# Patient Record
Sex: Female | Born: 1970 | Race: White | Hispanic: No | Marital: Married | Smoking: Never smoker
Health system: Southern US, Community
[De-identification: ages and names within clinical notes are randomized; demographics above are authoritative.]

## PROBLEM LIST (undated history)

## (undated) DIAGNOSIS — Z800 Family history of malignant neoplasm of digestive organs: Secondary | ICD-10-CM

## (undated) DIAGNOSIS — C541 Malignant neoplasm of endometrium: Secondary | ICD-10-CM

## (undated) DIAGNOSIS — C4490 Unspecified malignant neoplasm of skin, unspecified: Secondary | ICD-10-CM

## (undated) DIAGNOSIS — I10 Essential (primary) hypertension: Secondary | ICD-10-CM

## (undated) DIAGNOSIS — F419 Anxiety disorder, unspecified: Secondary | ICD-10-CM

## (undated) DIAGNOSIS — C439 Malignant melanoma of skin, unspecified: Secondary | ICD-10-CM

## (undated) DIAGNOSIS — K589 Irritable bowel syndrome without diarrhea: Secondary | ICD-10-CM

## (undated) DIAGNOSIS — Z803 Family history of malignant neoplasm of breast: Secondary | ICD-10-CM

## (undated) DIAGNOSIS — I739 Peripheral vascular disease, unspecified: Secondary | ICD-10-CM

## (undated) DIAGNOSIS — G4730 Sleep apnea, unspecified: Secondary | ICD-10-CM

## (undated) DIAGNOSIS — M1990 Unspecified osteoarthritis, unspecified site: Secondary | ICD-10-CM

## (undated) DIAGNOSIS — Z8042 Family history of malignant neoplasm of prostate: Secondary | ICD-10-CM

## (undated) DIAGNOSIS — M4800 Spinal stenosis, site unspecified: Secondary | ICD-10-CM

## (undated) DIAGNOSIS — Z808 Family history of malignant neoplasm of other organs or systems: Secondary | ICD-10-CM

## (undated) DIAGNOSIS — Z801 Family history of malignant neoplasm of trachea, bronchus and lung: Secondary | ICD-10-CM

## (undated) HISTORY — DX: Family history of malignant neoplasm of other organs or systems: Z80.8

## (undated) HISTORY — PX: DENTAL SURGERY: SHX609

## (undated) HISTORY — DX: Spinal stenosis, site unspecified: M48.00

## (undated) HISTORY — DX: Unspecified malignant neoplasm of skin, unspecified: C44.90

## (undated) HISTORY — DX: Essential (primary) hypertension: I10

## (undated) HISTORY — DX: Malignant melanoma of skin, unspecified: C43.9

## (undated) HISTORY — DX: Family history of malignant neoplasm of digestive organs: Z80.0

## (undated) HISTORY — DX: Family history of malignant neoplasm of breast: Z80.3

## (undated) HISTORY — DX: Family history of malignant neoplasm of trachea, bronchus and lung: Z80.1

## (undated) HISTORY — DX: Family history of malignant neoplasm of prostate: Z80.42

## (undated) HISTORY — DX: Irritable bowel syndrome, unspecified: K58.9

---

## 2001-09-07 ENCOUNTER — Other Ambulatory Visit: Admission: RE | Admit: 2001-09-07 | Discharge: 2001-09-07 | Payer: Self-pay | Admitting: Obstetrics and Gynecology

## 2003-11-08 ENCOUNTER — Other Ambulatory Visit: Admission: RE | Admit: 2003-11-08 | Discharge: 2003-11-08 | Payer: Self-pay | Admitting: Obstetrics and Gynecology

## 2014-07-23 ENCOUNTER — Other Ambulatory Visit: Payer: Self-pay | Admitting: Obstetrics and Gynecology

## 2014-07-23 DIAGNOSIS — R928 Other abnormal and inconclusive findings on diagnostic imaging of breast: Secondary | ICD-10-CM

## 2014-08-01 ENCOUNTER — Ambulatory Visit
Admission: RE | Admit: 2014-08-01 | Discharge: 2014-08-01 | Disposition: A | Discharge status: Home / Self Care | Payer: Federal, State, Local not specified - PPO | Source: Ambulatory Visit | Attending: Gynecology | Admitting: Gynecology

## 2014-08-01 ENCOUNTER — Encounter (INDEPENDENT_AMBULATORY_CARE_PROVIDER_SITE_OTHER): Payer: Self-pay

## 2014-08-01 ENCOUNTER — Ambulatory Visit: Payer: Self-pay

## 2014-08-01 ENCOUNTER — Ambulatory Visit: Payer: Federal, State, Local not specified - PPO

## 2014-08-01 ENCOUNTER — Other Ambulatory Visit: Payer: Self-pay | Admitting: Gynecology

## 2014-08-01 DIAGNOSIS — R928 Other abnormal and inconclusive findings on diagnostic imaging of breast: Secondary | ICD-10-CM

## 2015-01-23 ENCOUNTER — Other Ambulatory Visit: Payer: Self-pay | Admitting: Gynecology

## 2015-01-23 DIAGNOSIS — N6320 Unspecified lump in the left breast, unspecified quadrant: Secondary | ICD-10-CM

## 2015-02-05 ENCOUNTER — Other Ambulatory Visit: Payer: Federal, State, Local not specified - PPO

## 2015-02-13 ENCOUNTER — Ambulatory Visit
Admission: RE | Admit: 2015-02-13 | Discharge: 2015-02-13 | Disposition: A | Discharge status: Home / Self Care | Payer: Federal, State, Local not specified - PPO | Source: Ambulatory Visit | Attending: Gynecology | Admitting: Gynecology

## 2015-02-13 DIAGNOSIS — N6320 Unspecified lump in the left breast, unspecified quadrant: Secondary | ICD-10-CM

## 2015-02-20 ENCOUNTER — Other Ambulatory Visit: Payer: Self-pay | Admitting: Obstetrics and Gynecology

## 2015-02-20 DIAGNOSIS — N6320 Unspecified lump in the left breast, unspecified quadrant: Secondary | ICD-10-CM

## 2015-03-06 ENCOUNTER — Ambulatory Visit
Admission: RE | Admit: 2015-03-06 | Discharge: 2015-03-06 | Disposition: A | Discharge status: Home / Self Care | Payer: Federal, State, Local not specified - PPO | Source: Ambulatory Visit | Attending: Obstetrics and Gynecology | Admitting: Obstetrics and Gynecology

## 2015-03-06 DIAGNOSIS — N6320 Unspecified lump in the left breast, unspecified quadrant: Secondary | ICD-10-CM

## 2015-03-06 HISTORY — PX: BREAST BIOPSY: SHX20

## 2015-07-15 DIAGNOSIS — L812 Freckles: Secondary | ICD-10-CM | POA: Diagnosis not present

## 2015-07-15 DIAGNOSIS — L821 Other seborrheic keratosis: Secondary | ICD-10-CM | POA: Diagnosis not present

## 2015-07-15 DIAGNOSIS — D485 Neoplasm of uncertain behavior of skin: Secondary | ICD-10-CM | POA: Diagnosis not present

## 2015-07-15 DIAGNOSIS — Z85820 Personal history of malignant melanoma of skin: Secondary | ICD-10-CM | POA: Diagnosis not present

## 2015-07-15 DIAGNOSIS — D1801 Hemangioma of skin and subcutaneous tissue: Secondary | ICD-10-CM | POA: Diagnosis not present

## 2015-08-01 DIAGNOSIS — D485 Neoplasm of uncertain behavior of skin: Secondary | ICD-10-CM | POA: Diagnosis not present

## 2015-08-07 DIAGNOSIS — L080 Pyoderma: Secondary | ICD-10-CM | POA: Diagnosis not present

## 2015-08-15 DIAGNOSIS — L905 Scar conditions and fibrosis of skin: Secondary | ICD-10-CM | POA: Diagnosis not present

## 2015-12-11 DIAGNOSIS — I10 Essential (primary) hypertension: Secondary | ICD-10-CM | POA: Diagnosis not present

## 2015-12-11 DIAGNOSIS — Z23 Encounter for immunization: Secondary | ICD-10-CM | POA: Diagnosis not present

## 2015-12-17 DIAGNOSIS — I10 Essential (primary) hypertension: Secondary | ICD-10-CM | POA: Diagnosis not present

## 2015-12-22 DIAGNOSIS — L905 Scar conditions and fibrosis of skin: Secondary | ICD-10-CM | POA: Diagnosis not present

## 2015-12-22 DIAGNOSIS — D2271 Melanocytic nevi of right lower limb, including hip: Secondary | ICD-10-CM | POA: Diagnosis not present

## 2016-01-29 ENCOUNTER — Other Ambulatory Visit: Payer: Self-pay | Admitting: Gynecology

## 2016-01-29 DIAGNOSIS — N63 Unspecified lump in unspecified breast: Secondary | ICD-10-CM

## 2016-02-12 ENCOUNTER — Other Ambulatory Visit: Payer: Federal, State, Local not specified - PPO

## 2016-02-16 DIAGNOSIS — D225 Melanocytic nevi of trunk: Secondary | ICD-10-CM | POA: Diagnosis not present

## 2016-02-16 DIAGNOSIS — Z85820 Personal history of malignant melanoma of skin: Secondary | ICD-10-CM | POA: Diagnosis not present

## 2016-02-16 DIAGNOSIS — D2271 Melanocytic nevi of right lower limb, including hip: Secondary | ICD-10-CM | POA: Diagnosis not present

## 2016-02-16 DIAGNOSIS — L821 Other seborrheic keratosis: Secondary | ICD-10-CM | POA: Diagnosis not present

## 2016-02-16 DIAGNOSIS — D2371 Other benign neoplasm of skin of right lower limb, including hip: Secondary | ICD-10-CM | POA: Diagnosis not present

## 2016-02-18 ENCOUNTER — Other Ambulatory Visit: Payer: Self-pay | Admitting: Gynecology

## 2016-02-18 ENCOUNTER — Ambulatory Visit
Admission: RE | Admit: 2016-02-18 | Discharge: 2016-02-18 | Disposition: A | Discharge status: Home / Self Care | Payer: Federal, State, Local not specified - PPO | Source: Ambulatory Visit | Attending: Gynecology | Admitting: Gynecology

## 2016-02-18 DIAGNOSIS — N6320 Unspecified lump in the left breast, unspecified quadrant: Secondary | ICD-10-CM

## 2016-02-18 DIAGNOSIS — N63 Unspecified lump in unspecified breast: Secondary | ICD-10-CM

## 2016-03-02 ENCOUNTER — Other Ambulatory Visit: Payer: Federal, State, Local not specified - PPO

## 2016-03-03 ENCOUNTER — Other Ambulatory Visit: Payer: Self-pay | Admitting: Gynecology

## 2016-03-03 DIAGNOSIS — N6320 Unspecified lump in the left breast, unspecified quadrant: Secondary | ICD-10-CM

## 2016-03-04 ENCOUNTER — Ambulatory Visit
Admission: RE | Admit: 2016-03-04 | Discharge: 2016-03-04 | Disposition: A | Discharge status: Home / Self Care | Payer: Federal, State, Local not specified - PPO | Source: Ambulatory Visit | Attending: Gynecology | Admitting: Gynecology

## 2016-03-04 DIAGNOSIS — N6320 Unspecified lump in the left breast, unspecified quadrant: Secondary | ICD-10-CM | POA: Diagnosis not present

## 2016-03-04 DIAGNOSIS — N62 Hypertrophy of breast: Secondary | ICD-10-CM | POA: Diagnosis not present

## 2016-03-04 HISTORY — PX: BREAST BIOPSY: SHX20

## 2016-07-15 DIAGNOSIS — K080 Exfoliation of teeth due to systemic causes: Secondary | ICD-10-CM | POA: Diagnosis not present

## 2016-08-26 DIAGNOSIS — D485 Neoplasm of uncertain behavior of skin: Secondary | ICD-10-CM | POA: Diagnosis not present

## 2016-09-07 DIAGNOSIS — K080 Exfoliation of teeth due to systemic causes: Secondary | ICD-10-CM | POA: Diagnosis not present

## 2016-09-16 DIAGNOSIS — J029 Acute pharyngitis, unspecified: Secondary | ICD-10-CM | POA: Diagnosis not present

## 2016-09-23 DIAGNOSIS — D1801 Hemangioma of skin and subcutaneous tissue: Secondary | ICD-10-CM | POA: Diagnosis not present

## 2016-09-23 DIAGNOSIS — L821 Other seborrheic keratosis: Secondary | ICD-10-CM | POA: Diagnosis not present

## 2016-09-23 DIAGNOSIS — L814 Other melanin hyperpigmentation: Secondary | ICD-10-CM | POA: Diagnosis not present

## 2016-09-23 DIAGNOSIS — D239 Other benign neoplasm of skin, unspecified: Secondary | ICD-10-CM | POA: Diagnosis not present

## 2016-12-01 DIAGNOSIS — R609 Edema, unspecified: Secondary | ICD-10-CM | POA: Diagnosis not present

## 2016-12-01 DIAGNOSIS — G629 Polyneuropathy, unspecified: Secondary | ICD-10-CM | POA: Diagnosis not present

## 2016-12-01 DIAGNOSIS — Z23 Encounter for immunization: Secondary | ICD-10-CM | POA: Diagnosis not present

## 2016-12-01 DIAGNOSIS — R079 Chest pain, unspecified: Secondary | ICD-10-CM | POA: Diagnosis not present

## 2016-12-09 ENCOUNTER — Other Ambulatory Visit: Payer: Self-pay | Admitting: Gynecology

## 2016-12-09 DIAGNOSIS — N63 Unspecified lump in unspecified breast: Secondary | ICD-10-CM

## 2017-01-07 ENCOUNTER — Other Ambulatory Visit: Payer: Federal, State, Local not specified - PPO

## 2017-01-13 ENCOUNTER — Ambulatory Visit
Admission: RE | Admit: 2017-01-13 | Discharge: 2017-01-13 | Disposition: A | Discharge status: Home / Self Care | Payer: Federal, State, Local not specified - PPO | Source: Ambulatory Visit | Attending: Gynecology | Admitting: Gynecology

## 2017-01-13 DIAGNOSIS — N63 Unspecified lump in unspecified breast: Secondary | ICD-10-CM

## 2017-01-13 DIAGNOSIS — R922 Inconclusive mammogram: Secondary | ICD-10-CM | POA: Diagnosis not present

## 2017-01-13 DIAGNOSIS — N6489 Other specified disorders of breast: Secondary | ICD-10-CM | POA: Diagnosis not present

## 2017-01-27 DIAGNOSIS — E538 Deficiency of other specified B group vitamins: Secondary | ICD-10-CM | POA: Diagnosis not present

## 2017-01-27 DIAGNOSIS — R748 Abnormal levels of other serum enzymes: Secondary | ICD-10-CM | POA: Diagnosis not present

## 2017-01-27 DIAGNOSIS — G629 Polyneuropathy, unspecified: Secondary | ICD-10-CM | POA: Diagnosis not present

## 2017-02-14 DIAGNOSIS — K644 Residual hemorrhoidal skin tags: Secondary | ICD-10-CM | POA: Diagnosis not present

## 2017-02-22 DIAGNOSIS — K080 Exfoliation of teeth due to systemic causes: Secondary | ICD-10-CM | POA: Diagnosis not present

## 2017-03-25 ENCOUNTER — Other Ambulatory Visit (HOSPITAL_COMMUNITY): Payer: Self-pay | Admitting: Diagnostic Radiology

## 2017-03-25 DIAGNOSIS — Z1231 Encounter for screening mammogram for malignant neoplasm of breast: Secondary | ICD-10-CM

## 2017-03-30 DIAGNOSIS — J069 Acute upper respiratory infection, unspecified: Secondary | ICD-10-CM | POA: Diagnosis not present

## 2017-03-31 ENCOUNTER — Ambulatory Visit
Admission: RE | Admit: 2017-03-31 | Discharge: 2017-03-31 | Disposition: A | Discharge status: Home / Self Care | Payer: Federal, State, Local not specified - PPO | Source: Ambulatory Visit | Attending: Diagnostic Radiology | Admitting: Diagnostic Radiology

## 2017-03-31 DIAGNOSIS — Z1231 Encounter for screening mammogram for malignant neoplasm of breast: Secondary | ICD-10-CM

## 2017-04-06 DIAGNOSIS — J069 Acute upper respiratory infection, unspecified: Secondary | ICD-10-CM | POA: Diagnosis not present

## 2017-04-12 DIAGNOSIS — K080 Exfoliation of teeth due to systemic causes: Secondary | ICD-10-CM | POA: Diagnosis not present

## 2017-05-03 DIAGNOSIS — E538 Deficiency of other specified B group vitamins: Secondary | ICD-10-CM | POA: Diagnosis not present

## 2017-05-03 DIAGNOSIS — R609 Edema, unspecified: Secondary | ICD-10-CM | POA: Diagnosis not present

## 2017-05-17 DIAGNOSIS — J029 Acute pharyngitis, unspecified: Secondary | ICD-10-CM | POA: Diagnosis not present

## 2017-05-17 DIAGNOSIS — R609 Edema, unspecified: Secondary | ICD-10-CM | POA: Diagnosis not present

## 2017-06-08 ENCOUNTER — Ambulatory Visit (INDEPENDENT_AMBULATORY_CARE_PROVIDER_SITE_OTHER): Payer: Federal, State, Local not specified - PPO | Admitting: Neurology

## 2017-06-08 ENCOUNTER — Encounter: Payer: Self-pay | Admitting: Neurology

## 2017-06-08 ENCOUNTER — Ambulatory Visit: Payer: Federal, State, Local not specified - PPO | Admitting: Neurology

## 2017-06-08 ENCOUNTER — Encounter (INDEPENDENT_AMBULATORY_CARE_PROVIDER_SITE_OTHER): Payer: Self-pay

## 2017-06-08 DIAGNOSIS — R202 Paresthesia of skin: Secondary | ICD-10-CM

## 2017-06-08 NOTE — Procedures (Signed)
     HISTORY:  Tina Ray is a 47 year old patient with a history of some numbness in the toes of the feet bilaterally since April 2018.  Over time, the numbness has spread to include all of the toes, but does not include the foot or ankle or lower leg.  The patient denies any back pain or pain down the legs.  She has not had any balance changes.  She is being evaluated for a possible peripheral neuropathy.  NERVE CONDUCTION STUDIES:  Nerve conduction studies were performed on both lower extremities. The distal motor latencies and motor amplitudes for the peroneal and posterior tibial nerves were within normal limits. The nerve conduction velocities for these nerves were also normal. The H reflex latencies were normal. The sensory latencies for the peroneal and sural nerves were within normal limits.   EMG STUDIES:  EMG study was performed on the right lower extremity:  The tibialis anterior muscle reveals 2 to 4K motor units with full recruitment. No fibrillations or positive waves were seen. The peroneus tertius muscle reveals 2 to 4K motor units with full recruitment. No fibrillations or positive waves were seen. The medial gastrocnemius muscle reveals 1 to 3K motor units with full recruitment. No fibrillations or positive waves were seen. The vastus lateralis muscle reveals 2 to 4K motor units with full recruitment. No fibrillations or positive waves were seen. The iliopsoas muscle reveals 2 to 4K motor units with full recruitment. No fibrillations or positive waves were seen. The biceps femoris muscle (long head) reveals 2 to 4K motor units with full recruitment. No fibrillations or positive waves were seen. The lumbosacral paraspinal muscles were tested at 3 levels, and revealed no abnormalities of insertional activity at all 3 levels tested. There was good relaxation.   IMPRESSION:  Nerve conduction studies done on both lower extremities were within normal limits.  No evidence  of a peripheral neuropathy is seen.  An early peripheral neuropathy however may be missed on standard nerve conduction studies.  Clinical correlation is required.  EMG evaluation of the right lower extremity was unremarkable.  No evidence of an overlying lumbosacral radiculopathy was seen.  Jill Alexanders MD 06/08/2017 5:31 PM  [facility] [address] [place], [place] [address]  Phone [phone] Fax [phone]

## 2017-06-08 NOTE — Progress Notes (Signed)
Please refer to EMG and nerve conduction study procedure note. 

## 2017-06-10 DIAGNOSIS — H9201 Otalgia, right ear: Secondary | ICD-10-CM | POA: Diagnosis not present

## 2017-06-13 DIAGNOSIS — M7989 Other specified soft tissue disorders: Secondary | ICD-10-CM | POA: Diagnosis not present

## 2017-06-13 NOTE — Progress Notes (Signed)
        Nerve / Sites Muscle Latency Ref. Amplitude Ref. Rel Amp Segments Distance Velocity Ref. Area    ms ms mV mV %  cm m/s m/s mVms  R Peroneal - EDB     Ankle EDB 4.2 ?6.5 11.3 ?2.0 100 Ankle - EDB 9   33.5     Fib head EDB 9.3  11.5  102 Fib head - Ankle 26 51 ?44 35.2     Pop fossa EDB 11.1  10.2  88.6 Pop fossa - Fib head 10 53 ?44 32.5         Pop fossa - Ankle      L Peroneal - EDB     Ankle EDB 4.0 ?6.5 8.4 ?2.0 100 Ankle - EDB 9   29.5     Fib head EDB 9.2  8.4  99.7 Fib head - Ankle 26 50 ?44 31.0     Pop fossa EDB 11.3  8.1  96.3 Pop fossa - Fib head 10 47 ?44 29.5         Pop fossa - Ankle      R Tibial - AH     Ankle AH 4.0 ?5.8 10.6 ?4.0 100 Ankle - AH 9   32.2     Pop fossa AH 11.0  10.6  100 Pop fossa - Ankle 32 46 ?41 31.6  L Tibial - AH     Ankle AH 4.2 ?5.8 8.9 ?4.0 100 Ankle - AH 9   28.3     Pop fossa AH 11.6  6.6  74.9 Pop fossa - Ankle 32 43 ?41 28.4             SNC    Nerve / Sites Rec. Site Peak Lat Ref.  Amp Ref. Segments Distance    ms ms V V  cm  L Sural - Ankle (Calf)     Calf Ankle 3.7 ?4.4 8 ?6 Calf - Ankle 14  R Sural - Ankle (Calf)     Calf Ankle 3.9 ?4.4 10 ?6 Calf - Ankle 14  R Superficial peroneal - Ankle     Lat leg Ankle 3.6 ?4.4 8 ?6 Lat leg - Ankle 14  L Superficial peroneal - Ankle     Lat leg Ankle 3.5 ?4.4 6 ?6 Lat leg - Ankle 14              F  Wave    Nerve F Lat Ref.   ms ms  R Tibial - AH 45.9 ?56.0  L Tibial - AH 46.9 ?56.0         EMG full

## 2017-06-28 DIAGNOSIS — Z13220 Encounter for screening for lipoid disorders: Secondary | ICD-10-CM | POA: Diagnosis not present

## 2017-06-28 DIAGNOSIS — Z124 Encounter for screening for malignant neoplasm of cervix: Secondary | ICD-10-CM | POA: Diagnosis not present

## 2017-06-28 DIAGNOSIS — Z6836 Body mass index (BMI) 36.0-36.9, adult: Secondary | ICD-10-CM | POA: Diagnosis not present

## 2017-06-28 DIAGNOSIS — Z130 Encounter for screening for diseases of the blood and blood-forming organs and certain disorders involving the immune mechanism: Secondary | ICD-10-CM | POA: Diagnosis not present

## 2017-06-28 DIAGNOSIS — N921 Excessive and frequent menstruation with irregular cycle: Secondary | ICD-10-CM | POA: Diagnosis not present

## 2017-06-28 DIAGNOSIS — Z1389 Encounter for screening for other disorder: Secondary | ICD-10-CM | POA: Diagnosis not present

## 2017-06-28 DIAGNOSIS — Z131 Encounter for screening for diabetes mellitus: Secondary | ICD-10-CM | POA: Diagnosis not present

## 2017-06-28 DIAGNOSIS — Z01419 Encounter for gynecological examination (general) (routine) without abnormal findings: Secondary | ICD-10-CM | POA: Diagnosis not present

## 2017-07-04 DIAGNOSIS — Z0000 Encounter for general adult medical examination without abnormal findings: Secondary | ICD-10-CM | POA: Diagnosis not present

## 2017-07-11 DIAGNOSIS — N939 Abnormal uterine and vaginal bleeding, unspecified: Secondary | ICD-10-CM | POA: Diagnosis not present

## 2017-07-11 DIAGNOSIS — C50919 Malignant neoplasm of unspecified site of unspecified female breast: Secondary | ICD-10-CM | POA: Diagnosis not present

## 2017-07-12 ENCOUNTER — Encounter: Payer: Federal, State, Local not specified - PPO | Admitting: Neurology

## 2017-07-18 DIAGNOSIS — H9201 Otalgia, right ear: Secondary | ICD-10-CM | POA: Diagnosis not present

## 2017-07-18 DIAGNOSIS — R609 Edema, unspecified: Secondary | ICD-10-CM | POA: Diagnosis not present

## 2017-07-18 DIAGNOSIS — H6061 Unspecified chronic otitis externa, right ear: Secondary | ICD-10-CM | POA: Diagnosis not present

## 2017-07-18 DIAGNOSIS — H60501 Unspecified acute noninfective otitis externa, right ear: Secondary | ICD-10-CM | POA: Diagnosis not present

## 2017-08-01 DIAGNOSIS — H6091 Unspecified otitis externa, right ear: Secondary | ICD-10-CM | POA: Diagnosis not present

## 2017-08-01 DIAGNOSIS — H93293 Other abnormal auditory perceptions, bilateral: Secondary | ICD-10-CM | POA: Diagnosis not present

## 2017-08-10 DIAGNOSIS — H6093 Unspecified otitis externa, bilateral: Secondary | ICD-10-CM | POA: Diagnosis not present

## 2017-08-10 DIAGNOSIS — Z7289 Other problems related to lifestyle: Secondary | ICD-10-CM | POA: Diagnosis not present

## 2017-08-10 DIAGNOSIS — H6123 Impacted cerumen, bilateral: Secondary | ICD-10-CM | POA: Diagnosis not present

## 2017-08-16 DIAGNOSIS — N939 Abnormal uterine and vaginal bleeding, unspecified: Secondary | ICD-10-CM | POA: Diagnosis not present

## 2017-08-16 DIAGNOSIS — C541 Malignant neoplasm of endometrium: Secondary | ICD-10-CM | POA: Diagnosis not present

## 2017-08-16 DIAGNOSIS — C50919 Malignant neoplasm of unspecified site of unspecified female breast: Secondary | ICD-10-CM | POA: Diagnosis not present

## 2017-08-22 DIAGNOSIS — H93293 Other abnormal auditory perceptions, bilateral: Secondary | ICD-10-CM | POA: Diagnosis not present

## 2017-08-22 DIAGNOSIS — H6063 Unspecified chronic otitis externa, bilateral: Secondary | ICD-10-CM | POA: Diagnosis not present

## 2017-08-23 ENCOUNTER — Inpatient Hospital Stay: Payer: Federal, State, Local not specified - PPO | Attending: Gynecologic Oncology | Admitting: Gynecologic Oncology

## 2017-08-23 ENCOUNTER — Encounter: Payer: Self-pay | Admitting: Oncology

## 2017-08-23 ENCOUNTER — Encounter: Payer: Self-pay | Admitting: Gynecologic Oncology

## 2017-08-23 ENCOUNTER — Telehealth: Payer: Self-pay | Admitting: *Deleted

## 2017-08-23 VITALS — BP 131/78 | HR 104 | Temp 98.3°F | Resp 20 | Ht 62.5 in | Wt 204.2 lb

## 2017-08-23 DIAGNOSIS — C541 Malignant neoplasm of endometrium: Secondary | ICD-10-CM | POA: Diagnosis not present

## 2017-08-23 NOTE — Telephone Encounter (Signed)
Called and left a message for the patient with her post op appt

## 2017-08-23 NOTE — Patient Instructions (Signed)
Preparing for your Surgery  Plan for surgery on September 12, 2017 with Dr. Everitt Amber at [place] will be scheduled for a robotic assisted total hysterectomy, bilateral salpingo-oophorectomy, sentinel lymph node biopsy.   Plan on having a CT scan before surgery and we will contact you with the results.   Pre-operative Testing -You will receive a phone call from presurgical testing at [facility] to arrange for a pre-operative testing appointment before your surgery.  This appointment normally occurs one to two weeks before your scheduled surgery.   -Bring your insurance card, copy of an advanced directive if applicable, medication list  -At that visit, you will be asked to sign a consent for a possible blood transfusion in case a transfusion becomes necessary during surgery.  The need for a blood transfusion is rare but having consent is a necessary part of your care.     -You should not be taking blood thinners or aspirin at least ten days prior to surgery unless instructed by your surgeon.  Day Before Surgery at [place] will be asked to take in a light diet the day before surgery.  Avoid carbonated beverages.  You will be advised to have nothing to eat or drink after midnight the evening before.    Eat a light diet the day before surgery.  Examples including soups, broths, toast, yogurt, mashed potatoes.  Things to avoid include carbonated beverages (fizzy beverages), raw fruits and raw vegetables, or beans.   If your bowels are filled with gas, your surgeon will have difficulty visualizing your pelvic organs which increases your surgical risks.  Your role in recovery Your role is to become active as soon as directed by your doctor, while still giving yourself time to heal.  Rest when you feel tired. You will be asked to do the following in order to speed your recovery:  - Cough and breathe deeply. This helps toclear and expand your lungs and can  prevent pneumonia. You may be given a spirometer to practice deep breathing. A staff member will show you how to use the spirometer. - Do mild physical activity. Walking or moving your legs help your circulation and body functions return to normal. A staff member will help you when you try to walk and will provide you with simple exercises. Do not try to get up or walk alone the first time. - Actively manage your pain. Managing your pain lets you move in comfort. We will ask you to rate your pain on a scale of zero to 10. It is your responsibility to tell your doctor or nurse where and how much you hurt so your pain can be treated.  Special Considerations -If you are diabetic, you may be placed on insulin after surgery to have closer control over your blood sugars to promote healing and recovery.  This does not mean that you will be discharged on insulin.  If applicable, your oral antidiabetics will be resumed when you are tolerating a solid diet.  -Your final pathology results from surgery should be available by the Friday after surgery and the results will be relayed to you when available.  -Dr. Lahoma Crocker is the Surgeon that assists your GYN Oncologist with surgery.  The next day after your surgery you will either see your GYN Oncologist or Dr. Lahoma Crocker.   Blood Transfusion Information WHAT IS A BLOOD TRANSFUSION? A transfusion is the replacement of blood or some of its parts. Blood is made  up of multiple cells which provide different functions.  Red blood cells carry oxygen and are used for blood loss replacement.  White blood cells fight against infection.  Platelets control bleeding.  Plasma helps clot blood.  Other blood products are available for specialized needs, such as hemophilia or other clotting disorders. BEFORE THE TRANSFUSION  Who gives blood for transfusions?   You may be able to donate blood to be used at a later date on yourself (autologous  donation).  Relatives can be asked to donate blood. This is generally not any safer than if you have received blood from a stranger. The same precautions are taken to ensure safety when a relative's blood is donated.  Healthy volunteers who are fully evaluated to make sure their blood is safe. This is blood bank blood. Transfusion therapy is the safest it has ever been in the practice of medicine. Before blood is taken from a donor, a complete history is taken to make sure that person has no history of diseases nor engages in risky social behavior (examples are intravenous drug use or sexual activity with multiple partners). The donor's travel history is screened to minimize risk of transmitting infections, such as malaria. The donated blood is tested for signs of infectious diseases, such as HIV and hepatitis. The blood is then tested to be sure it is compatible with you in order to minimize the chance of a transfusion reaction. If you or a relative donates blood, this is often done in anticipation of surgery and is not appropriate for emergency situations. It takes many days to process the donated blood. RISKS AND COMPLICATIONS Although transfusion therapy is very safe and saves many lives, the main dangers of transfusion include:   Getting an infectious disease.  Developing a transfusion reaction. This is an allergic reaction to something in the blood you were given. Every precaution is taken to prevent this. The decision to have a blood transfusion has been considered carefully by your caregiver before blood is given. Blood is not given unless the benefits outweigh the risks.

## 2017-08-23 NOTE — H&P (View-Only) (Signed)
Consult Note: Gyn-Onc  Consult was requested by Dr. Nile Dear for the evaluation of [name] 47 y.o. female  CC:  Chief Complaint  Patient presents with  . Adenocarcinoma of endometrium [facility])    Assessment/Plan:  Ms. Tina Ray  is a 47 y.o.  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  A detailed discussion was held with the patient and her family with regard to to her endometrial cancer diagnosis. We discussed the standard management options for uterine cancer which includes surgery followed possibly by adjuvant therapy depending on the results of surgery. The options for surgical management include a hysterectomy and removal of the tubes and ovaries possibly with removal of pelvic and para-aortic lymph nodes.If feasible, a minimally invasive approach including a robotic hysterectomy or laparoscopic hysterectomy have benefits including shorter hospital stay, recovery time and better wound healing than with open surgery. The patient has been counseled about these surgical options and the risks of surgery in general including infection, bleeding, damage to surrounding structures (including bowel, bladder, ureters, nerves or vessels), and the postoperative risks of PE/ DVT, and lymphedema. I extensively reviewed the additional risks of robotic hysterectomy including possible need for conversion to open laparotomy.  I discussed positioning during surgery of trendelenberg and risks of minor facial swelling and care we take in preoperative positioning.  After counseling and consideration of her options, she desires to proceed with robotic assisted total hysterectomy with bilateral sapingo-oophorectomy and SLN biopsy.   She will be seen by anesthesia for preoperative clearance and discussion of postoperative pain management.  She was given the opportunity to ask questions, which were answered to her satisfaction, and she is agreement with the above mentioned  plan of care.  Due to the focus of serous carcinoma in the biopsy, I counseled the patient that she may require adjuvant therapy postop. We will obtain a preop CT abd/pelvis in accordance with NCCN recommendations to evaluate for gross metastatic extrauterine disease.   She was counseled regarding the sterilizing nature of hysterectomy, and that surgical menopause will be induced with BSO. We will prescribe estrogen replacement therapy postop on a prn basis.   HPI: Tina Ray is a 47 year old P0 who is seen in consultation at the request of Dr Melba Coon for grade 1 endometrial cancer.   The patient has a history of previously normal regular menstrual cycles up until December 2018 when she began experiencing intermenstrual bleeding.  She then discussed this with her OB/GYN during in May 2019 routine visit.  This prompted a transvaginal ultrasound scan which identified a 1 cm endometrial polyp.  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  The patient is nulliparous as her husband had had a prior vasectomy.  Patient has a history of 2 prior melanoma surgeries including one in her 75s.  Her family history is significant for a paternal grandmother with breast cancer at age 51, father with prostate cancer, and a maternal grandfather and 3 additional people on her mother side of the family with melanoma history.  The patient has had no prior abdominal surgeries.  She has hypertension but no diabetes mellitus.  She is overweight and weighs 204 pounds.  Current Meds:  Outpatient Encounter Medications as of 08/23/2017  Medication Sig  . acetic acid-hydrocortisone (VOSOL-HC) OTIC solution PLACE 4 DROPS INTO BOTH EARS EVERY EVENING FOR 7 DAYS THEN AS  NEEDED FOR ITCHING IN THE EAR CANALS.  Marland Kitchen acyclovir (ZOVIRAX) 400 MG tablet 1 TABLET THREE TIMES A DAY AS NEEDED ORALLY 20  . fluticasone  (FLONASE) 50 MCG/ACT nasal spray fluticasone propionate 50 mcg/actuation nasal spray,suspension  . lisinopril-hydrochlorothiazide (PRINZIDE,ZESTORETIC) 20-25 MG tablet Take 1 tablet by mouth daily.  Marland Kitchen loratadine-pseudoephedrine (CLARITIN-D 24-HOUR) 10-240 MG 24 hr tablet Take 1 tablet by mouth as needed for allergies.  . vitamin B-12 (CYANOCOBALAMIN) 250 MCG tablet Take 250 mcg by mouth as needed. B-12 250 mcg tablet. Take by oral route. Patient takes as needed.   No facility-administered encounter medications on file as of 08/23/2017.     Allergy:  Allergies  Allergen Reactions  . Erythromycin Base Hives  . Sulfa Antibiotics Hives    Social Hx:   Social History   Socioeconomic History  . Marital status: Married    Spouse name: Not on file  . Number of children: Not on file  . Years of education: Not on file  . Highest education level: Not on file  Occupational History  . Not on file  Social Needs  . Financial resource strain: Not on file  . Food insecurity:    Worry: Not on file    Inability: Not on file  . Transportation needs:    Medical: Not on file    Non-medical: Not on file  Tobacco Use  . Smoking status: Never Smoker  . Smokeless tobacco: Never Used  Substance and Sexual Activity  . Alcohol use: Yes    Alcohol/week: 1.8 oz    Types: 3 Glasses of wine per week    Comment: 2 or 3 classes of wine daily  . Drug use: Never  . Sexual activity: Not on file  Lifestyle  . Physical activity:    Days per week: Not on file    Minutes per session: Not on file  . Stress: Not on file  Relationships  . Social connections:    Talks on phone: Not on file    Gets together: Not on file    Attends religious service: Not on file    Active member of club or organization: Not on file    Attends meetings of clubs or organizations: Not on file    Relationship status: Not on file  . Intimate partner violence:    Fear of current or ex partner: Not on file    Emotionally abused:  Not on file    Physically abused: Not on file    Forced sexual activity: Not on file  Other Topics Concern  . Not on file  Social History Narrative  . Not on file    Past Surgical Hx:  Past Surgical History:  Procedure Laterality Date  . BREAST BIOPSY Left 03/04/2016  . BREAST BIOPSY Left 03/06/2015  . DENTAL SURGERY     wisedom tooth extraction    Past Medical Hx:  Past Medical History:  Diagnosis Date  . Hypertension   . IBS (irritable bowel syndrome)   . Skin cancer   . Skin cancer (melanoma) ([place])    1999    Past Gynecological History:   No LMP recorded. (Menstrual status: Irregular Periods).  Family Hx:  Family History  Problem Relation Age of Onset  . Breast cancer Paternal Grandmother   . Hypercholesterolemia Mother   . Diabetes Father   . Multiple sclerosis Father   . Bone cancer Father   . Colon cancer Father   . Prostate cancer Father   . Diabetes  Maternal Aunt   . Diabetes Maternal Uncle   . Hypertension Maternal Grandmother   . Diabetes Maternal Grandmother   . Diabetes Maternal Grandfather     Review of Systems:  Constitutional  Feels well,    ENT Normal appearing ears and nares bilaterally Skin/Breast  No rash, sores, jaundice, itching, dryness Cardiovascular  No chest pain, shortness of breath, or edema  Pulmonary  No cough or wheeze.  Gastro Intestinal  No nausea, vomitting, or diarrhoea. No bright red blood per rectum, no abdominal pain, change in bowel movement, or constipation.  Genito Urinary  No frequency, urgency, dysuria, + abnormal uterine bleeding Musculo Skeletal  No myalgia, arthralgia, joint swelling or pain  Neurologic  No weakness, numbness, change in gait,  Psychology  No depression, anxiety, insomnia.   Vitals:  Blood pressure 131/78, pulse (!) 104, temperature 98.3 F (36.8 C), temperature source Oral, resp. rate 20, height 5' 2.5" (1.588 m), weight 204 lb 3.2 oz (92.6 kg), SpO2 100 %.  Physical Exam: WD in  NAD Neck  Supple NROM, without any enlargements.  Lymph Node Survey No cervical supraclavicular or inguinal adenopathy Cardiovascular  Pulse normal rate, regularity and rhythm. S1 and S2 normal.  Lungs  Clear to auscultation bilateraly, without wheezes/crackles/rhonchi. Good air movement.  Skin  No rash/lesions/breakdown  Psychiatry  Alert and oriented to person, place, and time  Abdomen  Normoactive bowel sounds, abdomen soft, non-tender and overweight without evidence of hernia.  Back No CVA tenderness Genito Urinary  Vulva/vagina: Normal external female genitalia.   No lesions. No discharge or bleeding.  Bladder/urethra:  No lesions or masses, well supported bladder  Vagina: normal  Cervix: Normal appearing, no lesions.  Uterus:  Small, mobile, no parametrial involvement or nodularity.  Adnexa: no palpable masses. Rectal  deferred Extremities  No bilateral cyanosis, clubbing or edema.   Thereasa Solo, MD  08/23/2017, 3:29 PM

## 2017-08-23 NOTE — Progress Notes (Signed)
Consult Note: Gyn-Onc  Consult was requested by Dr. Nile Dear for the evaluation of Tina Ray 47 y.o. female  CC:  Chief Complaint  Patient presents with  . Adenocarcinoma of endometrium [facility])    Assessment/Plan:  Tina Ray  is a 47 y.o.  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  A detailed discussion was held with the patient and her family with regard to to her endometrial cancer diagnosis. We discussed the standard management options for uterine cancer which includes surgery followed possibly by adjuvant therapy depending on the results of surgery. The options for surgical management include a hysterectomy and removal of the tubes and ovaries possibly with removal of pelvic and para-aortic lymph nodes.If feasible, a minimally invasive approach including a robotic hysterectomy or laparoscopic hysterectomy have benefits including shorter hospital stay, recovery time and better wound healing than with open surgery. The patient has been counseled about these surgical options and the risks of surgery in general including infection, bleeding, damage to surrounding structures (including bowel, bladder, ureters, nerves or vessels), and the postoperative risks of PE/ DVT, and lymphedema. I extensively reviewed the additional risks of robotic hysterectomy including possible need for conversion to open laparotomy.  I discussed positioning during surgery of trendelenberg and risks of minor facial swelling and care we take in preoperative positioning.  After counseling and consideration of her options, she desires to proceed with robotic assisted total hysterectomy with bilateral sapingo-oophorectomy and SLN biopsy.   She will be seen by anesthesia for preoperative clearance and discussion of postoperative pain management.  She was given the opportunity to ask questions, which were answered to her satisfaction, and she is agreement with the above mentioned  plan of care.  Due to the focus of serous carcinoma in the biopsy, I counseled the patient that she may require adjuvant therapy postop. We will obtain a preop CT abd/pelvis in accordance with NCCN recommendations to evaluate for gross metastatic extrauterine disease.   She was counseled regarding the sterilizing nature of hysterectomy, and that surgical menopause will be induced with BSO. We will prescribe estrogen replacement therapy postop on a prn basis.   HPI: Tina Ray is a 47 year old P0 who is seen in consultation at the request of Dr Melba Coon for grade 1 endometrial cancer.   The patient has a history of previously normal regular menstrual cycles up until December 2018 when she began experiencing intermenstrual bleeding.  She then discussed this with her OB/GYN during in May 2019 routine visit.  This prompted a transvaginal ultrasound scan which identified a 1 cm endometrial polyp.  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  The patient is nulliparous as her husband had had a prior vasectomy.  Patient has a history of 2 prior melanoma surgeries including one in her 43s.  Her family history is significant for a paternal grandmother with breast cancer at age 44, father with prostate cancer, and a maternal grandfather and 3 additional people on her mother side of the family with melanoma history.  The patient has had no prior abdominal surgeries.  She has hypertension but no diabetes mellitus.  She is overweight and weighs 204 pounds.  Current Meds:  Outpatient Encounter Medications as of 08/23/2017  Medication Sig  . acetic acid-hydrocortisone (VOSOL-HC) OTIC solution PLACE 4 DROPS INTO BOTH EARS EVERY EVENING FOR 7 DAYS THEN AS  NEEDED FOR ITCHING IN THE EAR CANALS.  Marland Kitchen acyclovir (ZOVIRAX) 400 MG tablet 1 TABLET THREE TIMES A DAY AS NEEDED ORALLY 20  . fluticasone  (FLONASE) 50 MCG/ACT nasal spray fluticasone propionate 50 mcg/actuation nasal spray,suspension  . lisinopril-hydrochlorothiazide (PRINZIDE,ZESTORETIC) 20-25 MG tablet Take 1 tablet by mouth daily.  Marland Kitchen loratadine-pseudoephedrine (CLARITIN-D 24-HOUR) 10-240 MG 24 hr tablet Take 1 tablet by mouth as needed for allergies.  . vitamin B-12 (CYANOCOBALAMIN) 250 MCG tablet Take 250 mcg by mouth as needed. B-12 250 mcg tablet. Take by oral route. Patient takes as needed.   No facility-administered encounter medications on file as of 08/23/2017.     Allergy:  Allergies  Allergen Reactions  . Erythromycin Base Hives  . Sulfa Antibiotics Hives    Social Hx:   Social History   Socioeconomic History  . Marital status: Married    Spouse name: Not on file  . Number of children: Not on file  . Years of education: Not on file  . Highest education level: Not on file  Occupational History  . Not on file  Social Needs  . Financial resource strain: Not on file  . Food insecurity:    Worry: Not on file    Inability: Not on file  . Transportation needs:    Medical: Not on file    Non-medical: Not on file  Tobacco Use  . Smoking status: Never Smoker  . Smokeless tobacco: Never Used  Substance and Sexual Activity  . Alcohol use: Yes    Alcohol/week: 1.8 oz    Types: 3 Glasses of wine per week    Comment: 2 or 3 classes of wine daily  . Drug use: Never  . Sexual activity: Not on file  Lifestyle  . Physical activity:    Days per week: Not on file    Minutes per session: Not on file  . Stress: Not on file  Relationships  . Social connections:    Talks on phone: Not on file    Gets together: Not on file    Attends religious service: Not on file    Active member of club or organization: Not on file    Attends meetings of clubs or organizations: Not on file    Relationship status: Not on file  . Intimate partner violence:    Fear of current or ex partner: Not on file    Emotionally abused:  Not on file    Physically abused: Not on file    Forced sexual activity: Not on file  Other Topics Concern  . Not on file  Social History Narrative  . Not on file    Past Surgical Hx:  Past Surgical History:  Procedure Laterality Date  . BREAST BIOPSY Left 03/04/2016  . BREAST BIOPSY Left 03/06/2015  . DENTAL SURGERY     wisedom tooth extraction    Past Medical Hx:  Past Medical History:  Diagnosis Date  . Hypertension   . IBS (irritable bowel syndrome)   . Skin cancer   . Skin cancer (melanoma) ([place])    1999    Past Gynecological History:   No LMP recorded. (Menstrual status: Irregular Periods).  Family Hx:  Family History  Problem Relation Age of Onset  . Breast cancer Paternal Grandmother   . Hypercholesterolemia Mother   . Diabetes Father   . Multiple sclerosis Father   . Bone cancer Father   . Colon cancer Father   . Prostate cancer Father   . Diabetes  Maternal Aunt   . Diabetes Maternal Uncle   . Hypertension Maternal Grandmother   . Diabetes Maternal Grandmother   . Diabetes Maternal Grandfather     Review of Systems:  Constitutional  Feels well,    ENT Normal appearing ears and nares bilaterally Skin/Breast  No rash, sores, jaundice, itching, dryness Cardiovascular  No chest pain, shortness of breath, or edema  Pulmonary  No cough or wheeze.  Gastro Intestinal  No nausea, vomitting, or diarrhoea. No bright red blood per rectum, no abdominal pain, change in bowel movement, or constipation.  Genito Urinary  No frequency, urgency, dysuria, + abnormal uterine bleeding Musculo Skeletal  No myalgia, arthralgia, joint swelling or pain  Neurologic  No weakness, numbness, change in gait,  Psychology  No depression, anxiety, insomnia.   Vitals:  Blood pressure 131/78, pulse (!) 104, temperature 98.3 F (36.8 C), temperature source Oral, resp. rate 20, height 5' 2.5" (1.588 m), weight 204 lb 3.2 oz (92.6 kg), SpO2 100 %.  Physical Exam: WD in  NAD Neck  Supple NROM, without any enlargements.  Lymph Node Survey No cervical supraclavicular or inguinal adenopathy Cardiovascular  Pulse normal rate, regularity and rhythm. S1 and S2 normal.  Lungs  Clear to auscultation bilateraly, without wheezes/crackles/rhonchi. Good air movement.  Skin  No rash/lesions/breakdown  Psychiatry  Alert and oriented to person, place, and time  Abdomen  Normoactive bowel sounds, abdomen soft, non-tender and overweight without evidence of hernia.  Back No CVA tenderness Genito Urinary  Vulva/vagina: Normal external female genitalia.   No lesions. No discharge or bleeding.  Bladder/urethra:  No lesions or masses, well supported bladder  Vagina: normal  Cervix: Normal appearing, no lesions.  Uterus:  Small, mobile, no parametrial involvement or nodularity.  Adnexa: no palpable masses. Rectal  deferred Extremities  No bilateral cyanosis, clubbing or edema.   Thereasa Solo, MD  08/23/2017, 3:29 PM

## 2017-08-25 ENCOUNTER — Ambulatory Visit (HOSPITAL_COMMUNITY)
Admission: RE | Admit: 2017-08-25 | Discharge: 2017-08-25 | Disposition: A | Discharge status: Home / Self Care | Payer: Federal, State, Local not specified - PPO | Source: Ambulatory Visit | Attending: Gynecologic Oncology | Admitting: Gynecologic Oncology

## 2017-08-25 DIAGNOSIS — C541 Malignant neoplasm of endometrium: Secondary | ICD-10-CM | POA: Insufficient documentation

## 2017-08-25 MED ORDER — IOPAMIDOL (ISOVUE-300) INJECTION 61%
INTRAVENOUS | Status: AC
  Filled 2017-08-25: qty 100

## 2017-08-25 MED ORDER — IOPAMIDOL (ISOVUE-300) INJECTION 61%
100.0000 mL | Freq: Once | INTRAVENOUS | Status: AC | PRN
  Administered 2017-08-25: 100 mL via INTRAVENOUS

## 2017-08-30 ENCOUNTER — Telehealth: Payer: Self-pay

## 2017-08-30 NOTE — Telephone Encounter (Signed)
Outgoing call to patient regarding recent CT of abd / pelvis results, per Joylene John NP, "no evidence of metastatic disease elsewhere" and then she'll receive the results from pathology regarding the tissue samples from her upcoming surgery. Pt voiced understanding. Pt verbalized pleased with results so far. No other needs per pt at this time.

## 2017-09-02 DIAGNOSIS — M62838 Other muscle spasm: Secondary | ICD-10-CM | POA: Diagnosis not present

## 2017-09-05 NOTE — Patient Instructions (Addendum)
Tina Ray  09/05/2017   Your procedure is scheduled on: 09-12-17  Report to [facility] Main  Entrance    Report to Admitting at 5:30 AM    Call this number if you have problems the morning of surgery [phone]   Remember: NO SOLID FOOD AFTER MIDNIGHT THE NIGHT PRIOR TO SURGERY. NOTHING BY MOUTH EXCEPT CLEAR LIQUIDS UNTIL 3 HOURS PRIOR TO [name] SURGERY. PLEASE FINISH ENSURE DRINK PER SURGEON ORDER 3 HOURS PRIOR TO SCHEDULED SURGERY TIME WHICH NEEDS TO BE COMPLETED AT 4:30.     CLEAR LIQUID DIET   Foods Allowed                                                                     Foods Excluded  Coffee and tea, regular and decaf                             liquids that you cannot  Plain Jell-O in any flavor                                             see through such as: Fruit ices (not with fruit pulp)                                     milk, soups, orange juice  Iced Popsicles                                    All solid food Carbonated beverages, regular and diet                                    Cranberry, grape and apple juices Sports drinks like Gatorade Lightly seasoned clear broth or consume(fat free) Sugar, honey syrup  Sample Menu Breakfast                                Lunch                                     Supper Cranberry juice                    Beef broth                            Chicken broth Jell-O                                     Grape juice  Apple juice Coffee or tea                        Jell-O                                      Popsicle                                                Coffee or tea                        Coffee or tea  _____________________________________________________________________    Eat a light diet the day before surgery.  Examples including soups, broths, toast, yogurt, mashed potatoes.  Things to avoid include carbonated beverages (fizzy beverages), raw fruits  and raw vegetables, or beans.   If your bowels are filled with gas, your surgeon will have difficulty visualizing your pelvic organs which increases your surgical risks.  Take these medicines the morning of surgery with A SIP OF WATER: None                                You may not have any metal on your body including hair pins and              piercings  Do not wear jewelry, make-up, lotions, powders or perfumes, deodorant             Do not wear nail polish.  Do not shave  48 hours prior to surgery.                 Do not bring valuables to the hospital. [facility].  Contacts, dentures or bridgework may not be worn into surgery.      Patients discharged the day of surgery will not be allowed to drive home.  Name and phone number of your driver:  Special Instructions: Cough and Deep Breath Exercises              Please read over the following fact sheets you were given: _____________________________________________________________________         [facility] - Preparing for Surgery Before surgery, you can play an important role.  Because skin is not sterile, your skin needs to be as free of germs as possible.  You can reduce the number of germs on your skin by washing with CHG (chlorahexidine gluconate) soap before surgery.  CHG is an antiseptic cleaner which kills germs and bonds with the skin to continue killing germs even after washing. Please DO NOT use if you have an allergy to CHG or antibacterial soaps.  If your skin becomes reddened/irritated stop using the CHG and inform your nurse when you arrive at Short Stay. Do not shave (including legs and underarms) for at least 48 hours prior to the first CHG shower.  You may shave your face/neck. Please follow these instructions carefully:  1.  Shower with CHG Soap the night before surgery and the  morning of Surgery.  2.  If you choose to wash your hair, wash your  hair first as usual  with your  normal  shampoo.  3.  After you shampoo, rinse your hair and body thoroughly to remove the  shampoo.                           4.  Use CHG as you would any other liquid soap.  You can apply chg directly  to the skin and wash                       Gently with a scrungie or clean washcloth.  5.  Apply the CHG Soap to your body ONLY FROM THE NECK DOWN.   Do not use on face/ open                           Wound or open sores. Avoid contact with eyes, ears mouth and genitals (private parts).                       Wash face,  Genitals (private parts) with your normal soap.             6.  Wash thoroughly, paying special attention to the area where your surgery  will be performed.  7.  Thoroughly rinse your body with warm water from the neck down.  8.  DO NOT shower/wash with your normal soap after using and rinsing off  the CHG Soap.                9.  Pat yourself dry with a clean towel.            10.  Wear clean pajamas.            11.  Place clean sheets on your bed the night of your first shower and do not  sleep with pets. Day of Surgery : Do not apply any lotions/deodorants the morning of surgery.  Please wear clean clothes to the hospital/surgery center.  FAILURE TO FOLLOW THESE INSTRUCTIONS MAY RESULT IN THE CANCELLATION OF YOUR SURGERY PATIENT SIGNATURE_________________________________  NURSE SIGNATURE__________________________________  ________________________________________________________________________   Tina Ray  An incentive spirometer is a tool that can help keep your lungs clear and active. This tool measures how well you are filling your lungs with each breath. Taking long deep breaths may help reverse or decrease the chance of developing breathing (pulmonary) problems (especially infection) following:  A long period of time when you are unable to move or be active. BEFORE THE PROCEDURE   If the spirometer includes an indicator to show your best  effort, your nurse or respiratory therapist will set it to a desired goal.  If possible, sit up straight or lean slightly forward. Try not to slouch.  Hold the incentive spirometer in an upright position. INSTRUCTIONS FOR USE  1. Sit on the edge of your bed if possible, or sit up as far as you can in bed or on a chair. 2. Hold the incentive spirometer in an upright position. 3. Breathe out normally. 4. Place the mouthpiece in your mouth and seal your lips tightly around it. 5. Breathe in slowly and as deeply as possible, raising the piston or the ball toward the top of the column. 6. Hold your breath for 3-5 seconds or for as long as possible. Allow the piston or ball to fall to the bottom of the column. 7.  Remove the mouthpiece from your mouth and breathe out normally. 8. Rest for a few seconds and repeat Steps 1 through 7 at least 10 times every 1-2 hours when you are awake. Take your time and take a few normal breaths between deep breaths. 9. The spirometer may include an indicator to show your best effort. Use the indicator as a goal to work toward during each repetition. 10. After each set of 10 deep breaths, practice coughing to be sure your lungs are clear. If you have an incision (the cut made at the time of surgery), support your incision when coughing by placing a pillow or rolled up towels firmly against it. Once you are able to get out of bed, walk around indoors and cough well. You may stop using the incentive spirometer when instructed by your caregiver.  RISKS AND COMPLICATIONS  Take your time so you do not get dizzy or light-headed.  If you are in pain, you may need to take or ask for pain medication before doing incentive spirometry. It is harder to take a deep breath if you are having pain. AFTER USE  Rest and breathe slowly and easily.  It can be helpful to keep track of a log of your progress. Your caregiver can provide you with a simple table to help with this. If you  are using the spirometer at home, follow these instructions: [place] IF:   You are having difficultly using the spirometer.  You have trouble using the spirometer as often as instructed.  Your pain medication is not giving enough relief while using the spirometer.  You develop fever of 100.5 F (38.1 C) or higher. SEEK IMMEDIATE MEDICAL CARE IF:   You cough up bloody sputum that had not been present before.  You develop fever of 102 F (38.9 C) or greater.  You develop worsening pain at or near the incision site. MAKE SURE YOU:   Understand these instructions.  Will watch your condition.  Will get help right away if you are not doing well or get worse. Document Released: 08/02/2006 Document Revised: 06/14/2011 Document Reviewed: 10/03/2006 ExitCare Patient Information 2014 ExitCare, [place].   ________________________________________________________________________  WHAT IS A BLOOD TRANSFUSION? Blood Transfusion Information  A transfusion is the replacement of blood or some of its parts. Blood is made up of multiple cells which provide different functions.  Red blood cells carry oxygen and are used for blood loss replacement.  White blood cells fight against infection.  Platelets control bleeding.  Plasma helps clot blood.  Other blood products are available for specialized needs, such as hemophilia or other clotting disorders. BEFORE THE TRANSFUSION  Who gives blood for transfusions?   Healthy volunteers who are fully evaluated to make sure their blood is safe. This is blood bank blood. Transfusion therapy is the safest it has ever been in the practice of medicine. Before blood is taken from a donor, a complete history is taken to make sure that person has no history of diseases nor engages in risky social behavior (examples are intravenous drug use or sexual activity with multiple partners). The donor's travel history is screened to minimize risk of  transmitting infections, such as malaria. The donated blood is tested for signs of infectious diseases, such as HIV and hepatitis. The blood is then tested to be sure it is compatible with you in order to minimize the chance of a transfusion reaction. If you or a relative donates blood, this is often done in anticipation  of surgery and is not appropriate for emergency situations. It takes many days to process the donated blood. RISKS AND COMPLICATIONS Although transfusion therapy is very safe and saves many lives, the main dangers of transfusion include:   Getting an infectious disease.  Developing a transfusion reaction. This is an allergic reaction to something in the blood you were given. Every precaution is taken to prevent this. The decision to have a blood transfusion has been considered carefully by your caregiver before blood is given. Blood is not given unless the benefits outweigh the risks. AFTER THE TRANSFUSION  Right after receiving a blood transfusion, you will usually feel much better and more energetic. This is especially true if your red blood cells have gotten low (anemic). The transfusion raises the level of the red blood cells which carry oxygen, and this usually causes an energy increase.  The nurse administering the transfusion will monitor you carefully for complications. HOME CARE INSTRUCTIONS  No special instructions are needed after a transfusion. You may find your energy is better. Speak with your caregiver about any limitations on activity for underlying diseases you may have. SEEK MEDICAL CARE IF:   Your condition is not improving after your transfusion.  You develop redness or irritation at the intravenous (IV) site. SEEK IMMEDIATE MEDICAL CARE IF:  Any of the following symptoms occur over the next 12 hours:  Shaking chills.  You have a temperature by mouth above 102 F (38.9 C), not controlled by medicine.  Chest, back, or muscle pain.  People around you  feel you are not acting correctly or are confused.  Shortness of breath or difficulty breathing.  Dizziness and fainting.  You get a rash or develop hives.  You have a decrease in urine output.  Your urine turns a dark color or changes to pink, red, or brown. Any of the following symptoms occur over the next 10 days:  You have a temperature by mouth above 102 F (38.9 C), not controlled by medicine.  Shortness of breath.  Weakness after normal activity.  The white part of the eye turns yellow (jaundice).  You have a decrease in the amount of urine or are urinating less often.  Your urine turns a dark color or changes to pink, red, or brown. Document Released: 03/19/2000 Document Revised: 06/14/2011 Document Reviewed: 11/06/2007 [facility] Patient Information 2014 [place], [place].  _______________________________________________________________________

## 2017-09-06 ENCOUNTER — Encounter (HOSPITAL_COMMUNITY)
Admission: RE | Admit: 2017-09-06 | Discharge: 2017-09-06 | Disposition: A | Discharge status: Home / Self Care | Payer: Federal, State, Local not specified - PPO | Source: Ambulatory Visit | Attending: Gynecologic Oncology | Admitting: Gynecologic Oncology

## 2017-09-06 ENCOUNTER — Other Ambulatory Visit: Payer: Self-pay

## 2017-09-06 ENCOUNTER — Encounter (HOSPITAL_COMMUNITY): Payer: Self-pay

## 2017-09-06 DIAGNOSIS — Z0183 Encounter for blood typing: Secondary | ICD-10-CM | POA: Insufficient documentation

## 2017-09-06 DIAGNOSIS — I10 Essential (primary) hypertension: Secondary | ICD-10-CM | POA: Insufficient documentation

## 2017-09-06 DIAGNOSIS — C541 Malignant neoplasm of endometrium: Secondary | ICD-10-CM | POA: Insufficient documentation

## 2017-09-06 DIAGNOSIS — Z01812 Encounter for preprocedural laboratory examination: Secondary | ICD-10-CM | POA: Diagnosis not present

## 2017-09-06 HISTORY — DX: Peripheral vascular disease, unspecified: I73.9

## 2017-09-06 LAB — CBC
HEMATOCRIT: 45.1 % (ref 36.0–46.0)
Hemoglobin: 15.6 g/dL — ABNORMAL HIGH (ref 12.0–15.0)
MCH: 32.8 pg (ref 26.0–34.0)
MCHC: 34.6 g/dL (ref 30.0–36.0)
MCV: 94.9 fL (ref 78.0–100.0)
PLATELETS: 209 10*3/uL (ref 150–400)
RBC: 4.75 MIL/uL (ref 3.87–5.11)
RDW: 12.8 % (ref 11.5–15.5)
WBC: 6.7 10*3/uL (ref 4.0–10.5)

## 2017-09-06 LAB — COMPREHENSIVE METABOLIC PANEL
ALBUMIN: 4.4 g/dL (ref 3.5–5.0)
ALT: 18 U/L (ref 14–54)
ANION GAP: 10 (ref 5–15)
AST: 19 U/L (ref 15–41)
Alkaline Phosphatase: 65 U/L (ref 38–126)
BILIRUBIN TOTAL: 0.9 mg/dL (ref 0.3–1.2)
BUN: 13 mg/dL (ref 6–20)
CHLORIDE: 103 mmol/L (ref 101–111)
CO2: 28 mmol/L (ref 22–32)
Calcium: 9.8 mg/dL (ref 8.9–10.3)
Creatinine, Ser: 0.84 mg/dL (ref 0.44–1.00)
GFR calc Af Amer: >60 mL/min (ref >60)
GLUCOSE: 92 mg/dL (ref 65–99)
POTASSIUM: 4 mmol/L (ref 3.5–5.1)
Sodium: 141 mmol/L (ref 135–145)
TOTAL PROTEIN: 7.5 g/dL (ref 6.5–8.1)

## 2017-09-06 LAB — URINALYSIS, ROUTINE W REFLEX MICROSCOPIC
Bacteria, UA: NONE SEEN
Bilirubin Urine: NEGATIVE
Glucose, UA: NEGATIVE mg/dL
Hgb urine dipstick: NEGATIVE
Ketones, ur: NEGATIVE mg/dL
Nitrite: NEGATIVE
Protein, ur: NEGATIVE mg/dL
SPECIFIC GRAVITY, URINE: 1.021 (ref 1.005–1.030)
pH: 6 (ref 5.0–8.0)

## 2017-09-06 LAB — ABO/RH: ABO/RH(D): B POS

## 2017-09-06 LAB — PREGNANCY, URINE: PREG TEST UR: NEGATIVE

## 2017-09-06 NOTE — Progress Notes (Signed)
Chart given to [place], Therapist, sports. Need final CBC

## 2017-09-12 ENCOUNTER — Encounter (HOSPITAL_COMMUNITY): Payer: Self-pay | Admitting: *Deleted

## 2017-09-12 ENCOUNTER — Ambulatory Visit (HOSPITAL_COMMUNITY): Payer: Federal, State, Local not specified - PPO | Admitting: Certified Registered Nurse Anesthetist

## 2017-09-12 ENCOUNTER — Encounter (HOSPITAL_COMMUNITY)
Admission: RE | Disposition: A | Discharge status: Home / Self Care | Payer: Self-pay | Source: Ambulatory Visit | Attending: Gynecologic Oncology

## 2017-09-12 ENCOUNTER — Ambulatory Visit (HOSPITAL_COMMUNITY)
Admission: RE | Admit: 2017-09-12 | Discharge: 2017-09-12 | Disposition: A | Discharge status: Home / Self Care | Payer: Federal, State, Local not specified - PPO | Source: Ambulatory Visit | Attending: Gynecologic Oncology | Admitting: Gynecologic Oncology

## 2017-09-12 DIAGNOSIS — Z808 Family history of malignant neoplasm of other organs or systems: Secondary | ICD-10-CM | POA: Diagnosis not present

## 2017-09-12 DIAGNOSIS — N800 Endometriosis of uterus: Secondary | ICD-10-CM | POA: Insufficient documentation

## 2017-09-12 DIAGNOSIS — Z85820 Personal history of malignant melanoma of skin: Secondary | ICD-10-CM | POA: Insufficient documentation

## 2017-09-12 DIAGNOSIS — Z803 Family history of malignant neoplasm of breast: Secondary | ICD-10-CM | POA: Insufficient documentation

## 2017-09-12 DIAGNOSIS — Z79899 Other long term (current) drug therapy: Secondary | ICD-10-CM | POA: Diagnosis not present

## 2017-09-12 DIAGNOSIS — N83202 Unspecified ovarian cyst, left side: Secondary | ICD-10-CM | POA: Diagnosis not present

## 2017-09-12 DIAGNOSIS — Z881 Allergy status to other antibiotic agents status: Secondary | ICD-10-CM | POA: Diagnosis not present

## 2017-09-12 DIAGNOSIS — E663 Overweight: Secondary | ICD-10-CM | POA: Diagnosis not present

## 2017-09-12 DIAGNOSIS — C541 Malignant neoplasm of endometrium: Secondary | ICD-10-CM | POA: Insufficient documentation

## 2017-09-12 DIAGNOSIS — N736 Female pelvic peritoneal adhesions (postinfective): Secondary | ICD-10-CM | POA: Diagnosis not present

## 2017-09-12 DIAGNOSIS — N83201 Unspecified ovarian cyst, right side: Secondary | ICD-10-CM | POA: Insufficient documentation

## 2017-09-12 DIAGNOSIS — I10 Essential (primary) hypertension: Secondary | ICD-10-CM | POA: Insufficient documentation

## 2017-09-12 HISTORY — PX: ROBOTIC ASSISTED TOTAL HYSTERECTOMY WITH BILATERAL SALPINGO OOPHERECTOMY: SHX6086

## 2017-09-12 HISTORY — PX: LYMPH NODE BIOPSY: SHX201

## 2017-09-12 LAB — TYPE AND SCREEN
ABO/RH(D): B POS
ANTIBODY SCREEN: NEGATIVE

## 2017-09-12 SURGERY — HYSTERECTOMY, TOTAL, ROBOT-ASSISTED, LAPAROSCOPIC, WITH BILATERAL SALPINGO-OOPHORECTOMY
Anesthesia: General

## 2017-09-12 MED ORDER — SENNOSIDES-DOCUSATE SODIUM 8.6-50 MG PO TABS: 1.0000 | ORAL_TABLET | Freq: Two times a day (BID) | ORAL | 0 refills | Status: DC

## 2017-09-12 MED ORDER — STERILE WATER FOR IRRIGATION IR SOLN
Status: DC | PRN
  Administered 2017-09-12: 1000 mL

## 2017-09-12 MED ORDER — OXYCODONE-ACETAMINOPHEN 5-325 MG PO TABS
1.0000 | ORAL_TABLET | ORAL | 0 refills | Status: DC | PRN
Start: 2017-09-12 — End: 2017-09-30

## 2017-09-12 MED ORDER — HYDROMORPHONE HCL 1 MG/ML IJ SOLN
0.2500 mg | INTRAMUSCULAR | Status: DC | PRN
  Administered 2017-09-12 (×2): 0.25 mg via INTRAVENOUS

## 2017-09-12 MED ORDER — LABETALOL HCL 5 MG/ML IV SOLN
INTRAVENOUS | Status: AC
  Filled 2017-09-12: qty 4

## 2017-09-12 MED ORDER — STERILE WATER FOR INJECTION IJ SOLN
INTRAMUSCULAR | Status: DC | PRN
  Administered 2017-09-12: 10 mL

## 2017-09-12 MED ORDER — ROCURONIUM BROMIDE 10 MG/ML (PF) SYRINGE
PREFILLED_SYRINGE | INTRAVENOUS | Status: AC
  Filled 2017-09-12: qty 5

## 2017-09-12 MED ORDER — LIDOCAINE 2% (20 MG/ML) 5 ML SYRINGE
INTRAMUSCULAR | Status: DC | PRN
  Administered 2017-09-12: 100 mg via INTRAVENOUS

## 2017-09-12 MED ORDER — KETAMINE HCL 10 MG/ML IJ SOLN
INTRAMUSCULAR | Status: AC
  Filled 2017-09-12: qty 1

## 2017-09-12 MED ORDER — LIDOCAINE 2% (20 MG/ML) 5 ML SYRINGE
INTRAMUSCULAR | Status: AC
  Filled 2017-09-12: qty 5

## 2017-09-12 MED ORDER — ONDANSETRON HCL 4 MG/2ML IJ SOLN
INTRAMUSCULAR | Status: AC
  Filled 2017-09-12: qty 2

## 2017-09-12 MED ORDER — OXYCODONE HCL 5 MG PO TABS
ORAL_TABLET | ORAL | Status: AC
  Administered 2017-09-12: 5 mg via ORAL
  Filled 2017-09-12: qty 1

## 2017-09-12 MED ORDER — BUPIVACAINE HCL (PF) 0.25 % IJ SOLN
INTRAMUSCULAR | Status: AC
Start: 2017-09-12 — End: ?
  Filled 2017-09-12: qty 30

## 2017-09-12 MED ORDER — ACETAMINOPHEN 500 MG PO TABS
1000.0000 mg | ORAL_TABLET | ORAL | Status: AC
  Administered 2017-09-12: 1000 mg via ORAL
  Filled 2017-09-12: qty 2

## 2017-09-12 MED ORDER — MIDAZOLAM HCL 2 MG/2ML IJ SOLN
INTRAMUSCULAR | Status: AC
  Filled 2017-09-12: qty 2

## 2017-09-12 MED ORDER — PHENYLEPHRINE 40 MCG/ML (10ML) SYRINGE FOR IV PUSH (FOR BLOOD PRESSURE SUPPORT)
PREFILLED_SYRINGE | INTRAVENOUS | Status: DC | PRN
  Administered 2017-09-12 (×2): 80 ug via INTRAVENOUS

## 2017-09-12 MED ORDER — FENTANYL CITRATE (PF) 250 MCG/5ML IJ SOLN
INTRAMUSCULAR | Status: DC | PRN
  Administered 2017-09-12: 125 ug via INTRAVENOUS
  Administered 2017-09-12: 75 ug via INTRAVENOUS
  Administered 2017-09-12: 50 ug via INTRAVENOUS

## 2017-09-12 MED ORDER — LIDOCAINE 2% (20 MG/ML) 5 ML SYRINGE
INTRAMUSCULAR | Status: AC
Start: 2017-09-12 — End: ?
  Filled 2017-09-12: qty 5

## 2017-09-12 MED ORDER — BUPIVACAINE HCL (PF) 0.25 % IJ SOLN
INTRAMUSCULAR | Status: DC | PRN
  Administered 2017-09-12: 20 mL

## 2017-09-12 MED ORDER — PHENYLEPHRINE 40 MCG/ML (10ML) SYRINGE FOR IV PUSH (FOR BLOOD PRESSURE SUPPORT)
PREFILLED_SYRINGE | INTRAVENOUS | Status: AC
Start: 2017-09-12 — End: ?
  Filled 2017-09-12: qty 10

## 2017-09-12 MED ORDER — DEXAMETHASONE SODIUM PHOSPHATE 10 MG/ML IJ SOLN
INTRAMUSCULAR | Status: AC
  Filled 2017-09-12: qty 1

## 2017-09-12 MED ORDER — GABAPENTIN 300 MG PO CAPS
300.0000 mg | ORAL_CAPSULE | ORAL | Status: AC
  Administered 2017-09-12: 300 mg via ORAL
  Filled 2017-09-12: qty 1

## 2017-09-12 MED ORDER — OXYCODONE HCL 5 MG/5ML PO SOLN
5.0000 mg | Freq: Once | ORAL | Status: AC | PRN
  Filled 2017-09-12: qty 5

## 2017-09-12 MED ORDER — LACTATED RINGERS IR SOLN
Status: DC | PRN
  Administered 2017-09-12: 1000 mL

## 2017-09-12 MED ORDER — SCOPOLAMINE 1 MG/3DAYS TD PT72
1.0000 | MEDICATED_PATCH | TRANSDERMAL | Status: DC
  Administered 2017-09-12: 1.5 mg via TRANSDERMAL
  Filled 2017-09-12: qty 1

## 2017-09-12 MED ORDER — PROPOFOL 10 MG/ML IV BOLUS
INTRAVENOUS | Status: DC | PRN
  Administered 2017-09-12: 200 mg via INTRAVENOUS

## 2017-09-12 MED ORDER — HYDROMORPHONE HCL 1 MG/ML IJ SOLN
INTRAMUSCULAR | Status: AC
  Administered 2017-09-12: 0.25 mg via INTRAVENOUS
  Filled 2017-09-12: qty 1

## 2017-09-12 MED ORDER — MEPERIDINE HCL 50 MG/ML IJ SOLN: 6.2500 mg | INTRAMUSCULAR | Status: DC | PRN

## 2017-09-12 MED ORDER — LACTATED RINGERS IV SOLN
INTRAVENOUS | Status: DC
  Administered 2017-09-12: 07:00:00 via INTRAVENOUS

## 2017-09-12 MED ORDER — KETAMINE HCL 10 MG/ML IJ SOLN
INTRAMUSCULAR | Status: DC | PRN
  Administered 2017-09-12: 30 mg via INTRAVENOUS

## 2017-09-12 MED ORDER — ARTIFICIAL TEARS OPHTHALMIC OINT
TOPICAL_OINTMENT | OPHTHALMIC | Status: DC | PRN
  Administered 2017-09-12: 1 via OPHTHALMIC

## 2017-09-12 MED ORDER — LABETALOL HCL 5 MG/ML IV SOLN
INTRAVENOUS | Status: DC | PRN
  Administered 2017-09-12: 5 mg via INTRAVENOUS

## 2017-09-12 MED ORDER — DEXAMETHASONE SODIUM PHOSPHATE 4 MG/ML IJ SOLN: 4.0000 mg | INTRAMUSCULAR | Status: DC

## 2017-09-12 MED ORDER — DIPHENHYDRAMINE HCL 50 MG/ML IJ SOLN
INTRAMUSCULAR | Status: DC | PRN
  Administered 2017-09-12: 25 mg via INTRAVENOUS

## 2017-09-12 MED ORDER — OXYCODONE HCL 5 MG PO TABS
5.0000 mg | ORAL_TABLET | Freq: Once | ORAL | Status: AC | PRN
  Administered 2017-09-12: 5 mg via ORAL

## 2017-09-12 MED ORDER — ARTIFICIAL TEARS OPHTHALMIC OINT
TOPICAL_OINTMENT | OPHTHALMIC | Status: AC
  Filled 2017-09-12: qty 3.5

## 2017-09-12 MED ORDER — ROCURONIUM BROMIDE 10 MG/ML (PF) SYRINGE
PREFILLED_SYRINGE | INTRAVENOUS | Status: DC | PRN
  Administered 2017-09-12: 5 mg via INTRAVENOUS
  Administered 2017-09-12: 60 mg via INTRAVENOUS

## 2017-09-12 MED ORDER — HYDROMORPHONE HCL 1 MG/ML IJ SOLN
INTRAMUSCULAR | Status: DC | PRN
  Administered 2017-09-12 (×2): 0.5 mg via INTRAVENOUS

## 2017-09-12 MED ORDER — CEFAZOLIN SODIUM-DEXTROSE 2-4 GM/100ML-% IV SOLN
2.0000 g | INTRAVENOUS | Status: AC
  Administered 2017-09-12: 2 g via INTRAVENOUS
  Filled 2017-09-12: qty 100

## 2017-09-12 MED ORDER — ONDANSETRON HCL 4 MG/2ML IJ SOLN
INTRAMUSCULAR | Status: DC | PRN
  Administered 2017-09-12: 4 mg via INTRAVENOUS

## 2017-09-12 MED ORDER — SUGAMMADEX SODIUM 200 MG/2ML IV SOLN
INTRAVENOUS | Status: DC | PRN
  Administered 2017-09-12: 200 mg via INTRAVENOUS

## 2017-09-12 MED ORDER — PROMETHAZINE HCL 25 MG/ML IJ SOLN: 6.2500 mg | INTRAMUSCULAR | Status: DC | PRN

## 2017-09-12 MED ORDER — DIPHENHYDRAMINE HCL 50 MG/ML IJ SOLN
INTRAMUSCULAR | Status: AC
  Filled 2017-09-12: qty 1

## 2017-09-12 MED ORDER — FENTANYL CITRATE (PF) 250 MCG/5ML IJ SOLN
INTRAMUSCULAR | Status: AC
  Filled 2017-09-12: qty 5

## 2017-09-12 MED ORDER — MIDAZOLAM HCL 2 MG/2ML IJ SOLN
INTRAMUSCULAR | Status: DC | PRN
  Administered 2017-09-12: 2 mg via INTRAVENOUS

## 2017-09-12 MED ORDER — HYDROMORPHONE HCL 2 MG/ML IJ SOLN
INTRAMUSCULAR | Status: AC
Start: 2017-09-12 — End: ?
  Filled 2017-09-12: qty 1

## 2017-09-12 MED ORDER — DEXAMETHASONE SODIUM PHOSPHATE 10 MG/ML IJ SOLN
INTRAMUSCULAR | Status: DC | PRN
  Administered 2017-09-12: 10 mg via INTRAVENOUS

## 2017-09-12 MED ORDER — ENOXAPARIN SODIUM 40 MG/0.4ML ~~LOC~~ SOLN
40.0000 mg | SUBCUTANEOUS | Status: AC
  Administered 2017-09-12: 40 mg via SUBCUTANEOUS
  Filled 2017-09-12: qty 0.4

## 2017-09-12 MED ORDER — SUGAMMADEX SODIUM 200 MG/2ML IV SOLN
INTRAVENOUS | Status: AC
  Filled 2017-09-12: qty 2

## 2017-09-12 MED ORDER — STERILE WATER FOR INJECTION IJ SOLN
INTRAMUSCULAR | Status: AC
  Filled 2017-09-12: qty 10

## 2017-09-12 MED ORDER — LIDOCAINE 2% (20 MG/ML) 5 ML SYRINGE
INTRAMUSCULAR | Status: DC | PRN
  Administered 2017-09-12: 1.5 mg/kg/h via INTRAVENOUS
  Administered 2017-09-12: 08:00:00 via INTRAVENOUS

## 2017-09-12 MED ORDER — PROPOFOL 10 MG/ML IV BOLUS
INTRAVENOUS | Status: AC
  Filled 2017-09-12: qty 40

## 2017-09-12 SURGICAL SUPPLY — 49 items
ADH SKN CLS APL DERMABOND .7 (GAUZE/BANDAGES/DRESSINGS) ×2
AGENT HMST KT MTR STRL THRMB (HEMOSTASIS)
APL ESCP 34 STRL LF DISP (HEMOSTASIS)
APPLICATOR SURGIFLO ENDO (HEMOSTASIS) IMPLANT
BAG LAPAROSCOPIC 12 15 PORT 16 (BASKET) IMPLANT
BAG RETRIEVAL 12/15 (BASKET)
BAG SPEC RTRVL LRG 6X4 10 (ENDOMECHANICALS)
COVER BACK TABLE 60X90IN (DRAPES) ×3 IMPLANT
COVER TIP SHEARS 8 DVNC (MISCELLANEOUS) ×2 IMPLANT
COVER TIP SHEARS 8MM DA VINCI (MISCELLANEOUS) ×1
DERMABOND ADVANCED (GAUZE/BANDAGES/DRESSINGS) ×1
DERMABOND ADVANCED .7 DNX12 (GAUZE/BANDAGES/DRESSINGS) ×2 IMPLANT
DRAPE ARM DVNC X/XI (DISPOSABLE) ×8 IMPLANT
DRAPE COLUMN DVNC XI (DISPOSABLE) ×2 IMPLANT
DRAPE DA VINCI XI ARM (DISPOSABLE) ×4
DRAPE DA VINCI XI COLUMN (DISPOSABLE) ×1
DRAPE SHEET LG 3/4 BI-LAMINATE (DRAPES) ×3 IMPLANT
DRAPE SURG IRRIG POUCH 19X23 (DRAPES) ×3 IMPLANT
ELECT REM PT RETURN 15FT ADLT (MISCELLANEOUS) ×3 IMPLANT
GLOVE BIO SURGEON STRL SZ 6 (GLOVE) ×12 IMPLANT
GLOVE BIO SURGEON STRL SZ 6.5 (GLOVE) IMPLANT
GOWN STRL REUS W/ TWL LRG LVL3 (GOWN DISPOSABLE) ×4 IMPLANT
GOWN STRL REUS W/TWL LRG LVL3 (GOWN DISPOSABLE) ×6
HOLDER FOLEY CATH W/STRAP (MISCELLANEOUS) ×3 IMPLANT
IRRIG SUCT STRYKERFLOW 2 WTIP (MISCELLANEOUS) ×3
IRRIGATION SUCT STRKRFLW 2 WTP (MISCELLANEOUS) ×2 IMPLANT
KIT PROCEDURE DA VINCI SI (MISCELLANEOUS)
KIT PROCEDURE DVNC SI (MISCELLANEOUS) IMPLANT
MANIPULATOR UTERINE 4.5 ZUMI (MISCELLANEOUS) ×3 IMPLANT
NDL SPNL 18GX3.5 QUINCKE PK (NEEDLE) IMPLANT
NEEDLE SPNL 18GX3.5 QUINCKE PK (NEEDLE) IMPLANT
OBTURATOR OPTICAL STANDARD 8MM (TROCAR) ×1
OBTURATOR OPTICAL STND 8 DVNC (TROCAR) ×2
OBTURATOR OPTICALSTD 8 DVNC (TROCAR) ×2 IMPLANT
PACK ROBOT GYN CUSTOM WL (TRAY / TRAY PROCEDURE) ×3 IMPLANT
PAD POSITIONING PINK XL (MISCELLANEOUS) ×3 IMPLANT
POUCH SPECIMEN RETRIEVAL 10MM (ENDOMECHANICALS) IMPLANT
SEAL CANN UNIV 5-8 DVNC XI (MISCELLANEOUS) ×8 IMPLANT
SEAL XI 5MM-8MM UNIVERSAL (MISCELLANEOUS) ×4
SET TRI-LUMEN FLTR TB AIRSEAL (TUBING) ×3 IMPLANT
SURGIFLO W/THROMBIN 8M KIT (HEMOSTASIS) IMPLANT
SUT VIC AB 0 CT1 27 (SUTURE)
SUT VIC AB 0 CT1 27XBRD ANTBC (SUTURE) IMPLANT
SYR 10ML LL (SYRINGE) IMPLANT
TOWEL OR NON WOVEN STRL DISP B (DISPOSABLE) ×3 IMPLANT
TRAP SPECIMEN MUCOUS 40CC (MISCELLANEOUS) IMPLANT
TRAY FOLEY MTR SLVR 16FR STAT (SET/KITS/TRAYS/PACK) ×3 IMPLANT
UNDERPAD 30X30 (UNDERPADS AND DIAPERS) ×3 IMPLANT
WATER STERILE IRR 1000ML POUR (IV SOLUTION) ×3 IMPLANT

## 2017-09-12 NOTE — Interval H&P Note (Signed)
History and Physical Interval Note:  09/12/2017 7:18 AM  Tina Ray  has presented today for surgery, with the diagnosis of ENDOMETRIAL CANCER  The various methods of treatment have been discussed with the patient and family. After consideration of risks, benefits and other options for treatment, the patient has consented to  Procedure(s): XI ROBOTIC [name] (Bilateral) SENTINEL LYMPH NODE BIOPSY (N/A) as a surgical intervention .  The patient's history has been reviewed, patient examined, no change in status, stable for surgery.  I have reviewed the patient's chart and labs. Her CT scan did not show obvious extrauterine disease, though myometrial and LUS involvement of tumor was suspected. Questions were answered to the patient's satisfaction.     Thereasa Solo

## 2017-09-12 NOTE — Transfer of Care (Signed)
Immediate Anesthesia Transfer of Care Note  Patient: Tina Ray  Procedure(s) Performed: XI ROBOTIC ASSISTED TOTAL LAPAROSCOPIC  HYSTERECTOMY WITH BILATERAL SALPINGO OOPHORECTOMY (Bilateral ) SENTINEL LYMPH NODE BIOPSY (N/A )  Patient Location: PACU  Anesthesia Type:General  Level of Consciousness: awake, alert  and oriented  Airway & Oxygen Therapy: Patient Spontanous Breathing and Patient connected to face mask oxygen  Post-op Assessment: Report given to RN  Post vital signs: Reviewed and stable  Last Vitals: 135/88 Vitals Value Taken Time  BP    Temp    Pulse 83 09/12/2017  9:17 AM  Resp 13 09/12/2017  9:17 AM  SpO2 100 % 09/12/2017  9:17 AM  Vitals shown include unvalidated device data.  Last Pain:  Vitals:   09/12/17 0518  TempSrc:   PainSc: 0-No pain         Complications: No apparent anesthesia complications

## 2017-09-12 NOTE — Discharge Instructions (Signed)
09/12/2017  Return to work: 4 weeks  Activity: 1. Be up and out of the bed during the day.  Take a nap if needed.  You may walk up steps but be careful and use the hand rail.  Stair climbing will tire you more than you think, you may need to stop part way and rest.   2. No lifting or straining for 6 weeks.  3. No driving for 1 weeks.  Do Not drive if you are taking narcotic pain medicine.  4. Shower daily.  Use soap and water on your incision and pat dry; don't rub.   5. No sexual activity and nothing in the vagina for 8 weeks.  Medications:  - Take ibuprofen and tylenol first line for pain control. Take these regularly (every 6 hours) to decrease the build up of pain.  - If necessary, for severe pain not relieved by ibuprofen, take percocet.  - While taking percocet you should take sennakot every night to reduce the likelihood of constipation. If this causes diarrhea, stop its use.  Diet: 1. Low sodium Heart Healthy Diet is recommended.  2. It is safe to use a laxative if you have difficulty moving your bowels.   Wound Care: 1. Keep clean and dry.  Shower daily.  Reasons to call the Doctor:   Fever - Oral temperature greater than 100.4 degrees Fahrenheit  Foul-smelling vaginal discharge  Difficulty urinating  Nausea and vomiting  Increased pain at the site of the incision that is unrelieved with pain medicine.  Difficulty breathing with or without chest pain  New calf pain especially if only on one side  Sudden, continuing increased vaginal bleeding with or without clots.   Follow-up: 1. See Everitt Amber in 3 weeks.  Contacts: For questions or concerns you should contact:  Dr. Everitt Amber at [phone] After hours and on week-ends call [phone] and ask to speak to the physician on call for Gynecologic Oncology

## 2017-09-12 NOTE — Op Note (Signed)
OPERATIVE NOTE 09/12/17  Surgeon: Donaciano Eva   Assistants: Dr Lahoma Crocker (an MD assistant was necessary for tissue manipulation, management of robotic instrumentation, retraction and positioning due to the complexity of the case and hospital policies).   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 Sigmoid adhesions to left fallopian tube and ovary. No suspicious lymph nodes.   Estimated Blood Loss:  <25cc      Total IV Fluids: 700 ml         Specimens: uterus, cervix, bilateral tubes and ovaries, right obturator SLN 1 and 2, left obturator SLN 1         Complications:  None; patient tolerated the procedure well.         Disposition: PACU - hemodynamically stable.  Procedure Details  The patient was seen in the Holding Room. The risks, benefits, complications, treatment options, and expected outcomes were discussed with the patient.  The patient concurred with the proposed plan, giving informed consent.  The site of surgery properly noted/marked. The patient was identified as Tina Ray and the procedure verified as a Robotic-assisted hysterectomy with bilateral salpingo oophorectomy with SLN biopsy. A Time Out was held and the above information confirmed.  After induction of anesthesia, the patient was draped and prepped in the usual sterile manner. Pt was placed in supine position after anesthesia and draped and prepped in the usual sterile manner. The abdominal drape was placed after the CholoraPrep had been allowed to dry for 3 minutes.  Her arms were  tucked to her side with all appropriate precautions.  The shoulders were stabilized with padded shoulder blocks applied to the acromium processes.  The patient was placed in the semi-lithotomy position in [place].  The perineum was prepped with Betadine. The patient was then prepped. Foley catheter was placed.  A sterile speculum was placed in the vagina.  The cervix was grasped with a single-tooth tenaculum. 2mg  total of ICG was injected into the cervical stroma at 2 and 9 o'clock with 1cc injected at a 1cm and 16mm depth (concentration 0.5mg /ml) in all locations. The cervix was dilated with Kennon Rounds dilators.  The ZUMI uterine manipulator with a medium colpotomizer ring was placed without difficulty.  A pneum occluder balloon was placed over the manipulator.  OG tube placement was confirmed and to suction.   Next, a 5 mm skin incision was made 1 cm below the subcostal margin in the midclavicular line.  The 5 mm Optiview port and scope was used for direct entry.  Opening pressure was under 10 mm CO2.  The abdomen was insufflated and the findings were noted as above.   At this point and all points during the procedure, the patient's intra-abdominal pressure did not exceed 15 mmHg. Next, a 10 mm skin incision was made in the umbilicus and a right and left port was placed about 10 cm lateral to the robot port on the right and left side.  A fourth arm was placed in the left lower quadrant 2 cm above and superior and medial to the anterior superior iliac spine.  All ports were placed under direct visualization.  The patient was placed  in steep Trendelenburg.  Bowel was folded away into the upper abdomen.  The robot was docked in the normal manner.  The right and left peritoneum were opened parallel to the IP ligament to open the retroperitoneal spaces bilaterally. The SLN mapping was performed in bilateral pelvic basins. The para rectal and paravesical spaces were opened up entirely with careful dissection below  the level of the ureters bilaterally and to the depth of the uterine artery origin in order to skeletonize the uterine "web" and ensure visualization of all parametrial channels. The para-aortic basins were carefully exposed and evaluated for isolated para-aortic SLN's. Lymphatic channels were identified travelling to the following visualized sentinel lymph node's: right and left obturator SLN's. These SLN's were separated from their surrounding lymphatic tissue, removed and sent for permanent pathology.  The hysterectomy was started after the round ligament on the right side was incised and the retroperitoneum was entered and the pararectal space was developed.  The ureter was noted to be on the medial leaf of the broad ligament.  The peritoneum above the ureter was incised and stretched and the infundibulopelvic ligament was skeletonized, cauterized and cut.  The posterior peritoneum was taken down to the level of the KOH ring.  The anterior peritoneum was also taken down.  The bladder flap was created to the level of the KOH ring.  The uterine artery on the right side was skeletonized, cauterized and cut in the normal manner.  A similar procedure was performed on the left.  The colpotomy was made and the uterus, cervix, bilateral ovaries and tubes were amputated and delivered through the vagina.  Pedicles were inspected and excellent hemostasis was achieved.    The colpotomy at the vaginal cuff was closed with Vicryl on a CT1 needle in a running manner.  Irrigation was used and excellent hemostasis was achieved.  At this point in the procedure was completed.  Robotic instruments were removed under direct visulaization.  The robot was undocked. The 10 mm ports were closed with Vicryl on a UR-5 needle and the fascia was closed with 0 Vicryl on a UR-5 needle.  The skin was closed with 4-0 Vicryl in a subcuticular manner.  Dermabond was applied.  Sponge, lap and needle counts correct x 2.  The patient was taken  to the recovery room in stable condition.  The vagina was swabbed with  minimal bleeding noted.   All instrument and needle counts were correct x  3.   The patient was transferred to the recovery room in a stable condition.  Donaciano Eva, MD

## 2017-09-12 NOTE — Anesthesia Postprocedure Evaluation (Signed)
Anesthesia Post Note  Patient: Tina Ray  Procedure(s) Performed: XI ROBOTIC ASSISTED TOTAL LAPAROSCOPIC  HYSTERECTOMY WITH BILATERAL SALPINGO OOPHORECTOMY (Bilateral ) SENTINEL LYMPH NODE BIOPSY (N/A )     Patient location during evaluation: PACU Anesthesia Type: General Level of consciousness: awake and alert Pain management: pain level controlled Vital Signs Assessment: post-procedure vital signs reviewed and stable Respiratory status: spontaneous breathing, nonlabored ventilation and respiratory function stable Cardiovascular status: blood pressure returned to baseline and stable Postop Assessment: no apparent nausea or vomiting Anesthetic complications: no    Last Vitals:  Vitals:   09/12/17 1000 09/12/17 1015  BP: (!) 150/99 (!) 154/98  Pulse: 89 76  Resp: 14 17  Temp:    SpO2: 99% 98%    Last Pain:  Vitals:   09/12/17 1000  TempSrc:   PainSc: [place]

## 2017-09-12 NOTE — Anesthesia Procedure Notes (Signed)
Procedure Name: Intubation Date/Time: 09/12/2017 7:39 AM Performed by: Raenette Rover, CRNA Pre-anesthesia Checklist: Patient identified, Emergency Drugs available, Suction available and Patient being monitored Patient Re-evaluated:Patient Re-evaluated prior to induction Oxygen Delivery Method: Circle system utilized Preoxygenation: Pre-oxygenation with 100% oxygen Induction Type: IV induction Ventilation: Mask ventilation without difficulty Laryngoscope Size: Mac and 3 Grade View: Grade I Tube type: Oral Tube size: 7.0 mm Number of attempts: 1 Airway Equipment and Method: Stylet Placement Confirmation: ETT inserted through vocal cords under direct vision,  positive ETCO2,  breath sounds checked- equal and bilateral and CO2 detector Secured at: 21 cm Tube secured with: Tape Dental Injury: Teeth and Oropharynx as per pre-operative assessment

## 2017-09-12 NOTE — Anesthesia Preprocedure Evaluation (Signed)
Anesthesia Evaluation  Patient identified by MRN, date of birth, ID band Patient awake    Reviewed: Allergy & Precautions, NPO status , Patient's Chart, lab work & pertinent test results  Airway Mallampati: II  TM Distance: >3 FB Neck ROM: Full    Dental no notable dental hx.    Pulmonary neg pulmonary ROS,    Pulmonary exam normal breath sounds clear to auscultation       Cardiovascular hypertension, Pt. on medications negative cardio ROS Normal cardiovascular exam Rhythm:Regular Rate:Normal     Neuro/Psych negative neurological ROS  negative psych ROS   GI/Hepatic negative GI ROS, Neg liver ROS,   Endo/Other  negative endocrine ROS  Renal/GU negative Renal ROS  negative genitourinary   Musculoskeletal negative musculoskeletal ROS (+)   Abdominal   Peds negative pediatric ROS (+)  Hematology negative hematology ROS (+)   Anesthesia Other Findings Endometrial cancer  Reproductive/Obstetrics negative OB ROS                             Anesthesia Physical Anesthesia Plan  ASA: III  Anesthesia Plan: General   Post-op Pain Management:    Induction: Intravenous  PONV Risk Score and Plan: 3 and Ondansetron, Dexamethasone and Midazolam  Airway Management Planned: Oral ETT  Additional Equipment:   Intra-op Plan:   Post-operative Plan: Extubation in OR  Informed Consent: I have reviewed the patients History and Physical, chart, labs and discussed the procedure including the risks, benefits and alternatives for the proposed anesthesia with the patient or authorized representative who has indicated his/her understanding and acceptance.   Dental advisory given  Plan Discussed with: CRNA  Anesthesia Plan Comments:         Anesthesia Quick Evaluation

## 2017-09-13 ENCOUNTER — Encounter (HOSPITAL_COMMUNITY): Payer: Self-pay | Admitting: Gynecologic Oncology

## 2017-09-15 ENCOUNTER — Telehealth: Payer: Self-pay | Admitting: Gynecologic Oncology

## 2017-09-15 NOTE — Telephone Encounter (Signed)
Patient informed of final path results.  Informed her that her case/path would be discussed at our next tumor board on June 24 to compare her pre-op biopsy to the final path.  Advised she would be contacted with those recommendations but at this time, Dr. Denman George is leaning towards no treatment needed.  She states she is doing well post-operatively.  She is planning on adding Miralax to her bowel regimen since she is slightly constipated.  Advised to call for any needs or concerns.

## 2017-09-19 DIAGNOSIS — H6240 Otitis externa in other diseases classified elsewhere, unspecified ear: Secondary | ICD-10-CM | POA: Diagnosis not present

## 2017-09-19 DIAGNOSIS — H9313 Tinnitus, bilateral: Secondary | ICD-10-CM | POA: Diagnosis not present

## 2017-09-19 DIAGNOSIS — B369 Superficial mycosis, unspecified: Secondary | ICD-10-CM | POA: Diagnosis not present

## 2017-09-26 ENCOUNTER — Encounter: Payer: Self-pay | Admitting: Gynecologic Oncology

## 2017-09-26 ENCOUNTER — Telehealth: Payer: Self-pay | Admitting: Oncology

## 2017-09-26 DIAGNOSIS — C541 Malignant neoplasm of endometrium: Secondary | ICD-10-CM

## 2017-09-26 DIAGNOSIS — H903 Sensorineural hearing loss, bilateral: Secondary | ICD-10-CM | POA: Diagnosis not present

## 2017-09-26 NOTE — Telephone Encounter (Signed)
Called Tina Ray and advised her of tumor board recommendations from today of close observation and genetic testing. She said she would like to go ahead with genetic testing.  Advised her a referral will be entered and that we will call her with the appointment.

## 2017-09-26 NOTE — Progress Notes (Signed)
Gynecologic Oncology Multi-Disciplinary Disposition Conference Note  Date of the Conference: September 26, 2017  Patient Name: Tina Ray [facility]  Referring Provider: Dr. Melba Coon Primary GYN Oncologist: Dr. Everitt Amber  Stage/Disposition: Stage IA endometrial cancer. Disposition is to close observation.  Genetics referral given age, history.   This Multidisciplinary conference took place involving physicians from [place], [place], Radiation Oncology, Pathology, Radiology along with the Gynecologic Oncology Nurse Practitioner and RN.  Comprehensive assessment of the patient's malignancy, staging, need for surgery, chemotherapy, radiation therapy, and need for further testing were reviewed. Supportive measures, both inpatient and following discharge were also discussed. The recommended plan of care is documented. Greater than 35 minutes were spent correlating and coordinating this patient's care.

## 2017-09-29 DIAGNOSIS — L814 Other melanin hyperpigmentation: Secondary | ICD-10-CM | POA: Diagnosis not present

## 2017-09-29 DIAGNOSIS — L821 Other seborrheic keratosis: Secondary | ICD-10-CM | POA: Diagnosis not present

## 2017-09-29 DIAGNOSIS — D225 Melanocytic nevi of trunk: Secondary | ICD-10-CM | POA: Diagnosis not present

## 2017-09-29 DIAGNOSIS — L700 Acne vulgaris: Secondary | ICD-10-CM | POA: Diagnosis not present

## 2017-09-30 ENCOUNTER — Telehealth: Payer: Self-pay | Admitting: Genetics

## 2017-09-30 ENCOUNTER — Inpatient Hospital Stay: Payer: Federal, State, Local not specified - PPO | Attending: Gynecologic Oncology | Admitting: Gynecologic Oncology

## 2017-09-30 ENCOUNTER — Encounter: Payer: Self-pay | Admitting: Gynecologic Oncology

## 2017-09-30 VITALS — BP 109/90 | HR 82 | Temp 97.8°F | Resp 20 | Ht 62.5 in | Wt 197.3 lb

## 2017-09-30 DIAGNOSIS — C541 Malignant neoplasm of endometrium: Secondary | ICD-10-CM | POA: Diagnosis not present

## 2017-09-30 DIAGNOSIS — Z90722 Acquired absence of ovaries, bilateral: Secondary | ICD-10-CM

## 2017-09-30 DIAGNOSIS — Z7189 Other specified counseling: Secondary | ICD-10-CM | POA: Diagnosis not present

## 2017-09-30 DIAGNOSIS — Z90710 Acquired absence of both cervix and uterus: Secondary | ICD-10-CM | POA: Diagnosis not present

## 2017-09-30 NOTE — Progress Notes (Signed)
Consult Note: Gyn-Onc  Consult was requested by Dr. Nile Dear for the evaluation of Tina Ray 47 y.o. female  CC:  Chief Complaint  Patient presents with  . endometrial cancer    Assessment/Plan:  Tina Ray  is a 48 y.o.  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  Pathology revealed low risk factors for recurrence, therefore no adjuvant therapy is recommended according to NCCN guidelines.  I discussed risk for recurrence and typical symptoms encouraged her to notify us of these should they develop between visits.  I recommend she have follow-up every 6 months for 5 years in accordance with NCCN guidelines. Those visits should include symptom assessment, physical exam and pelvic examination. Pap smears are not indicated or recommended in the routine surveillance of endometrial cancer.  Is seeing genetics due to young age, family history and personal history of multiple malignancies.   She will return to see me in December, and Dr Melba Coon annually in June.   HPI: Tina Ray is a 47 year old P0 who is seen in consultation at the request of Dr Melba Coon for grade 1 endometrial cancer.   The patient has a history of previously normal regular menstrual cycles up until December 2018 when she began experiencing intermenstrual bleeding.  She then discussed this with her OB/GYN during in May 2019 routine visit.  This prompted a transvaginal ultrasound scan which identified a 1 cm endometrial polyp.  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  The patient is nulliparous as her husband had had a prior vasectomy.  Patient has a history of 2 prior melanoma surgeries including one in her 54s.  Her family history is significant for a paternal grandmother with breast cancer at age 63,  father with prostate cancer, and a maternal grandfather and 3 additional people on her mother side of the family with melanoma history.  The patient has had no prior abdominal surgeries.  She has hypertension but no diabetes mellitus.  She is overweight and weighs 204 pounds.  Interval Hx:  On 09/12/17 she underwent robotic assisted total hysterectomy, BSO, SLN biopsy. Final pathology revealed a grade 2 endometrioid (no serous features identified on final pathology), non-invasive lesion measuring 0.8cm. There was no LVSI. SLN's, cervix and adnexa were negative. IHC normal for MMR and PCR showed MSI normal.  She was determined to have low risk disease for recurrence, and in accordance with NCCN guidelines, no adjuvant therapy was recommended. Given her young age at diagnosis and prior melanoma history, she qualifies for genetics evaluation and this is scheduled.   Current Meds:  Outpatient Encounter Medications as of 09/30/2017  Medication Sig  . acyclovir (ZOVIRAX) 400 MG tablet Take 400 mg by mouth twice daily as needed for fever blisters  . cyclobenzaprine (FLEXERIL) 10 MG tablet Take 10 mg by mouth 3 (three) times daily as needed for muscle spasms.  . fluticasone (FLONASE) 50 MCG/ACT nasal spray Use 1 spray in each nostril once daily at night  . ibuprofen (ADVIL,MOTRIN) 200 MG tablet Take 400 mg by mouth 2 (two) times daily as needed for moderate pain.  Marland Kitchen lisinopril-hydrochlorothiazide (PRINZIDE,ZESTORETIC) 20-25 MG tablet Take 1 tablet by mouth daily.  . Menthol, Topical Analgesic, (BIOFREEZE EX) Apply 1 application topically 2 (two) times daily as needed (back pain).  Marland Kitchen oxyCODONE-acetaminophen (PERCOCET/ROXICET) 5-325 MG tablet  Take 1 tablet by mouth every 4 (four) hours as needed for up to 20 doses for severe pain.  Marland Kitchen senna-docusate (SENOKOT-S) 8.6-50 MG tablet Take 1 tablet by mouth 2 (two) times daily.   No facility-administered encounter medications on file as of 09/30/2017.     Allergy:   Allergies  Allergen Reactions  . Erythromycin Base Hives  . Sulfa Antibiotics Hives    Social Hx:   Social History   Socioeconomic History  . Marital status: Married    Spouse name: Not on file  . Number of children: Not on file  . Years of education: Not on file  . Highest education level: Not on file  Occupational History  . Not on file  Social Needs  . Financial resource strain: Not on file  . Food insecurity:    Worry: Not on file    Inability: Not on file  . Transportation needs:    Medical: Not on file    Non-medical: Not on file  Tobacco Use  . Smoking status: Never Smoker  . Smokeless tobacco: Never Used  Substance and Sexual Activity  . Alcohol use: Yes    Alcohol/week: 1.8 oz    Types: 3 Glasses of wine per week    Comment: 2 or 3 classes of wine daily  . Drug use: Never  . Sexual activity: Not on file  Lifestyle  . Physical activity:    Days per week: Not on file    Minutes per session: Not on file  . Stress: Not on file  Relationships  . Social connections:    Talks on phone: Not on file    Gets together: Not on file    Attends religious service: Not on file    Active member of club or organization: Not on file    Attends meetings of clubs or organizations: Not on file    Relationship status: Not on file  . Intimate partner violence:    Fear of current or ex partner: Not on file    Emotionally abused: Not on file    Physically abused: Not on file    Forced sexual activity: Not on file  Other Topics Concern  . Not on file  Social History Narrative  . Not on file    Past Surgical Hx:  Past Surgical History:  Procedure Laterality Date  . BREAST BIOPSY Left 03/04/2016  . BREAST BIOPSY Left 03/06/2015  . DENTAL SURGERY     wisedom tooth extraction  . LYMPH NODE BIOPSY N/A 09/12/2017   Procedure: SENTINEL LYMPH NODE BIOPSY;  Surgeon: Everitt Amber, MD;  Location: [facility];  Service: Gynecology;  Laterality: N/A;  . ROBOTIC ASSISTED TOTAL  HYSTERECTOMY WITH BILATERAL SALPINGO OOPHERECTOMY Bilateral 09/12/2017   Procedure: XI ROBOTIC ASSISTED TOTAL LAPAROSCOPIC  HYSTERECTOMY WITH BILATERAL SALPINGO OOPHORECTOMY;  Surgeon: Everitt Amber, MD;  Location: [facility];  Service: Gynecology;  Laterality: Bilateral;    Past Medical Hx:  Past Medical History:  Diagnosis Date  . Hypertension   . IBS (irritable bowel syndrome)   . Peripheral vascular disease ([place])   . Skin cancer   . Skin cancer (melanoma) ([place])    1999    Past Gynecological History:   No LMP recorded. (Menstrual status: Irregular Periods).  Family Hx:  Family History  Problem Relation Age of Onset  . Breast cancer Paternal Grandmother   . Hypercholesterolemia Mother   . Diabetes Father   . Multiple sclerosis Father   . Bone cancer Father   .  Colon cancer Father   . Prostate cancer Father   . Diabetes Maternal Aunt   . Diabetes Maternal Uncle   . Hypertension Maternal Grandmother   . Diabetes Maternal Grandmother   . Diabetes Maternal Grandfather     Review of Systems:  Constitutional  Feels well,    ENT Normal appearing ears and nares bilaterally Skin/Breast  No rash, sores, jaundice, itching, dryness Cardiovascular  No chest pain, shortness of breath, or edema  Pulmonary  No cough or wheeze.  Gastro Intestinal  No nausea, vomitting, or diarrhoea. No bright red blood per rectum, no abdominal pain, change in bowel movement, or constipation.  Genito Urinary  No frequency, urgency, dysuria, + abnormal uterine bleeding Musculo Skeletal  No myalgia, arthralgia, joint swelling or pain  Neurologic  No weakness, numbness, change in gait,  Psychology  No depression, anxiety, insomnia.   Vitals:  There were no vitals taken for this visit.  Physical Exam: WD in NAD Neck  Supple NROM, without any enlargements.  Lymph Node Survey No cervical supraclavicular or inguinal adenopathy Cardiovascular  Pulse normal rate, regularity and rhythm. S1 and S2  normal.  Lungs  Clear to auscultation bilateraly, without wheezes/crackles/rhonchi. Good air movement.  Skin  No rash/lesions/breakdown  Psychiatry  Alert and oriented to person, place, and time  Abdomen  Normoactive bowel sounds, abdomen soft, non-tender and overweight without evidence of hernia.  Back No CVA tenderness Genito Urinary  Vulva/vagina: Normal external female genitalia.   No lesions. No discharge or bleeding.  Bladder/urethra:  No lesions or masses, well supported bladder  Vagina: normal  Cervix: Normal appearing, no lesions.  Uterus:  Small, mobile, no parametrial involvement or nodularity.  Adnexa: no palpable masses. Rectal  deferred Extremities  No bilateral cyanosis, clubbing or edema.   30 minutes of direct face to face counseling time was spent with the patient. This included discussion about prognosis, therapy recommendations and postoperative side effects and are beyond the scope of routine postoperative care.  Thereasa Solo, MD  09/30/2017, 11:54 AM

## 2017-09-30 NOTE — Telephone Encounter (Signed)
Scheduled appt per 6/26 sch message for genetics - scheduled appt and left message for pt with appt date and time.

## 2017-09-30 NOTE — Patient Instructions (Signed)
Please return to see Dr Denman George in December, 2019 and you will follow-up with Dr Melba Coon in June, 2020. You will see Dr Denman George or Dr Melba Coon every 6 months for 5 years.  Please notify Dr Denman George at phone number [phone] if you notice vaginal bleeding, new pelvic or abdominal pains, bloating, feeling full easy, or a change in bladder or bowel function.   A consultation with genetics has been scheduled.

## 2017-10-10 DIAGNOSIS — F4322 Adjustment disorder with anxiety: Secondary | ICD-10-CM | POA: Diagnosis not present

## 2017-10-14 DIAGNOSIS — F4322 Adjustment disorder with anxiety: Secondary | ICD-10-CM | POA: Diagnosis not present

## 2017-10-21 DIAGNOSIS — F4322 Adjustment disorder with anxiety: Secondary | ICD-10-CM | POA: Diagnosis not present

## 2017-10-24 DIAGNOSIS — K080 Exfoliation of teeth due to systemic causes: Secondary | ICD-10-CM | POA: Diagnosis not present

## 2017-10-31 DIAGNOSIS — F4322 Adjustment disorder with anxiety: Secondary | ICD-10-CM | POA: Diagnosis not present

## 2017-11-07 DIAGNOSIS — F4322 Adjustment disorder with anxiety: Secondary | ICD-10-CM | POA: Diagnosis not present

## 2017-11-10 ENCOUNTER — Encounter: Payer: Self-pay | Admitting: Genetics

## 2017-11-10 ENCOUNTER — Inpatient Hospital Stay: Payer: Federal, State, Local not specified - PPO

## 2017-11-10 ENCOUNTER — Inpatient Hospital Stay: Payer: Federal, State, Local not specified - PPO | Attending: Gynecologic Oncology | Admitting: Genetics

## 2017-11-10 DIAGNOSIS — C541 Malignant neoplasm of endometrium: Secondary | ICD-10-CM

## 2017-11-10 DIAGNOSIS — Z808 Family history of malignant neoplasm of other organs or systems: Secondary | ICD-10-CM

## 2017-11-10 DIAGNOSIS — Z8042 Family history of malignant neoplasm of prostate: Secondary | ICD-10-CM

## 2017-11-10 DIAGNOSIS — Z803 Family history of malignant neoplasm of breast: Secondary | ICD-10-CM | POA: Diagnosis not present

## 2017-11-10 DIAGNOSIS — Z801 Family history of malignant neoplasm of trachea, bronchus and lung: Secondary | ICD-10-CM | POA: Insufficient documentation

## 2017-11-10 DIAGNOSIS — Z800 Family history of malignant neoplasm of digestive organs: Secondary | ICD-10-CM

## 2017-11-10 NOTE — Progress Notes (Signed)
REFERRING PROVIDER: Everitt Amber, MD [address], [place] [address]  PRIMARY PROVIDER:  Orpah Melter, MD  PRIMARY REASON FOR VISIT:  1. 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 Family history of prostate cancer   3. Family history of breast cancer   4. Family history of melanoma   5. Family history of lung cancer   6. Family history of throat cancer     HISTORY OF PRESENT ILLNESS:   Ms. Galas, a 47 y.o. female, was seen for a [place] cancer genetics consultation at the request of Dr. Denman George due to a personal and family history of cancer.  Ms. Haberman presents to clinic today to discuss the possibility of a hereditary predisposition to cancer, genetic testing, and to further clarify her future cancer risks, as well as potential cancer risks for family members.   In 2019, at the age of 76, Ms. Xiang was diagnosed with endometrial carcinoma. She underwent total hysterectomy.  Her tumor was IHC intact and MSI stable.  Ms. Galeas also has a history of melanoma at 56 and another very early stage melanoma removed at 87.   HORMONAL RISK FACTORS:  First live birth at age N/A.  Ovaries intact: no.  Hysterectomy: yes.  Menopausal status: postmenopausal.  Colonoscopy: no; planning to schedule one before the end of the year (hx of IBS). Mammogram within the last year: yes. Number of breast biopsies: 2. 1 was a fibroadenoma, the other was a pseudohemangiomatous stromal hyperplasia. Denies any abnormal PAP.   Past Medical History:  Diagnosis Date  . Family history of breast cancer   . Family history of lung cancer   . Family history of melanoma   . Family history of prostate cancer   . Family history of throat cancer   . Hypertension   . IBS (irritable bowel syndrome)   . Peripheral vascular disease ([place])   . Skin cancer   . Skin cancer (melanoma) ([place])    1999    Past Surgical History:  Procedure Laterality Date  . BREAST BIOPSY Left 03/04/2016  . BREAST BIOPSY  Left 03/06/2015  . DENTAL SURGERY     w[place]om tooth extraction  . LYMPH NODE BIOPSY N/A 09/12/2017   Procedure: SENTINEL LYMPH NODE BIOPSY;  Surgeon: Everitt Amber, MD;  Location: [facility];  Service: Gynecology;  Laterality: N/A;  . ROBOTIC ASSISTED TOTAL HYSTERECTOMY WITH BILATERAL SALPINGO OOPHERECTOMY Bilateral 09/12/2017   Procedure: XI ROBOTIC ASSISTED TOTAL LAPAROSCOPIC  HYSTERECTOMY WITH BILATERAL SALPINGO OOPHORECTOMY;  Surgeon: Everitt Amber, MD;  Location: [facility];  Service: Gynecology;  Laterality: Bilateral;    Social History   Socioeconomic History  . Marital status: Married    Spouse name: Not on file  . Number of children: Not on file  . Years of education: Not on file  . Highest education level: Not on file  Occupational History  . Not on file  Social Needs  . Financial resource strain: Not on file  . Food insecurity:    Worry: Not on file    Inability: Not on file  . Transportation needs:    Medical: Not on file    Non-medical: Not on file  Tobacco Use  . Smoking status: Never Smoker  . Smokeless tobacco: Never Used  Substance and Sexual Activity  . Alcohol use: Yes    Alcohol/week: 3.0 standard drinks    Types: 3 Glasses of wine per week    Comment: 2 or 3 classes of wine daily  . Drug  use: Never  . Sexual activity: Not on file  Lifestyle  . Physical activity:    Days per week: Not on file    Minutes per session: Not on file  . Stress: Not on file  Relationships  . Social connections:    Talks on phone: Not on file    Gets together: Not on file    Attends religious service: Not on file    Active member of club or organization: Not on file    Attends meetings of clubs or organizations: Not on file    Relationship status: Not on file  Other Topics Concern  . Not on file  Social History Narrative  . Not on file     FAMILY HISTORY:  We obtained a detailed, 4-generation family history.  Significant diagnoses are listed below: Family History  Problem  Relation Age of Onset  . Breast cancer Paternal Grandmother 74       dx at very advanced stage.  . Hypercholesterolemia Mother   . Colon polyps Mother        a few every time, goes every 3 years  . Endometriosis Mother   . Diabetes Father   . Multiple sclerosis Father   . Prostate cancer Father 72       metastatic to bone  . Colon polyps Father   . Diabetes Maternal Aunt   . Melanoma Maternal Aunt 22  . Diabetes Maternal Uncle   . Lung cancer Maternal Uncle   . Throat cancer Maternal Uncle   . Hypertension Maternal Grandmother   . Diabetes Maternal Grandmother   . Diabetes Maternal Grandfather   . Melanoma Maternal Grandfather 56  . Heart attack Maternal Grandfather   . Breast cancer Other        dx >50  . Breast cancer Other        dx >50   Ms. Copado has no biological children.  Ms. Granlund has no siblings.   Ms. Tobler father: 69, was diagnosed with prostate cancer at 66, it is now metastatic to bone.  He has had some colon polyps over the years, and has MS and COPD.  Paternal Aunts/Uncles: 1 paternal aunt is 75 with no history of cancer and 1 paternal uncle is 26 with no history of cancer.  Paternal cousins: no history of cancer.  Paternal grandfather: no history of cancer, died of pneumonia Paternal grandmother:died at 90  Ms. Brasington's mother: 67, has had several polyps removed (a couple every time and she goes every 3 years). She had a hysterectomy in her 40's due to endometriosis.  Maternal Aunts/Uncles: 3 maternal uncles, 1 had lung and throat cancer. 2 maternal aunts, 1 had melanoma in her 58's.  Maternal cousins: no history of cancer Maternal grandfather: hx of melanoma in his 17's, he died of a heart attack Maternal grandmother:no history of cancer.  She had 2 sisters who both had breast cancer (think it was dx >50)  Ms. Raisch is unaware of previous family history of genetic testing for hereditary cancer risks. Patient's maternal ancestors are of N. European  descent, and paternal ancestors are of N. European descent. There is no reported Ashkenazi Jewish ancestry. There is no known consanguinity.  GENETIC COUNSELING ASSESSMENT: EVEY MCMAHAN is a 47 y.o. female with a personal and family history which is somewhat suggestive of a Hereditary Cancer Predisposition Syndrome. We, therefore, discussed and recommended the following at today's visit.   DISCUSSION: We reviewed the characteristics, features and inheritance patterns  of hereditary cancer syndromes. We also discussed genetic testing, including the appropriate family members to test, the process of testing, insurance coverage and turn-around-time for results. We discussed the implications of a negative, positive and/or variant of uncertain significant result. We recommended Ms. Rigsbee pursue genetic testing for the Multi-Cancer Panel gene panel.   The Multi-Cancer Panel offered by [facility] includes sequencing and/or deletion duplication testing of the following 84 genes: AIP,ALK, APC, ATM, AXIN2,BAP1,  BARD1, BLM, BMPR1A, BRCA1, BRCA2, BRIP1, CASR, CDC73, CDH1, CDK4, CDKN1B, CDKN1C, CDKN2A (p14ARF), CDKN2A (p16INK4a), CEBPA, CHEK2, CTNNA1, DICER1, DIS3L2, EGFR (c.2369C>T, p.Thr790Met variant only), EPCAM (Deletion/duplication testing only), FH, FLCN, GATA2, GPC3, GREM1 (Promoter region deletion/duplication testing only), HOXB13 (c.251G>A, p.Gly84Glu), HRAS, KIT, MAX, MEN1, MET, MITF (c.952G>A, p.Glu318Lys variant only), MLH1, MSH2, MSH3, MSH6, MUTYH, NBN, NF1, NF2, NTHL1, PALB2, PDGFRA, PHOX2B, PMS2, POLD1, POLE, POT1, PRKAR1A, PTCH1, PTEN, RAD50, RAD51C, RAD51D, RB1, RECQL4, RET, RUNX1, SDHAF2, SDHA (sequence changes only), SDHB, SDHC, SDHD, SMAD4, SMARCA4, SMARCB1, SMARCE1, STK11, SUFU, TERC, TERT, TMEM127, TP53, TSC1, TSC2, VHL, WRN and WT1.   We discussed that only 5-10% of cancers are associated with a Hereditary Cancer Predisposition Syndrome.  The most common hereditary cancer syndrome associated  with colon cancer is Lynch Syndrome.  Lynch Syndrome is caused by mutations in the genes: MLH1, MSH2, MSH6, PMS2 and EPCAM.  This syndrome increases the risk for colon, uterine, ovarian and stomach cancers, as well as others.  Families with Lynch Syndrome tend to have multiple family members with these cancers, typically diagnosed under age 39, and diagnoses in multiple generations.    Ms. Klinke 's IHC was intact and the tumor is MSS, so the suspicion for Lynch Syndrome is lower. However, these screening testes are not definitve.    One of the most common hereditary cancer syndromes that increases breast/prostate/melanoma risk is called Hereditary Breast and Ovarian Cancer (HBOC) syndrome.  This syndrome is caused by mutations in the BRCA1 and BRCA2 genes.  This syndrome increases an individual's lifetime risk to develop breast, ovarian, pancreatic, and other types of cancer.  There are also many other cancer predisposition syndromes caused by mutations in several other genes.  There are several melanoma risk genes as well.   We discussed that if she is found to have a mutation in one of these genes, it may impact future medical management recommendations such as increased cancer screenings and consideration of risk reducing surgeries.  A positive result could also have implications for the patient's family members.  A Negative result would mean we were unable to identify a hereditary component to her cancer, but does not rule out the possibility of a hereditary basis for her cancer.  There could be mutations that are undetectable by current technology, or in genes not yet tested or identified to increase cancer risk.    We discussed the potential to find a Variant of Uncertain Significance or VUS.  These are variants that have not yet been identified as pathogenic or benign, and it is unknown if this variant is associated with increased cancer risk or if this is a normal finding.  Most VUS's are  reclassified to benign or likely benign.   It should not be used to make medical management decisions. With time, we suspect the lab will determine the significance of any VUS's identified if any.   Based on Ms. Krall's personal and family history of cancer, she meets medical criteria for genetic testing. Despite that she meets criteria, she may still have an out  of pocket cost. The laboratory can provide her with an estimate of her OOP cost.   PLAN: After considering the risks, benefits, and limitations, Ms. Burbridge  provided informed consent to pursue genetic testing and the blood sample was sent to [facility] for analysis of the Multi-Cancer Panel. Results should be available within approximately 2-3 weeks' time, at which point they will be disclosed by telephone to Ms. Morin, as will any additional recommendations warranted by these results. Ms. Bobe will receive a summary of her genetic counseling visit and a copy of her results once available. This information will also be available in Epic. We encouraged Ms. Luka to remain in contact with cancer genetics annually so that we can continuously update the family history and inform her of any changes in cancer genetics and testing that may be of benefit for her family. Ms. Pacifico questions were answered to her satisfaction today. Our contact information was provided should additional questions or concerns arise.  Based on Ms. Bashore's family history, we recommended her father with metastatic prostate cancer also have genetic counseling and testing. Ms. Klimaszewski will let us know if we can be of any assistance in coordinating genetic counseling and/or testing for this family member.   Lastly, we encouraged Ms. Ellerbe to remain in contact with cancer genetics annually so that we can continuously update the family history and inform her of any changes in cancer genetics and testing that may be of benefit for this family.   Ms.   Bond questions were answered to her satisfaction today. Our contact information was provided should additional questions or concerns arise. Thank you for the referral and allowing [place] to share in the care of your patient.   Tana Felts, MS, [facility] Certified Genetic Counselor [email or web address] phone: [phone]  The patient was seen for a total of 45 minutes in face-to-face genetic counseling.

## 2017-11-11 ENCOUNTER — Encounter: Payer: Self-pay | Admitting: Genetics

## 2017-11-11 DIAGNOSIS — Z85820 Personal history of malignant melanoma of skin: Secondary | ICD-10-CM | POA: Insufficient documentation

## 2017-11-11 DIAGNOSIS — C439 Malignant melanoma of skin, unspecified: Secondary | ICD-10-CM

## 2017-11-11 HISTORY — DX: Malignant melanoma of skin, unspecified: C43.9

## 2017-11-14 DIAGNOSIS — F4322 Adjustment disorder with anxiety: Secondary | ICD-10-CM | POA: Diagnosis not present

## 2017-11-24 ENCOUNTER — Telehealth: Payer: Self-pay | Admitting: Genetics

## 2017-11-24 NOTE — Telephone Encounter (Signed)
Revealed negative genetic testing.  Revealed that a VUS in BARD1 was identified.   This normal result is reassuring and indicates that it is unlikely Tina Ray's cancer is due to a hereditary cause.  It is unlikely that there is an increased risk of another cancer due to a mutation in one of these genes.  However, genetic testing is not perfect, and cannot definitively rule out a hereditary cause.  It will be important for her to keep in contact with genetics to learn if any additional testing may be needed in the future.     We still recommended she continue following her phsycians' recommendations for cancer screening/management.  We also recommended her and her relatives regularly see a dermatologist due to the significant family history of melanoma.

## 2017-11-28 DIAGNOSIS — F4322 Adjustment disorder with anxiety: Secondary | ICD-10-CM | POA: Diagnosis not present

## 2017-11-29 DIAGNOSIS — K080 Exfoliation of teeth due to systemic causes: Secondary | ICD-10-CM | POA: Diagnosis not present

## 2017-12-06 ENCOUNTER — Ambulatory Visit: Payer: Self-pay | Admitting: Genetics

## 2017-12-06 ENCOUNTER — Encounter: Payer: Self-pay | Admitting: Genetics

## 2017-12-06 DIAGNOSIS — Z801 Family history of malignant neoplasm of trachea, bronchus and lung: Secondary | ICD-10-CM

## 2017-12-06 DIAGNOSIS — Z800 Family history of malignant neoplasm of digestive organs: Secondary | ICD-10-CM

## 2017-12-06 DIAGNOSIS — Z1379 Encounter for other screening for genetic and chromosomal anomalies: Secondary | ICD-10-CM

## 2017-12-06 DIAGNOSIS — C541 Malignant neoplasm of endometrium: Secondary | ICD-10-CM

## 2017-12-06 DIAGNOSIS — Z803 Family history of malignant neoplasm of breast: Secondary | ICD-10-CM

## 2017-12-06 DIAGNOSIS — F4322 Adjustment disorder with anxiety: Secondary | ICD-10-CM | POA: Diagnosis not present

## 2017-12-06 DIAGNOSIS — Z8042 Family history of malignant neoplasm of prostate: Secondary | ICD-10-CM

## 2017-12-06 DIAGNOSIS — Z808 Family history of malignant neoplasm of other organs or systems: Secondary | ICD-10-CM

## 2017-12-06 NOTE — Progress Notes (Signed)
HPI:  Tina Ray was previously seen in the [place] clinic on 11/10/2017 due to a personal and family history of cancer and concerns regarding a hereditary predisposition to cancer. Please refer to our prior cancer genetics clinic note for more information regarding Tina Ray's medical, social and family histories, and our assessment and recommendations, at the time. Tina Ray recent genetic test results were disclosed to her, as well as recommendations warranted by these results. These results and recommendations are discussed in more detail below.  CANCER HISTORY:  In 2019, at the age of 6, Tina Ray was diagnosed with endometrial carcinoma. She underwent total hysterectomy.  Her tumor was IHC intact and MSI stable.  Tina Ray also has a history of melanoma at 53 and another very early stage melanoma removed at 43.    FAMILY HISTORY:  We obtained a detailed, 4-generation family history.  Significant diagnoses are listed below: Family History  Problem Relation Age of Onset  . Breast cancer Paternal Grandmother 48       dx at very advanced stage.  . Hypercholesterolemia Mother   . Colon polyps Mother        a few every time, goes every 3 years  . Endometriosis Mother   . Diabetes Father   . Multiple sclerosis Father   . Prostate cancer Father 80       metastatic to bone  . Colon polyps Father   . Diabetes Maternal Aunt   . Melanoma Maternal Aunt 74  . Diabetes Maternal Uncle   . Lung cancer Maternal Uncle   . Throat cancer Maternal Uncle   . Hypertension Maternal Grandmother   . Diabetes Maternal Grandmother   . Diabetes Maternal Grandfather   . Melanoma Maternal Grandfather 108  . Heart attack Maternal Grandfather   . Breast cancer Other        dx >50  . Breast cancer Other        dx >50    Tina Ray has no biological children.  Tina Ray has no siblings.   Tina Ray father: 44, was diagnosed with prostate cancer at 10, it is now metastatic  to bone.  He has had some colon polyps over the years, and has MS and COPD.  Paternal Aunts/Uncles: 1 paternal aunt is 99 with no history of cancer and 1 paternal uncle is 53 with no history of cancer.  Paternal cousins: no history of cancer.  Paternal grandfather: no history of cancer, died of pneumonia Paternal grandmother:died at 45  Tina Ray's mother: 55, has had several polyps removed (a couple every time and she goes every 3 years). She had a hysterectomy in her 40's due to endometriosis.  Maternal Aunts/Uncles: 3 maternal uncles, 1 had lung and throat cancer. 2 maternal aunts, 1 had melanoma in her 46's.  Maternal cousins: no history of cancer Maternal grandfather: hx of melanoma in his 26's, he died of a heart attack Maternal grandmother:no history of cancer.  She had 2 sisters who both had breast cancer (think it was dx >50)  Tina Ray is unaware of previous family history of genetic testing for hereditary cancer risks. Patient's maternal ancestors are of N. European descent, and paternal ancestors are of N. European descent. There is no reported Ashkenazi Jewish ancestry. There is no known consanguinity.  GENETIC TEST RESULTS: Genetic testing performed through [facility]'s Multi-Cancer Panel reported out on 11/22/2017 showed no pathogenic mutations. The Multi-Cancer Panel offered by [facility] includes sequencing and/or deletion duplication testing  of the following 91 genes: AIP, ALK, APC, ATM, AXIN2, BAP1, BARD1, BLM, BMPR1A, BRCA1, BRCA2, BRIP1, BUB1B, CASR, CDC73, CDH1, CDK4, CDKN1B, CDKN1C, CDKN2A, CEBPA, CEP57, CHEK2, CTNNA1, DICER1, DIS3L2, EGFR, ENG, EPCAM, FH, FLCN, GALNT12, GATA2, GPC3, GREM1, HOXB13, HRAS, KIT, MAX, MEN1, MET, MC1R, MITF, MLH1, MLH3, MSH2, MSH3, MSH6, MUTYH, NBN, NF1, NF2, NTHL1, PALB2, PDGFRA, PHOX2B, PMS2, POLD1, POLE, POT1, PRKAR1A, PTCH1, PTEN, RAD50, RAD51C, RAD51D, RB1, RECQL4, RET, RNF43, RPS20, RUNX1, SDHA, SDHAF2, SDHB, SDHC, SDHD, SMAD4, SMARCA4, SMARCB1,  SMARCE1, STK11, SUFU, TERC, TERT, TMEM127, TP53, TSC1, TSC2, VHL, WRN, WT1  A variant of uncertain significance (VUS) in a gene called BARD1 was also noted. c.97G>C (p.Ala33Pro)  The test report will be scanned into EPIC and will be located under the Molecular Pathology section of the Results Review tab. A portion of the result report is included below for reference.     We discussed with Tina Ray that because current genetic testing is not perfect, it is possible there may be a gene mutation in one of these genes that current testing cannot detect, but that chance is small.  We also discussed, that there could be another gene that has not yet been discovered, or that we have not yet tested, that is responsible for the cancer diagnoses in the family. It is also possible there is a hereditary cause for the cancer in the family that Tina Ray did not inherit and therefore was not identified in her testing.  Therefore, it is important to remain in touch with cancer genetics in the future so that we can continue to offer Tina Ray the most up to date genetic testing.   Regarding the VUS in BARD1: At this time, it is unknown if this variant is associated with increased cancer risk or if this is a normal finding, but most variants such as this get reclassified to being inconsequential. It should not be used to make medical management decisions. With time, we suspect the lab will determine the significance of this variant, if any. If we do learn more about it, we will try to contact Tina Ray to discuss it further. However, it is important to stay in touch with [place] periodically and keep the address and phone number up to date.  ADDITIONAL GENETIC TESTING: We discussed with Tina Ray that her genetic testing was fairly extensive.  If there are are genes identified to increase cancer risk that can be analyzed in the future, we would be happy to discuss and coordinate this testing at that time.     CANCER SCREENING RECOMMENDATIONS: This negative result means that we were unable to identify a hereditary cause for her personal and family history of cancer at this time.    While reassuring, this does not definitively rule out a hereditary predisposition to cancer. It is still possible that there could be genetic mutations that are undetectable by current technology, or genetic mutations in genes that have not been tested or identified to increase cancer risk.  Therefore, it is recommended she continue to follow the cancer management and screening guidelines provided by her oncology and primary healthcare providers. An individual's cancer risk is not determined by genetic test results alone.  Overall cancer risk assessment includes additional factors such as personal medical history, family history, etc.  These should be used to make a personalized plan for cancer prevention and surveillance.    RECOMMENDATIONS FOR FAMILY MEMBERS:  Relatives in this family might be at some increased risk of developing cancer,  over the general population risk, simply due to the family history of cancer.  We recommended women in this family have a yearly mammogram beginning at age 69, or 76 years younger than the earliest onset of cancer, an annual clinical breast exam, and perform monthly breast self-exams. Women in this family should also have a gynecological exam as recommended by their primary provider. All family members should have a colonoscopy by age 34 (or as directed by their doctors).  All family members should inform their physicians about the family history of cancer so their doctors can make the most appropriate screening recommendations for them.   We recommended Tina Ray and her relatives regularly see a dermatologist due to the significant family history of melanoma.   It is also possible there is a hereditary cause for the cancer in Tina Ray's family that she did not inherit and therefore was not  identified in her.   Therefore, we recommended her father/paternal relatives also have genetic counseling and testing. Ms. Cooperwood will let us know if we can be of any assistance in coordinating genetic counseling and/or testing for these family members.   FOLLOW-UP: Lastly, we discussed with Ms. Enslin that cancer genetics is a rapidly advancing field and it is possible that new genetic tests will be appropriate for her and/or her family members in the future. We encouraged her to remain in contact with cancer genetics on an annual basis so we can update her personal and family histories and let her know of advances in cancer genetics that may benefit this family.   Our contact number was provided. Ms. Lansdale questions were answered to her satisfaction, and she knows she is welcome to call us at anytime with additional questions or concerns.   Tina Luz, MS, [facility] Certified Genetic Counselor [email or web address]

## 2017-12-13 DIAGNOSIS — F4322 Adjustment disorder with anxiety: Secondary | ICD-10-CM | POA: Diagnosis not present

## 2017-12-19 DIAGNOSIS — F4322 Adjustment disorder with anxiety: Secondary | ICD-10-CM | POA: Diagnosis not present

## 2017-12-26 DIAGNOSIS — F4322 Adjustment disorder with anxiety: Secondary | ICD-10-CM | POA: Diagnosis not present

## 2018-01-02 DIAGNOSIS — F4322 Adjustment disorder with anxiety: Secondary | ICD-10-CM | POA: Diagnosis not present

## 2018-01-02 DIAGNOSIS — F429 Obsessive-compulsive disorder, unspecified: Secondary | ICD-10-CM | POA: Diagnosis not present

## 2018-01-09 DIAGNOSIS — F4322 Adjustment disorder with anxiety: Secondary | ICD-10-CM | POA: Diagnosis not present

## 2018-01-09 DIAGNOSIS — F429 Obsessive-compulsive disorder, unspecified: Secondary | ICD-10-CM | POA: Diagnosis not present

## 2018-01-17 DIAGNOSIS — F4322 Adjustment disorder with anxiety: Secondary | ICD-10-CM | POA: Diagnosis not present

## 2018-01-17 DIAGNOSIS — F429 Obsessive-compulsive disorder, unspecified: Secondary | ICD-10-CM | POA: Diagnosis not present

## 2018-02-06 DIAGNOSIS — C541 Malignant neoplasm of endometrium: Secondary | ICD-10-CM | POA: Diagnosis not present

## 2018-02-06 DIAGNOSIS — F4322 Adjustment disorder with anxiety: Secondary | ICD-10-CM | POA: Diagnosis not present

## 2018-02-06 DIAGNOSIS — F429 Obsessive-compulsive disorder, unspecified: Secondary | ICD-10-CM | POA: Diagnosis not present

## 2018-02-06 DIAGNOSIS — G629 Polyneuropathy, unspecified: Secondary | ICD-10-CM | POA: Diagnosis not present

## 2018-02-06 DIAGNOSIS — Z13220 Encounter for screening for lipoid disorders: Secondary | ICD-10-CM | POA: Diagnosis not present

## 2018-02-06 DIAGNOSIS — I10 Essential (primary) hypertension: Secondary | ICD-10-CM | POA: Diagnosis not present

## 2018-02-06 DIAGNOSIS — F418 Other specified anxiety disorders: Secondary | ICD-10-CM | POA: Diagnosis not present

## 2018-02-14 DIAGNOSIS — F429 Obsessive-compulsive disorder, unspecified: Secondary | ICD-10-CM | POA: Diagnosis not present

## 2018-02-14 DIAGNOSIS — F4322 Adjustment disorder with anxiety: Secondary | ICD-10-CM | POA: Diagnosis not present

## 2018-02-21 DIAGNOSIS — Z23 Encounter for immunization: Secondary | ICD-10-CM | POA: Diagnosis not present

## 2018-02-21 DIAGNOSIS — F4322 Adjustment disorder with anxiety: Secondary | ICD-10-CM | POA: Diagnosis not present

## 2018-02-21 DIAGNOSIS — F429 Obsessive-compulsive disorder, unspecified: Secondary | ICD-10-CM | POA: Diagnosis not present

## 2018-02-24 ENCOUNTER — Other Ambulatory Visit (HOSPITAL_COMMUNITY): Payer: Self-pay | Admitting: Diagnostic Radiology

## 2018-02-24 DIAGNOSIS — Z1231 Encounter for screening mammogram for malignant neoplasm of breast: Secondary | ICD-10-CM

## 2018-03-07 DIAGNOSIS — F4322 Adjustment disorder with anxiety: Secondary | ICD-10-CM | POA: Diagnosis not present

## 2018-03-07 DIAGNOSIS — F429 Obsessive-compulsive disorder, unspecified: Secondary | ICD-10-CM | POA: Diagnosis not present

## 2018-03-17 ENCOUNTER — Encounter: Payer: Self-pay | Admitting: Gynecologic Oncology

## 2018-03-17 ENCOUNTER — Inpatient Hospital Stay: Payer: Federal, State, Local not specified - PPO | Attending: Gynecologic Oncology | Admitting: Gynecologic Oncology

## 2018-03-17 VITALS — BP 144/87 | HR 98 | Temp 98.1°F | Resp 20 | Ht 62.5 in | Wt 209.9 lb

## 2018-03-17 DIAGNOSIS — C541 Malignant neoplasm of endometrium: Secondary | ICD-10-CM | POA: Diagnosis not present

## 2018-03-17 DIAGNOSIS — Z90722 Acquired absence of ovaries, bilateral: Secondary | ICD-10-CM | POA: Diagnosis not present

## 2018-03-17 DIAGNOSIS — Z90710 Acquired absence of both cervix and uterus: Secondary | ICD-10-CM | POA: Diagnosis not present

## 2018-03-17 NOTE — Progress Notes (Signed)
Follow-up Note: Gyn-Onc  Consult was initially requested by Dr. Nile Dear for the evaluation of Tina Ray 47 y.o. female  CC:  Chief Complaint  Patient presents with  . Adenocarcinoma of endometrium [facility])    Assessment/Plan:  Tina Ray  is a 47 y.o.  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  Pathology revealed low risk factors for recurrence, therefore no adjuvant therapy is recommended according to NCCN guidelines.  Hereditary BARD1 mutation of uncertain significance.   I discussed risk for recurrence and typical symptoms encouraged her to notify us of these should they develop between visits.  I recommend she have follow-up every 6 months for 5 years in accordance with NCCN guidelines. Those visits should include symptom assessment, physical exam and pelvic examination. Pap smears are not indicated or recommended in the routine surveillance of endometrial cancer.  Is seeing genetics due to young age, family history and personal history of multiple malignancies.   She will return to see me in December, 2020, and Dr Melba Coon annually in June.   HPI: Tina Ray is a 47 year old P0 who is seen in consultation at the request of Dr Melba Coon for grade 1 endometrial cancer.   The patient has a history of previously normal regular menstrual cycles up until December 2018 when she began experiencing intermenstrual bleeding.  She then discussed this with her OB/GYN during in May 2019 routine visit.  This prompted a transvaginal ultrasound scan which identified a 1 cm endometrial polyp.  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  The patient is nulliparous as her husband had had a prior vasectomy.  Patient has a history of 2 prior melanoma surgeries including one  in her 39s.  Her family history is significant for a paternal grandmother with breast cancer at age 41, father with prostate cancer, and a maternal grandfather and 3 additional people on her mother side of the family with melanoma history.  The patient has had no prior abdominal surgeries.  She has hypertension but no diabetes mellitus.  She is overweight and weighs 204 pounds.  Interval Hx:  On 09/12/17 she underwent robotic assisted total hysterectomy, BSO, SLN biopsy. Final pathology revealed a grade 2 endometrioid (no serous features identified on final pathology), non-invasive lesion measuring 0.8cm. There was no LVSI. SLN's, cervix and adnexa were negative. IHC normal for MMR and PCR showed MSI normal.  She was determined to have low risk disease for recurrence, and in accordance with NCCN guidelines, no adjuvant therapy was recommended. Given her young age at diagnosis and prior melanoma history, she qualified for genetics evaluation. Genetics assessment at [facility] was positive for a hereditary deleterious mutation in BARD1 of uncertain significance. She was not recommended additional surveillance for this.   She has done well since our last visit, however her father died at Thanksgiving, 6767 from complications associated with COPD.   Current Meds:  Outpatient Encounter Medications as of 03/17/2018  Medication Sig  . acyclovir (ZOVIRAX) 400 MG tablet Take 400 mg by mouth twice daily as needed for fever blisters  . cyclobenzaprine (FLEXERIL) 10 MG tablet Take 10 mg by mouth 3 (three) times daily as needed for muscle spasms.  . fluticasone (FLONASE) 50 MCG/ACT nasal spray Use 1 spray in each nostril once daily at night  .  ibuprofen (ADVIL,MOTRIN) 200 MG tablet Take 400 mg by mouth 2 (two) times daily as needed for moderate pain.  Marland Kitchen lisinopril-hydrochlorothiazide (PRINZIDE,ZESTORETIC) 20-25 MG tablet Take 1 tablet by mouth daily.  . Menthol, Topical Analgesic, (BIOFREEZE EX) Apply 1  application topically 2 (two) times daily as needed (back pain).   No facility-administered encounter medications on file as of 03/17/2018.     Allergy:  Allergies  Allergen Reactions  . Erythromycin Base Hives  . Sulfa Antibiotics Hives    Social Hx:   Social History   Socioeconomic History  . Marital status: Married    Spouse name: Not on file  . Number of children: Not on file  . Years of education: Not on file  . Highest education level: Not on file  Occupational History  . Not on file  Social Needs  . Financial resource strain: Not on file  . Food insecurity:    Worry: Not on file    Inability: Not on file  . Transportation needs:    Medical: Not on file    Non-medical: Not on file  Tobacco Use  . Smoking status: Never Smoker  . Smokeless tobacco: Never Used  Substance and Sexual Activity  . Alcohol use: Yes    Alcohol/week: 3.0 standard drinks    Types: 3 Glasses of wine per week    Comment: 2 or 3 classes of wine daily  . Drug use: Never  . Sexual activity: Not on file  Lifestyle  . Physical activity:    Days per week: Not on file    Minutes per session: Not on file  . Stress: Not on file  Relationships  . Social connections:    Talks on phone: Not on file    Gets together: Not on file    Attends religious service: Not on file    Active member of club or organization: Not on file    Attends meetings of clubs or organizations: Not on file    Relationship status: Not on file  . Intimate partner violence:    Fear of current or ex partner: Not on file    Emotionally abused: Not on file    Physically abused: Not on file    Forced sexual activity: Not on file  Other Topics Concern  . Not on file  Social History Narrative  . Not on file    Past Surgical Hx:  Past Surgical History:  Procedure Laterality Date  . BREAST BIOPSY Left 03/04/2016  . BREAST BIOPSY Left 03/06/2015  . DENTAL SURGERY     wisedom tooth extraction  . LYMPH NODE BIOPSY N/A  09/12/2017   Procedure: SENTINEL LYMPH NODE BIOPSY;  Surgeon: Everitt Amber, MD;  Location: [facility];  Service: Gynecology;  Laterality: N/A;  . ROBOTIC ASSISTED TOTAL HYSTERECTOMY WITH BILATERAL SALPINGO OOPHERECTOMY Bilateral 09/12/2017   Procedure: XI ROBOTIC ASSISTED TOTAL LAPAROSCOPIC  HYSTERECTOMY WITH BILATERAL SALPINGO OOPHORECTOMY;  Surgeon: Everitt Amber, MD;  Location: [facility];  Service: Gynecology;  Laterality: Bilateral;    Past Medical Hx:  Past Medical History:  Diagnosis Date  . Family history of breast cancer   . Family history of lung cancer   . Family history of melanoma   . Family history of prostate cancer   . Family history of throat cancer   . Hypertension   . IBS (irritable bowel syndrome)   . Melanoma of skin ([place]) 11/11/2017  . Peripheral vascular disease ([place])   . Skin cancer   . Skin  cancer (melanoma) ([place])    1999    Past Gynecological History:   No LMP recorded. (Menstrual status: Irregular Periods).  Family Hx:  Family History  Problem Relation Age of Onset  . Breast cancer Paternal Grandmother 38       dx at very advanced stage.  . Hypercholesterolemia Mother   . Colon polyps Mother        a few every time, goes every 3 years  . Endometriosis Mother   . Diabetes Father   . Multiple sclerosis Father   . Prostate cancer Father 37       metastatic to bone  . Colon polyps Father   . Diabetes Maternal Aunt   . Melanoma Maternal Aunt 75  . Diabetes Maternal Uncle   . Lung cancer Maternal Uncle   . Throat cancer Maternal Uncle   . Hypertension Maternal Grandmother   . Diabetes Maternal Grandmother   . Diabetes Maternal Grandfather   . Melanoma Maternal Grandfather 67  . Heart attack Maternal Grandfather   . Breast cancer Other        dx >50  . Breast cancer Other        dx >50    Review of Systems:  Constitutional  Feels well,    ENT Normal appearing ears and nares bilaterally Skin/Breast  No rash, sores, jaundice, itching,  dryness Cardiovascular  No chest pain, shortness of breath, or edema  Pulmonary  No cough or wheeze.  Gastro Intestinal  No nausea, vomitting, or diarrhoea. No bright red blood per rectum, no abdominal pain, change in bowel movement, or constipation.  Genito Urinary  No frequency, urgency, dysuria, no abnormal uterine bleeding Musculo Skeletal  No myalgia, arthralgia, joint swelling or pain  Neurologic  No weakness, numbness, change in gait,  Psychology  No depression, anxiety, insomnia.   Vitals:  Blood pressure (!) 144/87, pulse 98, temperature 98.1 F (36.7 C), temperature source Oral, resp. rate 20, height 5' 2.5" (1.588 m), weight 209 lb 14.4 oz (95.2 kg), SpO2 100 %.  Physical Exam: WD in NAD Neck  Supple NROM, without any enlargements.  Lymph Node Survey No cervical supraclavicular or inguinal adenopathy Cardiovascular  Pulse normal rate, regularity and rhythm. S1 and S2 normal.  Lungs  Clear to auscultation bilateraly, without wheezes/crackles/rhonchi. Good air movement.  Skin  No rash/lesions/breakdown  Psychiatry  Alert and oriented to person, place, and time  Abdomen  Normoactive bowel sounds, abdomen soft, non-tender and overweight without evidence of hernia.  Back No CVA tenderness Genito Urinary  Vulva/vagina: Normal external female genitalia.   No lesions. No discharge or bleeding.  Bladder/urethra:  No lesions or masses, well supported bladder  Vagina: normal  Cervix: Normal appearing, no lesions.  Uterus:  Small, mobile, no parametrial involvement or nodularity.  Adnexa: no palpable masses. Rectal  deferred Extremities  No bilateral cyanosis, clubbing or edema.  Thereasa Solo, MD  03/17/2018, 12:32 PM

## 2018-03-17 NOTE — Patient Instructions (Addendum)
Please notify Dr Denman George at phone number [phone] if you notice vaginal bleeding, new pelvic or abdominal pains, bloating, feeling full easy, or a change in bladder or bowel function.   Please follow-up with Dr Denman George in December, 2020. Please contact Dr Roe Rutherford office to see her for a wellness check and pelvic exam in June, 2020.

## 2018-03-20 DIAGNOSIS — F4322 Adjustment disorder with anxiety: Secondary | ICD-10-CM | POA: Diagnosis not present

## 2018-03-20 DIAGNOSIS — L299 Pruritus, unspecified: Secondary | ICD-10-CM | POA: Diagnosis not present

## 2018-03-20 DIAGNOSIS — F29 Unspecified psychosis not due to a substance or known physiological condition: Secondary | ICD-10-CM | POA: Diagnosis not present

## 2018-03-20 DIAGNOSIS — H9313 Tinnitus, bilateral: Secondary | ICD-10-CM | POA: Diagnosis not present

## 2018-03-20 DIAGNOSIS — H903 Sensorineural hearing loss, bilateral: Secondary | ICD-10-CM | POA: Diagnosis not present

## 2018-03-21 DIAGNOSIS — L814 Other melanin hyperpigmentation: Secondary | ICD-10-CM | POA: Diagnosis not present

## 2018-03-21 DIAGNOSIS — D225 Melanocytic nevi of trunk: Secondary | ICD-10-CM | POA: Diagnosis not present

## 2018-03-21 DIAGNOSIS — Z872 Personal history of diseases of the skin and subcutaneous tissue: Secondary | ICD-10-CM | POA: Diagnosis not present

## 2018-03-21 DIAGNOSIS — D1801 Hemangioma of skin and subcutaneous tissue: Secondary | ICD-10-CM | POA: Diagnosis not present

## 2018-03-27 DIAGNOSIS — F422 Mixed obsessional thoughts and acts: Secondary | ICD-10-CM | POA: Diagnosis not present

## 2018-03-27 DIAGNOSIS — F429 Obsessive-compulsive disorder, unspecified: Secondary | ICD-10-CM | POA: Diagnosis not present

## 2018-03-31 ENCOUNTER — Telehealth: Payer: Self-pay

## 2018-03-31 NOTE — Telephone Encounter (Signed)
Pt states that she noticed swelling about the size of the palm of her hand in the crease where her buttock cheek meets her thigh. On set 2 days ago. Site not red. Afebrile Area sore to the touch and sitting on it. Reviewed with Dr. Denman George. Pt can apply warm soaks to the site qid. She needs to call her PCP or go to an urgent care to have the site evaluated. Pt verbalized understanding.

## 2018-04-03 DIAGNOSIS — F429 Obsessive-compulsive disorder, unspecified: Secondary | ICD-10-CM | POA: Diagnosis not present

## 2018-04-03 DIAGNOSIS — F4322 Adjustment disorder with anxiety: Secondary | ICD-10-CM | POA: Diagnosis not present

## 2018-04-10 DIAGNOSIS — F429 Obsessive-compulsive disorder, unspecified: Secondary | ICD-10-CM | POA: Diagnosis not present

## 2018-04-10 DIAGNOSIS — F4322 Adjustment disorder with anxiety: Secondary | ICD-10-CM | POA: Diagnosis not present

## 2018-04-11 ENCOUNTER — Ambulatory Visit
Admission: RE | Admit: 2018-04-11 | Discharge: 2018-04-11 | Disposition: A | Discharge status: Home / Self Care | Payer: Federal, State, Local not specified - PPO | Source: Ambulatory Visit | Attending: Diagnostic Radiology | Admitting: Diagnostic Radiology

## 2018-04-11 DIAGNOSIS — Z1231 Encounter for screening mammogram for malignant neoplasm of breast: Secondary | ICD-10-CM | POA: Diagnosis not present

## 2018-04-24 DIAGNOSIS — F4322 Adjustment disorder with anxiety: Secondary | ICD-10-CM | POA: Diagnosis not present

## 2018-04-24 DIAGNOSIS — F429 Obsessive-compulsive disorder, unspecified: Secondary | ICD-10-CM | POA: Diagnosis not present

## 2018-05-08 DIAGNOSIS — F4322 Adjustment disorder with anxiety: Secondary | ICD-10-CM | POA: Diagnosis not present

## 2018-05-08 DIAGNOSIS — F429 Obsessive-compulsive disorder, unspecified: Secondary | ICD-10-CM | POA: Diagnosis not present

## 2018-05-11 DIAGNOSIS — K080 Exfoliation of teeth due to systemic causes: Secondary | ICD-10-CM | POA: Diagnosis not present

## 2018-05-11 DIAGNOSIS — D485 Neoplasm of uncertain behavior of skin: Secondary | ICD-10-CM | POA: Diagnosis not present

## 2018-05-15 DIAGNOSIS — K080 Exfoliation of teeth due to systemic causes: Secondary | ICD-10-CM | POA: Diagnosis not present

## 2018-05-23 DIAGNOSIS — F4322 Adjustment disorder with anxiety: Secondary | ICD-10-CM | POA: Diagnosis not present

## 2018-05-23 DIAGNOSIS — F429 Obsessive-compulsive disorder, unspecified: Secondary | ICD-10-CM | POA: Diagnosis not present

## 2018-05-31 DIAGNOSIS — R0789 Other chest pain: Secondary | ICD-10-CM | POA: Diagnosis not present

## 2018-06-06 DIAGNOSIS — F429 Obsessive-compulsive disorder, unspecified: Secondary | ICD-10-CM | POA: Diagnosis not present

## 2018-06-06 DIAGNOSIS — F4322 Adjustment disorder with anxiety: Secondary | ICD-10-CM | POA: Diagnosis not present

## 2018-06-19 DIAGNOSIS — F4322 Adjustment disorder with anxiety: Secondary | ICD-10-CM | POA: Diagnosis not present

## 2018-06-19 DIAGNOSIS — F429 Obsessive-compulsive disorder, unspecified: Secondary | ICD-10-CM | POA: Diagnosis not present

## 2018-07-03 ENCOUNTER — Other Ambulatory Visit: Payer: Self-pay | Admitting: Physician Assistant

## 2018-07-03 DIAGNOSIS — G629 Polyneuropathy, unspecified: Secondary | ICD-10-CM | POA: Diagnosis not present

## 2018-07-03 DIAGNOSIS — G609 Hereditary and idiopathic neuropathy, unspecified: Secondary | ICD-10-CM

## 2018-07-03 DIAGNOSIS — Z13220 Encounter for screening for lipoid disorders: Secondary | ICD-10-CM | POA: Diagnosis not present

## 2018-07-03 DIAGNOSIS — E538 Deficiency of other specified B group vitamins: Secondary | ICD-10-CM | POA: Diagnosis not present

## 2018-07-11 DIAGNOSIS — Z0000 Encounter for general adult medical examination without abnormal findings: Secondary | ICD-10-CM | POA: Diagnosis not present

## 2018-08-21 ENCOUNTER — Other Ambulatory Visit: Payer: Federal, State, Local not specified - PPO

## 2018-08-30 DIAGNOSIS — Z01419 Encounter for gynecological examination (general) (routine) without abnormal findings: Secondary | ICD-10-CM | POA: Diagnosis not present

## 2018-08-30 DIAGNOSIS — Z6837 Body mass index (BMI) 37.0-37.9, adult: Secondary | ICD-10-CM | POA: Diagnosis not present

## 2018-08-30 DIAGNOSIS — Z1389 Encounter for screening for other disorder: Secondary | ICD-10-CM | POA: Diagnosis not present

## 2018-08-30 DIAGNOSIS — R61 Generalized hyperhidrosis: Secondary | ICD-10-CM | POA: Diagnosis not present

## 2018-08-30 DIAGNOSIS — Z113 Encounter for screening for infections with a predominantly sexual mode of transmission: Secondary | ICD-10-CM | POA: Diagnosis not present

## 2018-08-30 DIAGNOSIS — Z9189 Other specified personal risk factors, not elsewhere classified: Secondary | ICD-10-CM | POA: Diagnosis not present

## 2018-08-30 DIAGNOSIS — Z130 Encounter for screening for diseases of the blood and blood-forming organs and certain disorders involving the immune mechanism: Secondary | ICD-10-CM | POA: Diagnosis not present

## 2018-09-04 ENCOUNTER — Ambulatory Visit
Admission: RE | Admit: 2018-09-04 | Discharge: 2018-09-04 | Disposition: A | Discharge status: Home / Self Care | Payer: Federal, State, Local not specified - PPO | Source: Ambulatory Visit | Attending: Physician Assistant | Admitting: Physician Assistant

## 2018-09-04 ENCOUNTER — Other Ambulatory Visit: Payer: Self-pay

## 2018-09-04 DIAGNOSIS — M79675 Pain in left toe(s): Secondary | ICD-10-CM | POA: Diagnosis not present

## 2018-09-04 DIAGNOSIS — M79674 Pain in right toe(s): Secondary | ICD-10-CM | POA: Diagnosis not present

## 2018-09-04 DIAGNOSIS — G609 Hereditary and idiopathic neuropathy, unspecified: Secondary | ICD-10-CM

## 2018-09-05 ENCOUNTER — Other Ambulatory Visit: Payer: Self-pay | Admitting: Obstetrics and Gynecology

## 2018-09-05 DIAGNOSIS — R922 Inconclusive mammogram: Secondary | ICD-10-CM

## 2018-10-02 ENCOUNTER — Ambulatory Visit: Payer: Federal, State, Local not specified - PPO

## 2018-10-03 DIAGNOSIS — L814 Other melanin hyperpigmentation: Secondary | ICD-10-CM | POA: Diagnosis not present

## 2018-10-03 DIAGNOSIS — L821 Other seborrheic keratosis: Secondary | ICD-10-CM | POA: Diagnosis not present

## 2018-10-03 DIAGNOSIS — L308 Other specified dermatitis: Secondary | ICD-10-CM | POA: Diagnosis not present

## 2018-10-03 DIAGNOSIS — D225 Melanocytic nevi of trunk: Secondary | ICD-10-CM | POA: Diagnosis not present

## 2018-10-09 DIAGNOSIS — M542 Cervicalgia: Secondary | ICD-10-CM | POA: Diagnosis not present

## 2018-10-13 ENCOUNTER — Encounter: Payer: Self-pay | Admitting: Physical Therapy

## 2018-10-13 ENCOUNTER — Ambulatory Visit: Payer: Federal, State, Local not specified - PPO | Attending: Family Medicine | Admitting: Physical Therapy

## 2018-10-13 ENCOUNTER — Other Ambulatory Visit: Payer: Self-pay

## 2018-10-13 DIAGNOSIS — M542 Cervicalgia: Secondary | ICD-10-CM | POA: Insufficient documentation

## 2018-10-13 DIAGNOSIS — M6281 Muscle weakness (generalized): Secondary | ICD-10-CM

## 2018-10-13 DIAGNOSIS — M79622 Pain in left upper arm: Secondary | ICD-10-CM | POA: Insufficient documentation

## 2018-10-13 DIAGNOSIS — R293 Abnormal posture: Secondary | ICD-10-CM | POA: Diagnosis not present

## 2018-10-13 NOTE — Patient Instructions (Signed)
Access Code: [email or web address]  URL: [email or web address]  Date: 10/13/2018  Prepared by: Jari Favre   Exercises  Correct Seated Posture - 10 reps - 3 sets - 1x daily - 7x weekly  Seated Scapular Retraction - 10 reps - 1 sets - 5 sec hold - 3x daily - 7x weekly  Doorway Pec Stretch at 90 Degrees Abduction - 3 reps - 1 sets - 30 sec hold - 1x daily - 7x weekly

## 2018-10-13 NOTE — Therapy (Addendum)
[facility] [address], [place], [place], [address] Phone: [phone]   Fax:  [phone]  Physical Therapy Evaluation  Patient Details  Name: Tina Ray MRN: [id number] Date of Birth: 18-Jul-1970 Referring Provider (PT): Orpah Melter, MD   Encounter Date: 10/13/2018  PT End of Session - 10/13/18 0932    Visit Number  1    Date for PT Re-Evaluation  12/08/18    PT Start Time  0836    PT Stop Time  0927    PT Time Calculation (min)  51 min    Activity Tolerance  Patient tolerated treatment well    Behavior During Therapy  [facility] for tasks assessed/performed       Past Medical History:  Diagnosis Date  . Family history of breast cancer   . Family history of lung cancer   . Family history of melanoma   . Family history of prostate cancer   . Family history of throat cancer   . Hypertension   . IBS (irritable bowel syndrome)   . Melanoma of skin ([place]) 11/11/2017  . Peripheral vascular disease ([place])   . Skin cancer   . Skin cancer (melanoma) ([place])    1999    Past Surgical History:  Procedure Laterality Date  . BREAST BIOPSY Left 03/04/2016  . BREAST BIOPSY Left 03/06/2015  . DENTAL SURGERY     wisedom tooth extraction  . LYMPH NODE BIOPSY N/A 09/12/2017   Procedure: SENTINEL LYMPH NODE BIOPSY;  Surgeon: Everitt Amber, MD;  Location: [facility];  Service: Gynecology;  Laterality: N/A;  . ROBOTIC ASSISTED TOTAL HYSTERECTOMY WITH BILATERAL SALPINGO OOPHERECTOMY Bilateral 09/12/2017   Procedure: XI ROBOTIC ASSISTED TOTAL LAPAROSCOPIC  HYSTERECTOMY WITH BILATERAL SALPINGO OOPHORECTOMY;  Surgeon: Everitt Amber, MD;  Location: [facility];  Service: Gynecology;  Laterality: Bilateral;    There were no vitals filed for this visit.   Subjective Assessment - 10/13/18 0843    Subjective  Pt has had neck pain for years.  Two months ago it got more severe where it was every day.  Moving into the arm one week ago and now  feeling tinging in thumb and index finger.    Diagnostic tests  x-ray showing severe narrowing of C6-7 disc space    Patient Stated Goals  get rid of pain, be able to back up with the car for getting out of a parking, be able to work without pain    Currently in Pain?  Yes    Pain Score  6    9/10   Pain Location  Neck    Pain Descriptors / Indicators  Radiating;Aching;Heaviness    Pain Type  Chronic pain    Pain Radiating Towards  from neck down upper trap and pain in the arm feels like throbbing/burning    Pain Onset  More than a month ago    Pain Frequency  Constant    Aggravating Factors   sleeping - wakes every hour    Pain Relieving Factors  lying on the left side helps the left arm but the neck hurts in every position; hot shower used to ease up but this week it has not    Effect of Pain on Daily Activities  not wanting to run errands, sitting and working on the computer         [facility] PT Assessment - 10/13/18 0001      Assessment   Medical Diagnosis  M54.2 (ICD-10-CM) - Cervicalgia  Referring Provider (PT)  Orpah Melter, MD    Hand Dominance  Right    Prior Therapy  No      Precautions   Precautions  None      Restrictions   Weight Bearing Restrictions  No      Balance Screen   Has the patient fallen in the past 6 months  No      [place] residence    Living Arrangements  Spouse/significant other      Prior Function   Level of Independence  Independent    Vocation  Full time employment    Vocation Requirements  computer    Leisure  cooking, walking      Cognition   Overall Cognitive Status  Within Functional Limits for tasks assessed      Observation/Other Assessments   Focus on Therapeutic Outcomes (FOTO)   unfinished - needs medical history, height and weight      Posture/Postural Control   Posture/Postural Control  Postural limitations    Postural Limitations  Rounded Shoulders;Forward head;Increased lumbar  lordosis;Increased thoracic kyphosis      ROM / Strength   AROM / PROM / Strength  AROM;Strength      AROM   Overall AROM Comments  shoulder flex Rt 166; Lt 139    AROM Assessment Site  Cervical    Cervical - Right Side Bend  16   feels tight   Cervical - Left Side Bend  20   feels pain on left   Cervical - Right Rotation  50    Cervical - Left Rotation  30      Strength   Overall Strength Comments  cervical rotation Lt 4-/5 +pain; Rt 4+/5 a little discomfort    Strength Assessment Site  Shoulder    Right/Left Shoulder  Right;Left    Right Shoulder Flexion  4+/5    Right Shoulder ABduction  4+/5    Right Shoulder Horizontal ABduction  4+/5    Right Shoulder Horizontal ADduction  5/5    Left Shoulder Flexion  4/5    Left Shoulder ABduction  4-/5   pain   Left Shoulder Horizontal ABduction  4/5    Left Shoulder Horizontal ADduction  5/5    Right Hand Grip (lbs)  50    Left Hand Grip (lbs)  35      Palpation   Palpation comment  Tigth and TTP Lt>Rt upper traps, scalenes, cervical paraspinals, suboccipitals, Lt pectoraslis, TTPposterior humeral head      Special Tests    Special Tests  Cervical    Cervical Tests  Spurling's      Spurling's   Findings  Positive    Side  --   both Lt>Rt     Ambulation/Gait   Gait Pattern  Within Functional Limits                Objective measurements completed on examination: See above findings.      [place] Adult PT Treatment/Exercise - 10/13/18 0001      Self-Care   Self-Care  Other Self-Care Comments    Other Self-Care Comments   educated and performed posture and initial HEP             PT Education - 10/13/18 0932    Education Details  Access Code: [email or web address]    Person(s) Educated  Patient    Methods  Explanation;Demonstration;Handout;Verbal cues    Comprehension  Verbalized understanding;Returned demonstration  PT Short Term Goals - 10/13/18 0947      PT SHORT TERM GOAL #1   Title  pt will report  centralizing of symptoms with no symptoms in Lt arm    Time  4    Period  Weeks    Status  New    Target Date  11/10/18      PT SHORT TERM GOAL #2   Title  Pt will report 30% less pain for improved sleep    Time  4    Period  Weeks    Status  New    Target Date  11/10/18        PT Long Term Goals - 10/13/18 0934      PT LONG TERM GOAL #1   Title  pt will be able to roll easily in bed without increased pain    Time  8    Period  Weeks    Status  New    Target Date  12/08/18      PT LONG TERM GOAL #2   Title  Pt will report 70% less pain at night for improved sleep    Time  8    Period  Weeks    Status  New    Target Date  12/08/18      PT LONG TERM GOAL #3   Title  Pt will demonstrate at least 50 degrees of cervical rotation  bilaterally for ability to back out of parking spaces    Baseline  Lt 30 deg; Rt 50 deg    Time  8    Period  Weeks    Status  New    Target Date  12/08/18      PT LONG TERM GOAL #4   Title  Pt will demonstrate improved UE strength to MMT 5/5 shoulder flexion and abduction bilaterally for ability to do household chores.    Time  8    Period  Weeks    Status  New    Target Date  12/08/18      PT LONG TERM GOAL #5   Title  pt will be ind with advanced HEP    Time  8    Period  Weeks    Status  New    Target Date  12/08/18             Plan - 10/13/18 0955    Clinical Impression Statement  Pt presents to skilled PT with chronic neck pain that has recently been worsening.  She has muscle tension and TTP Lt>Rt throughout suboccipitals, cervical paraspinals, upper traps, and scalenes.  She has tenderness at posterior humeral head on Lt side and decreased AROM Lt UE.  She is getting pain that radiates down to left elbow and has  tingling in left hand.  Pt demostrates weakness Lt>Rt througout UE.  She has decreased cervical ROM and weak neck rotation with MMT Lt>Rt.  Pt has posture abnormalities as mentioned above.  She will benefit from  skilled PTto address impairments so she can return to maximum funciton and improve sleep.    Personal Factors and Comorbidities  Profession;Comorbidity 1    Comorbidities  chronic pain    Examination-Activity Limitations  Reach Overhead;Carry;Sleep;Other   driving   Stability/Clinical Decision Making  Evolving/Moderate complexity    Clinical Decision Making  Moderate    Rehab Potential  Excellent    PT Frequency  2x / week    PT Duration  8 weeks  PT Treatment/Interventions  ADLs/Self Care Home Management;Cryotherapy;Electrical Stimulation;Moist Heat;Traction;Therapeutic activities;Therapeutic exercise;Neuromuscular re-education;Manual techniques;Patient/family education;Passive range of motion;Dry needling;Taping    PT Next Visit Plan  finish FOTO, possibly dry needling upper trap and cervical multifidi, review posture, cervical ROM and retractions, mechanical traction    PT Home Exercise Plan  Access Code: [email or web address]    Consulted and Agree with Plan of Care  Patient       Patient will benefit from skilled therapeutic intervention in order to improve the following deficits and impairments:  Pain, Increased fascial restricitons, Increased muscle spasms, Postural dysfunction, Decreased range of motion, Decreased strength  Visit Diagnosis: 1. Cervicalgia   2. Muscle weakness (generalized)   3. Abnormal posture   4. Pain in left upper arm        Problem List Patient Active Problem List   Diagnosis Date Noted  . Genetic testing 12/06/2017  . Melanoma of skin ([place]) 11/11/2017  . Family history of prostate cancer   . Family history of breast cancer   . Family history of melanoma   . Family history of lung cancer   . Family history of throat cancer   . Adenocarcinoma of endometrium ([place]) 08/23/2017    Jule Ser, PT 10/13/2018, 4:31 PM  [facility] [address], [place], [place], [address] Phone: [phone]    Fax:  [phone]  Name: JULLIANA WHITMYER MRN: [id number] Date of Birth: [birth year]

## 2018-10-16 ENCOUNTER — Other Ambulatory Visit: Payer: Self-pay

## 2018-10-16 ENCOUNTER — Encounter: Payer: Self-pay | Admitting: Physical Therapy

## 2018-10-16 ENCOUNTER — Ambulatory Visit: Payer: Federal, State, Local not specified - PPO | Admitting: Physical Therapy

## 2018-10-16 DIAGNOSIS — M6281 Muscle weakness (generalized): Secondary | ICD-10-CM | POA: Diagnosis not present

## 2018-10-16 DIAGNOSIS — R293 Abnormal posture: Secondary | ICD-10-CM | POA: Diagnosis not present

## 2018-10-16 DIAGNOSIS — M542 Cervicalgia: Secondary | ICD-10-CM

## 2018-10-16 DIAGNOSIS — M79622 Pain in left upper arm: Secondary | ICD-10-CM | POA: Diagnosis not present

## 2018-10-16 NOTE — Therapy (Signed)
[facility] [address] [place], [place], [address] Phone: [phone]   Fax:  [phone]  Physical Therapy Treatment  Patient Details  Name: Tina Ray MRN: [id number] Date of Birth: 05-Mar-1971 Referring Provider (PT): Orpah Melter, MD   Encounter Date: 10/16/2018  PT End of Session - 10/16/18 0853    Visit Number  2    Date for PT Re-Evaluation  12/08/18    Authorization Type  BCBS    PT Start Time  0808    PT Stop Time  0850    PT Time Calculation (min)  42 min    Activity Tolerance  Patient tolerated treatment well    Behavior During Therapy  [facility] for tasks assessed/performed       Past Medical History:  Diagnosis Date  . Family history of breast cancer   . Family history of lung cancer   . Family history of melanoma   . Family history of prostate cancer   . Family history of throat cancer   . Hypertension   . IBS (irritable bowel syndrome)   . Melanoma of skin ([place]) 11/11/2017  . Peripheral vascular disease ([place])   . Skin cancer   . Skin cancer (melanoma) ([place])    1999    Past Surgical History:  Procedure Laterality Date  . BREAST BIOPSY Left 03/04/2016  . BREAST BIOPSY Left 03/06/2015  . DENTAL SURGERY     wisedom tooth extraction  . LYMPH NODE BIOPSY N/A 09/12/2017   Procedure: SENTINEL LYMPH NODE BIOPSY;  Surgeon: Everitt Amber, MD;  Location: [facility];  Service: Gynecology;  Laterality: N/A;  . ROBOTIC ASSISTED TOTAL HYSTERECTOMY WITH BILATERAL SALPINGO OOPHERECTOMY Bilateral 09/12/2017   Procedure: XI ROBOTIC ASSISTED TOTAL LAPAROSCOPIC  HYSTERECTOMY WITH BILATERAL SALPINGO OOPHORECTOMY;  Surgeon: Everitt Amber, MD;  [facility];  Service: Gynecology;  Laterality: Bilateral;    There were no vitals filed for this visit.  Subjective Assessment - 10/16/18 0809    Subjective  I still wake up alot. I have pain down the left arm and thumb. My left thumb is numb when I wake up.     Diagnostic tests  x-ray showing severe narrowing of C6-7 disc space    Patient Stated Goals  get rid of pain, be able to back up with the car for getting out of a parking, be able to work without pain    Currently in Pain?  Yes    Pain Score  5    night goes to 8-9/10   Pain Location  Neck    Pain Orientation  Mid;Lower    Pain Descriptors / Indicators  Radiating;Aching;Heaviness    Pain Type  Chronic pain    Pain Onset  More than a month ago    Pain Frequency  Constant    Aggravating Factors   sleeping, wakes every hour    Pain Relieving Factors  lying on the lef side helps the left arm but the neck hurts in every postion, hot shower used to ease up but this week    Multiple Pain Sites  No                       [facility] Adult PT Treatment/Exercise - 10/16/18 0001      Therapeutic Activites    Therapeutic Activities  Other Therapeutic Activities;ADL's;Work Simulation    ADL's  getting in and out of bed with spinal neutral    Work [employer]  sitting at the computer and altering her position to decrease strain on the cervical    Other Therapeutic Activities  instructed patient on ways to sleep with adjusting her pillow, adding pillows under shoulder and between legs      Neck Exercises: Supine   Neck Retraction  10 reps;5 secs    Lateral Flexion  Left;5 reps   but incresaed left shoulder pain     Manual Therapy   Manual Therapy  Soft tissue mobilization;Joint mobilization    Joint Mobilization  side glide to C2-T2 in spine, first rib mobilization to left     Soft tissue mobilization  cervical and upper thoracic paraspinals, suboccipitals for elongation       Trigger Point Dry Needling - 10/16/18 0001    Consent Given?  Yes    Education Handout Provided  Yes    Muscles Treated Head and Neck  Cervical multifidi    Dry Needling Comments  bil. T1-T3 multifidi    Cervical multifidi Response  Twitch reponse elicited;Palpable increased muscle length           PT  Education - 10/16/18 0850    Education Details  Access Code: [email or web address]    Person(s) Educated  Patient    Methods  Explanation;Demonstration;Verbal cues;Handout    Comprehension  Returned demonstration;Verbalized understanding       PT Short Term Goals - 10/13/18 0947      PT SHORT TERM GOAL #1   Title  pt will report centralizing of symptoms with no symptoms in Lt arm    Time  4    Period  Weeks    Status  New    Target Date  11/10/18      PT SHORT TERM GOAL #2   Title  Pt will report 30% less pain for improved sleep    Time  4    Period  Weeks    Status  New    Target Date  11/10/18        PT Long Term Goals - 10/13/18 0934      PT LONG TERM GOAL #1   Title  pt will be able to roll easily in bed without increased pain    Time  8    Period  Weeks    Status  New    Target Date  12/08/18      PT LONG TERM GOAL #2   Title  Pt will report 70% less pain at night for improved sleep    Time  8    Period  Weeks    Status  New    Target Date  12/08/18      PT LONG TERM GOAL #3   Title  Pt will demonstrate at least 50 degrees of cervical rotation  bilaterally for ability to back out of parking spaces    Baseline  Lt 30 deg; Rt 50 deg    Time  8    Period  Weeks    Status  New    Target Date  12/08/18      PT LONG TERM GOAL #4   Title  Pt will demonstrate improved UE strength to MMT 5/5 shoulder flexion and abduction bilaterally for ability to do household chores.    Time  8    Period  Weeks    Status  New    Target Date  12/08/18      PT LONG TERM GOAL #5   Title  pt will be ind  with advanced HEP    Time  8    Period  Weeks    Status  New    Target Date  12/08/18            Plan - 10/16/18 0819    Clinical Impression Statement  Patient understands correct body mechanics with her work station, sleeping, getting in and out of bed an working on her phone. Patient has tightness in the cervial and upper thoracic muscles. Patient has limited first left rib  movement. Patient had less pain and numbness in the left thum after treatment. Patient will benefit from skilled PT to address impairments so she can return to maximum function and improve sleep.    Personal Factors and Comorbidities  Profession;Comorbidity 1    Comorbidities  chronic pain    Examination-Activity Limitations  Reach Overhead;Carry;Sleep;Other    Stability/Clinical Decision Making  Evolving/Moderate complexity    Rehab Potential  Excellent    PT Frequency  2x / week    PT Duration  8 weeks    PT Treatment/Interventions  ADLs/Self Care Home Management;Cryotherapy;Electrical Stimulation;Moist Heat;Traction;Therapeutic activities;Therapeutic exercise;Neuromuscular re-education;Manual techniques;Patient/family education;Passive range of motion;Dry needling;Taping    PT Next Visit Plan  finish FOTO, assess dry needling upper trap and cervical multifidi,  cervical ROM and retractions, mechanical traction    PT Home Exercise Plan  Access Code: [email or web address]    Consulted and Agree with Plan of Care  Patient       Patient will benefit from skilled therapeutic intervention in order to improve the following deficits and impairments:  Pain, Increased fascial restricitons, Increased muscle spasms, Postural dysfunction, Decreased range of motion, Decreased strength  Visit Diagnosis: 1. Cervicalgia   2. Muscle weakness (generalized)   3. Abnormal posture   4. Pain in left upper arm        Problem List Patient Active Problem List   Diagnosis Date Noted  . Genetic testing 12/06/2017  . Melanoma of skin ([place]) 11/11/2017  . Family history of prostate cancer   . Family history of breast cancer   . Family history of melanoma   . Family history of lung cancer   . Family history of throat cancer   . 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, [place], [place], [address] Phone: [phone]   Fax:  [phone]  Name: SHEREDA GRAW MRN: [id number] Date of Birth: 03/14/1971

## 2018-10-16 NOTE — Patient Instructions (Signed)
Access Code: [email or web address]  URL: [email or web address]  Date: 10/16/2018  Prepared by: Earlie Counts   Exercises Correct Seated Posture - 10 reps - 3 sets - 1x daily - 7x weekly Seated Scapular Retraction - 10 reps - 1 sets - 5 sec hold - 3x daily - 7x weekly Doorway Pec Stretch at 90 Degrees Abduction - 3 reps - 1 sets - 30 sec hold - 1x daily - 7x weekly Supine Chin Tuck - 5 reps - 1 sets - sec hold - 1x daily - 7x weekly Median Nerve Tensioner - 5 reps - 1 sets - 1 sec hold - 3x daily - 7x weekly Patient Education Cervical Stenosis Acute Neck Pain Handout Office Posture Forward Head Posture Trigger Point Dry Needling  [facility] [address] [place],  [address] Phone # [phone] Fax [phone]

## 2018-10-20 ENCOUNTER — Ambulatory Visit: Payer: Federal, State, Local not specified - PPO | Admitting: Physical Therapy

## 2018-10-20 ENCOUNTER — Other Ambulatory Visit: Payer: Self-pay

## 2018-10-20 ENCOUNTER — Encounter: Payer: Self-pay | Admitting: Physical Therapy

## 2018-10-20 DIAGNOSIS — R293 Abnormal posture: Secondary | ICD-10-CM | POA: Diagnosis not present

## 2018-10-20 DIAGNOSIS — M79622 Pain in left upper arm: Secondary | ICD-10-CM | POA: Diagnosis not present

## 2018-10-20 DIAGNOSIS — M542 Cervicalgia: Secondary | ICD-10-CM

## 2018-10-20 DIAGNOSIS — M6281 Muscle weakness (generalized): Secondary | ICD-10-CM | POA: Diagnosis not present

## 2018-10-20 NOTE — Therapy (Signed)
[facility] [address], [place], [place], [address] Phone: [phone]   Fax:  [phone]  Physical Therapy Treatment  Patient Details  Name: Tina Ray MRN: [id number] Date of Birth: 05-31-1970 Referring Provider (PT): Orpah Melter, MD   Encounter Date: 10/20/2018  PT End of Session - 10/20/18 0854    Visit Number  3    Date for PT Re-Evaluation  12/08/18    Authorization Type  BCBS    PT Start Time  0805    PT Stop Time  0845    PT Time Calculation (min)  40 min    Activity Tolerance  Patient tolerated treatment well    Behavior During Therapy  [facility] for tasks assessed/performed       Past Medical History:  Diagnosis Date  . Family history of breast cancer   . Family history of lung cancer   . Family history of melanoma   . Family history of prostate cancer   . Family history of throat cancer   . Hypertension   . IBS (irritable bowel syndrome)   . Melanoma of skin ([place]) 11/11/2017  . Peripheral vascular disease ([place])   . Skin cancer   . Skin cancer (melanoma) ([place])    1999    Past Surgical History:  Procedure Laterality Date  . BREAST BIOPSY Left 03/04/2016  . BREAST BIOPSY Left 03/06/2015  . DENTAL SURGERY     wisedom tooth extraction  . LYMPH NODE BIOPSY N/A 09/12/2017   Procedure: SENTINEL LYMPH NODE BIOPSY;  Surgeon: Everitt Amber, MD;  [facility];  Service: Gynecology;  Laterality: N/A;  . ROBOTIC ASSISTED TOTAL HYSTERECTOMY WITH BILATERAL SALPINGO OOPHERECTOMY Bilateral 09/12/2017   Procedure: XI ROBOTIC ASSISTED TOTAL LAPAROSCOPIC  HYSTERECTOMY WITH BILATERAL SALPINGO OOPHORECTOMY;  Surgeon: Everitt Amber, MD;  [facility];  Service: Gynecology;  Laterality: Bilateral;    There were no vitals filed for this visit.  Subjective Assessment - 10/20/18 0807    Subjective  Pt states that she is in alot of pain today. She woke up and her pain was almost a 8 or 9 out of 10. She  currently has numbness in the thumb but as the day goes on it will typically ease up a bit.    Diagnostic tests  x-ray showing severe narrowing of C6-7 disc space    Patient Stated Goals  get rid of pain, be able to back up with the car for getting out of a parking, be able to work without pain    Currently in Pain?  Yes    Pain Score  6     Pain Location  Shoulder    Pain Orientation  Left    Pain Descriptors / Indicators  Aching;Tingling    Pain Type  Chronic pain    Pain Radiating Towards  down shoulder and currently skips to the thumb    Pain Onset  More than a month ago    Pain Frequency  Constant    Aggravating Factors   sleeping on the Rt side is worse than Lt    Pain Relieving Factors  sleeping on the Lt side    Effect of Pain on Daily Activities  limited activity participation                       [facility] Adult PT Treatment/Exercise - 10/20/18 0001      Self-Care   Self-Care  Posture  Posture  sleeping position avoiding neural stretch while on Rt or Lt side, therapist demo      Exercises   Exercises  Shoulder      Neck Exercises: Supine   Neck Retraction  10 reps;5 secs    Neck Retraction Limitations  verbal cues for chin tuck      Shoulder Exercises: Supine   Horizontal ABduction  Both;Strengthening;10 reps    Theraband Level (Shoulder Horizontal ABduction)  Level 1 (Yellow)    Other Supine Exercises  scap retraction x12 reps       Manual Therapy   Manual therapy comments  Proximal neural stretch during Lt side glide at C5, C6, C7    Joint Mobilization  Lt side glide C5 to C7 x2 bouts; Lt 1st rib inferior mobilization and stretch; grade III CPAs T4 to C4 x2 bouts     Soft tissue mobilization  upper trap/levator scap/scalenes             PT Education - 10/20/18 0854    Education Details  technique with therex; sleeping posture    Person(s) Educated  Patient    Methods  Explanation;Verbal cues    Comprehension  Verbalized understanding        PT Short Term Goals - 10/13/18 0947      PT SHORT TERM GOAL #1   Title  pt will report centralizing of symptoms with no symptoms in Lt arm    Time  4    Period  Weeks    Status  New    Target Date  11/10/18      PT SHORT TERM GOAL #2   Title  Pt will report 30% less pain for improved sleep    Time  4    Period  Weeks    Status  New    Target Date  11/10/18        PT Long Term Goals - 10/13/18 0934      PT LONG TERM GOAL #1   Title  pt will be able to roll easily in bed without increased pain    Time  8    Period  Weeks    Status  New    Target Date  12/08/18      PT LONG TERM GOAL #2   Title  Pt will report 70% less pain at night for improved sleep    Time  8    Period  Weeks    Status  New    Target Date  12/08/18      PT LONG TERM GOAL #3   Title  Pt will demonstrate at least 50 degrees of cervical rotation  bilaterally for ability to back out of parking spaces    Baseline  Lt 30 deg; Rt 50 deg    Time  8    Period  Weeks    Status  New    Target Date  12/08/18      PT LONG TERM GOAL #4   Title  Pt will demonstrate improved UE strength to MMT 5/5 shoulder flexion and abduction bilaterally for ability to do household chores.    Time  8    Period  Weeks    Status  New    Target Date  12/08/18      PT LONG TERM GOAL #5   Title  pt will be ind with advanced HEP    Time  8    Period  Weeks    Status  New    Target Date  12/08/18            Plan - 10/20/18 0855    Clinical Impression Statement  Pt has been working on [employer] throughout the day. She still has issues with pain at night, able to sleep for no more than 2 hours at a time. Completed manual treatment to cervical spine and surrounding musculature in order to improve mobility and decrease proximal neural tension. She had some difficulty with neck retraction, requiring some therapist cues initially to decrease pain. Pt was educated on sleeping posture in order to decrease nerve tension  and pain and she verbalized understanding of this. Will continue with current POC.    Personal Factors and Comorbidities  Profession;Comorbidity 1    Comorbidities  chronic pain    Examination-Activity Limitations  Reach Overhead;Carry;Sleep;Other    Stability/Clinical Decision Making  Evolving/Moderate complexity    Rehab Potential  Excellent    PT Frequency  2x / week    PT Duration  8 weeks    PT Treatment/Interventions  ADLs/Self Care Home Management;Cryotherapy;Electrical Stimulation;Moist Heat;Traction;Therapeutic activities;Therapeutic exercise;Neuromuscular re-education;Manual techniques;Patient/family education;Passive range of motion;Dry needling;Taping    PT Next Visit Plan  dry needling as needed, cervical ROM and retractions, scapular strengthening, trial radial nerve flossing and provide for HEP    PT Home Exercise Plan  Access Code: [email or web address]    Consulted and Agree with Plan of Care  Patient       Patient will benefit from skilled therapeutic intervention in order to improve the following deficits and impairments:  Pain, Increased fascial restricitons, Increased muscle spasms, Postural dysfunction, Decreased range of motion, Decreased strength  Visit Diagnosis: 1. Cervicalgia   2. Muscle weakness (generalized)   3. Pain in left upper arm   4. Abnormal posture        Problem List Patient Active Problem List   Diagnosis Date Noted  . Genetic testing 12/06/2017  . Melanoma of skin ([place]) 11/11/2017  . Family history of prostate cancer   . Family history of breast cancer   . Family history of melanoma   . Family history of lung cancer   . Family history of throat cancer   . Adenocarcinoma of endometrium ([place]) 08/23/2017    9:55 AM,10/20/18 Sherol Dade PT, DPT [facility] [address], [place], [place], [address] Phone: [phone]    Fax:  [phone]  Name: Tina Ray MRN: [id number] Date of Birth: 12-10-1970

## 2018-10-23 ENCOUNTER — Ambulatory Visit: Payer: Federal, State, Local not specified - PPO | Admitting: Physical Therapy

## 2018-10-23 ENCOUNTER — Encounter: Payer: Self-pay | Admitting: Physical Therapy

## 2018-10-23 ENCOUNTER — Other Ambulatory Visit: Payer: Self-pay

## 2018-10-23 DIAGNOSIS — R293 Abnormal posture: Secondary | ICD-10-CM

## 2018-10-23 DIAGNOSIS — M6281 Muscle weakness (generalized): Secondary | ICD-10-CM

## 2018-10-23 DIAGNOSIS — M79622 Pain in left upper arm: Secondary | ICD-10-CM | POA: Diagnosis not present

## 2018-10-23 DIAGNOSIS — M542 Cervicalgia: Secondary | ICD-10-CM

## 2018-10-23 NOTE — Therapy (Signed)
[facility] [address], [place], [place], [address] Phone: [phone]   Fax:  [phone]  Physical Therapy Treatment  Patient Details  Name: Tina Ray MRN: [id number] Date of Birth: 07-Feb-1971 Referring Provider (PT): Orpah Melter, MD   Encounter Date: 10/23/2018  PT End of Session - 10/23/18 0908    Visit Number  4    Date for PT Re-Evaluation  12/08/18    Authorization Type  BCBS    PT Start Time  0907    PT Stop Time  0956    PT Time Calculation (min)  49 min    Activity Tolerance  Patient tolerated treatment well    Behavior During Therapy  [facility] for tasks assessed/performed       Past Medical History:  Diagnosis Date  . Family history of breast cancer   . Family history of lung cancer   . Family history of melanoma   . Family history of prostate cancer   . Family history of throat cancer   . Hypertension   . IBS (irritable bowel syndrome)   . Melanoma of skin ([place]) 11/11/2017  . Peripheral vascular disease ([place])   . Skin cancer   . Skin cancer (melanoma) ([place])    1999    Past Surgical History:  Procedure Laterality Date  . BREAST BIOPSY Left 03/04/2016  . BREAST BIOPSY Left 03/06/2015  . DENTAL SURGERY     wisedom tooth extraction  . LYMPH NODE BIOPSY N/A 09/12/2017   Procedure: SENTINEL LYMPH NODE BIOPSY;  Surgeon: Everitt Amber, MD;  [facility];  Service: Gynecology;  Laterality: N/A;  . ROBOTIC ASSISTED TOTAL HYSTERECTOMY WITH BILATERAL SALPINGO OOPHERECTOMY Bilateral 09/12/2017   Procedure: XI ROBOTIC ASSISTED TOTAL LAPAROSCOPIC  HYSTERECTOMY WITH BILATERAL SALPINGO OOPHORECTOMY;  Surgeon: Everitt Amber, MD;  [facility];  Service: Gynecology;  Laterality: Bilateral;    There were no vitals filed for this visit.  Subjective Assessment - 10/23/18 0910    Subjective  The sleeping tips are helping. Friday and Saturday I got a little more sleep. I do have numbness along the  backside of my forearm today.    Diagnostic tests  x-ray showing severe narrowing of C6-7 disc space    Patient Stated Goals  get rid of pain, be able to back up with the car for getting out of a parking, be able to work without pain    Currently in Pain?  Yes    Pain Score  6     Pain Location  Arm   Upper LT arm   Pain Orientation  Left    Pain Descriptors / Indicators  Numbness;Dull    Aggravating Factors   At night    Pain Relieving Factors  Better during the day    Multiple Pain Sites  No                       [facility] Adult PT Treatment/Exercise - 10/23/18 0001      Neck Exercises: Supine   Neck Retraction  20 reps;5 secs   added red band scap unattached Bil 10x for second set     Shoulder Exercises: ROM/Strengthening   UBE (Upper Arm Bike)  L1 5 min reverse with lumbar support      Shoulder Exercises: Stretch   Other Shoulder Stretches  Lt radial nerve flossing. Added for HEP      Manual Therapy   Soft tissue mobilization  upper  trap/levator scap/scalenes             PT Education - 10/23/18 1002    Education Details  HEP    Person(s) Educated  Patient    Methods  Explanation;Demonstration;Verbal cues;Handout    Comprehension  Verbalized understanding;Returned demonstration       PT Short Term Goals - 10/23/18 0930      PT SHORT TERM GOAL #1   Title  pt will report centralizing of symptoms with no symptoms in Lt arm    Time  4    Period  Weeks    Status  On-going        PT Long Term Goals - 10/13/18 0934      PT LONG TERM GOAL #1   Title  pt will be able to roll easily in bed without increased pain    Time  8    Period  Weeks    Status  New    Target Date  12/08/18      PT LONG TERM GOAL #2   Title  Pt will report 70% less pain at night for improved sleep    Time  8    Period  Weeks    Status  New    Target Date  12/08/18      PT LONG TERM GOAL #3   Title  Pt will demonstrate at least 50 degrees of cervical rotation   bilaterally for ability to back out of parking spaces    Baseline  Lt 30 deg; Rt 50 deg    Time  8    Period  Weeks    Status  New    Target Date  12/08/18      PT LONG TERM GOAL #4   Title  Pt will demonstrate improved UE strength to MMT 5/5 shoulder flexion and abduction bilaterally for ability to do household chores.    Time  8    Period  Weeks    Status  New    Target Date  12/08/18      PT LONG TERM GOAL #5   Title  pt will be ind with advanced HEP    Time  8    Period  Weeks    Status  New    Target Date  12/08/18            Plan - 10/23/18 0908    Clinical Impression Statement  Pt presents with reported numbness along the posterior forearm left. She feels the sleepin gtips she has received has been helpful as she notices she is getting a few more hours. She was given supine scapular strength with red band and radial nerve flossing for addititional HEP. Her numbness was abolished by end of session in her forearm. Left upper trap quite thick and tender during manual work today.    Personal Factors and Comorbidities  Profession;Comorbidity 1    Comorbidities  chronic pain    Examination-Activity Limitations  Reach Overhead;Carry;Sleep;Other    Stability/Clinical Decision Making  Evolving/Moderate complexity    Rehab Potential  Excellent    PT Frequency  2x / week    PT Duration  8 weeks    PT Treatment/Interventions  ADLs/Self Care Home Management;Cryotherapy;Electrical Stimulation;Moist Heat;Traction;Therapeutic activities;Therapeutic exercise;Neuromuscular re-education;Manual techniques;Patient/family education;Passive range of motion;Dry needling;Taping    PT Next Visit Plan  Pt is interested in DN next session to her Lt upper trap. Review new HEP and conitnue with postural/scapular strength.    PT Home Exercise  Plan  Access Code: [email or web address]    Consulted and Agree with Plan of Care  Patient       Patient will benefit from skilled therapeutic intervention in order to  improve the following deficits and impairments:  Pain, Increased fascial restricitons, Increased muscle spasms, Postural dysfunction, Decreased range of motion, Decreased strength  Visit Diagnosis: 1. Cervicalgia   2. Muscle weakness (generalized)   3. Pain in left upper arm   4. Abnormal posture        Problem List Patient Active Problem List   Diagnosis Date Noted  . Genetic testing 12/06/2017  . Melanoma of skin ([place]) 11/11/2017  . Family history of prostate cancer   . Family history of breast cancer   . Family history of melanoma   . Family history of lung cancer   . Family history of throat cancer   . Adenocarcinoma of endometrium ([place]) 08/23/2017    Suzzette Gasparro, PTA 10/23/2018, 10:02 AM  [facility] [address], [place], [place], [address] Phone: [phone]   Fax:  [phone]  Name: EMMETT ARNTZ MRN: [id number] Date of Birth: 05/28/1970

## 2018-10-27 ENCOUNTER — Other Ambulatory Visit: Payer: Self-pay

## 2018-10-27 ENCOUNTER — Ambulatory Visit: Payer: Federal, State, Local not specified - PPO | Admitting: Physical Therapy

## 2018-10-27 ENCOUNTER — Encounter: Payer: Self-pay | Admitting: Physical Therapy

## 2018-10-27 DIAGNOSIS — M6281 Muscle weakness (generalized): Secondary | ICD-10-CM

## 2018-10-27 DIAGNOSIS — M542 Cervicalgia: Secondary | ICD-10-CM

## 2018-10-27 DIAGNOSIS — M79622 Pain in left upper arm: Secondary | ICD-10-CM | POA: Diagnosis not present

## 2018-10-27 DIAGNOSIS — R293 Abnormal posture: Secondary | ICD-10-CM | POA: Diagnosis not present

## 2018-10-27 NOTE — Therapy (Signed)
[facility] [address], [place], [place], [address] Phone: [phone]   Fax:  [phone]  Physical Therapy Treatment  Patient Details  Name: Tina Ray MRN: [id number] Date of Birth: 1970/09/21 Referring Provider (PT): Orpah Melter, MD   Encounter Date: 10/27/2018  PT End of Session - 10/27/18 1322    Visit Number  5    Date for PT Re-Evaluation  12/08/18    Authorization Type  BCBS    PT Start Time  1238   dry needling for portion of session   PT Stop Time  1856    PT Time Calculation (min)  39 min    Activity Tolerance  Patient tolerated treatment well    Behavior During Therapy  [facility] for tasks assessed/performed       Past Medical History:  Diagnosis Date  . Family history of breast cancer   . Family history of lung cancer   . Family history of melanoma   . Family history of prostate cancer   . Family history of throat cancer   . Hypertension   . IBS (irritable bowel syndrome)   . Melanoma of skin ([place]) 11/11/2017  . Peripheral vascular disease ([place])   . Skin cancer   . Skin cancer (melanoma) ([place])    1999    Past Surgical History:  Procedure Laterality Date  . BREAST BIOPSY Left 03/04/2016  . BREAST BIOPSY Left 03/06/2015  . DENTAL SURGERY     wisedom tooth extraction  . LYMPH NODE BIOPSY N/A 09/12/2017   Procedure: SENTINEL LYMPH NODE BIOPSY;  Surgeon: Everitt Amber, MD;  Location: [facility];  Service: Gynecology;  Laterality: N/A;  . ROBOTIC ASSISTED TOTAL HYSTERECTOMY WITH BILATERAL SALPINGO OOPHERECTOMY Bilateral 09/12/2017   Procedure: XI ROBOTIC ASSISTED TOTAL LAPAROSCOPIC  HYSTERECTOMY WITH BILATERAL SALPINGO OOPHORECTOMY;  Surgeon: Everitt Amber, MD;  Location: [facility];  Service: Gynecology;  Laterality: Bilateral;    There were no vitals filed for this visit.  Subjective Assessment - 10/27/18 1240    Subjective  Pt states that she as having pain at night and her Lt thumb began to swell  and increase pain. She has been working on her HEP and does not notice any increase in pain.    Diagnostic tests  x-ray showing severe narrowing of C6-7 disc space    Patient Stated Goals  get rid of pain, be able to back up with the car for getting out of a parking, be able to work without pain    Currently in Pain?  Yes    Pain Score  5     Pain Location  Other (Comment)   Lt upper trap   Pain Descriptors / Indicators  Aching    Pain Type  Chronic pain    Pain Radiating Towards  down the arm and into the Lt thumb    Pain Onset  More than a month ago    Pain Frequency  Intermittent                       [facility] Adult PT Treatment/Exercise - 10/27/18 0001      Shoulder Exercises: Stretch   Other Shoulder Stretches  Lt radial nerve floss x10 reps with wrist flexion at side and head tilts, x10 reps wrist flexion at side with shoulder depression/elevation       Manual Therapy   Joint Mobilization  Grade III CPAs C7, T1, T2    Soft tissue  mobilization  STM Lt upper/middle trap, cervical paraspinals       Trigger Point Dry Needling - 10/27/18 0001    Consent Given?  Yes    Education Handout Provided  Previously provided    Muscles Treated Head and Neck  Upper trapezius;Cervical multifidi    Upper Trapezius Response  Twitch reponse elicited;Palpable increased muscle length   Lt    Cervical multifidi Response  Twitch reponse elicited;Palpable increased muscle length   Lt T1, C7          PT Education - 10/27/18 1319    Education Details  reviewed HEP radial nerve glide; dry needling aftercare    Person(s) Educated  Patient    Methods  Explanation;Verbal cues    Comprehension  Verbalized understanding;Returned demonstration       PT Short Term Goals - 10/23/18 0930      PT SHORT TERM GOAL #1   Title  pt will report centralizing of symptoms with no symptoms in Lt arm    Time  4    Period  Weeks    Status  On-going        PT Long Term Goals - 10/13/18  0934      PT LONG TERM GOAL #1   Title  pt will be able to roll easily in bed without increased pain    Time  8    Period  Weeks    Status  New    Target Date  12/08/18      PT LONG TERM GOAL #2   Title  Pt will report 70% less pain at night for improved sleep    Time  8    Period  Weeks    Status  New    Target Date  12/08/18      PT LONG TERM GOAL #3   Title  Pt will demonstrate at least 50 degrees of cervical rotation  bilaterally for ability to back out of parking spaces    Baseline  Lt 30 deg; Rt 50 deg    Time  8    Period  Weeks    Status  New    Target Date  12/08/18      PT LONG TERM GOAL #4   Title  Pt will demonstrate improved UE strength to MMT 5/5 shoulder flexion and abduction bilaterally for ability to do household chores.    Time  8    Period  Weeks    Status  New    Target Date  12/08/18      PT LONG TERM GOAL #5   Title  pt will be ind with advanced HEP    Time  8    Period  Weeks    Status  New    Target Date  12/08/18            Plan - 10/27/18 1324    Clinical Impression Statement  Pt arrived with continued reports of Lt UE tingling/numbness over the past week. Pt noted increase in Lt thumb pain over the past 24 hours. Therapist reviewed new radial nerve flossing activity, and pt did require some adjustments with this to decrease nerve irritation. Pt was agreeable to dry needling treatment and there were several twitch responses noted. Ended without reports of increased neck and shoulder pain, and there was notable decrease in tightness of the Lt upper trap. Therapist had pt hold off on nerve flossing at home to assess nerve irritation at her next session.  She was agreeable to this.    Personal Factors and Comorbidities  Profession;Comorbidity 1    Comorbidities  chronic pain    Examination-Activity Limitations  Reach Overhead;Carry;Sleep;Other    Stability/Clinical Decision Making  Evolving/Moderate complexity    Rehab Potential  Excellent     PT Frequency  2x / week    PT Duration  8 weeks    PT Treatment/Interventions  ADLs/Self Care Home Management;Cryotherapy;Electrical Stimulation;Moist Heat;Traction;Therapeutic activities;Therapeutic exercise;Neuromuscular re-education;Manual techniques;Patient/family education;Passive range of motion;Dry needling;Taping    PT Next Visit Plan  f/u on DN; passive nerve flossing; trial cx mobs to mid/upper c-spine; continue with postural/scapular strength;    PT Home Exercise Plan  Access Code: [email or web address]    Consulted and Agree with Plan of Care  Patient       Patient will benefit from skilled therapeutic intervention in order to improve the following deficits and impairments:  Pain, Increased fascial restricitons, Increased muscle spasms, Postural dysfunction, Decreased range of motion, Decreased strength  Visit Diagnosis: 1. Cervicalgia   2. Muscle weakness (generalized)   3. Pain in left upper arm   4. Abnormal posture        Problem List Patient Active Problem List   Diagnosis Date Noted  . Genetic testing 12/06/2017  . Melanoma of skin ([place]) 11/11/2017  . Family history of prostate cancer   . Family history of breast cancer   . Family history of melanoma   . Family history of lung cancer   . Family history of throat cancer   . Adenocarcinoma of endometrium ([place]) 08/23/2017    1:30 PM,10/27/18 Sherol Dade PT, DPT [facility] [address] [place], [place], [address] Phone: [phone]   Fax:  [phone]  Name: Tina Ray MRN: [id number] Date of Birth: 1971-03-23

## 2018-10-31 ENCOUNTER — Encounter: Payer: Self-pay | Admitting: Physical Therapy

## 2018-10-31 ENCOUNTER — Ambulatory Visit: Payer: Federal, State, Local not specified - PPO | Admitting: Physical Therapy

## 2018-10-31 ENCOUNTER — Other Ambulatory Visit: Payer: Self-pay

## 2018-10-31 DIAGNOSIS — R293 Abnormal posture: Secondary | ICD-10-CM | POA: Diagnosis not present

## 2018-10-31 DIAGNOSIS — M79622 Pain in left upper arm: Secondary | ICD-10-CM | POA: Diagnosis not present

## 2018-10-31 DIAGNOSIS — M542 Cervicalgia: Secondary | ICD-10-CM

## 2018-10-31 DIAGNOSIS — M6281 Muscle weakness (generalized): Secondary | ICD-10-CM | POA: Diagnosis not present

## 2018-10-31 NOTE — Therapy (Signed)
[facility] [address] [place], [place], [address] Phone: [phone]   Fax:  [phone]  Physical Therapy Treatment  Patient Details  Name: Tina Ray MRN: [id number] Date of Birth: [birth year] Referring Provider (PT): Orpah Melter, MD   Encounter Date: 10/31/2018  PT End of Session - 10/31/18 0829    Visit Number  6    Date for PT Re-Evaluation  12/08/18    Authorization Type  BCBS    PT Start Time  0808    PT Stop Time  0850    PT Time Calculation (min)  42 min    Activity Tolerance  Patient tolerated treatment well    Behavior During Therapy  [facility] for tasks assessed/performed       Past Medical History:  Diagnosis Date  . Family history of breast cancer   . Family history of lung cancer   . Family history of melanoma   . Family history of prostate cancer   . Family history of throat cancer   . Hypertension   . IBS (irritable bowel syndrome)   . Melanoma of skin ([place]) 11/11/2017  . Peripheral vascular disease ([place])   . Skin cancer   . Skin cancer (melanoma) ([place])    1999    Past Surgical History:  Procedure Laterality Date  . BREAST BIOPSY Left 03/04/2016  . BREAST BIOPSY Left 03/06/2015  . DENTAL SURGERY     wisedom tooth extraction  . LYMPH NODE BIOPSY N/A 09/12/2017   Procedure: SENTINEL LYMPH NODE BIOPSY;  Surgeon: Everitt Amber, MD;  [facility];  Service: Gynecology;  Laterality: N/A;  . ROBOTIC ASSISTED TOTAL HYSTERECTOMY WITH BILATERAL SALPINGO OOPHERECTOMY Bilateral 09/12/2017   Procedure: XI ROBOTIC ASSISTED TOTAL LAPAROSCOPIC  HYSTERECTOMY WITH BILATERAL SALPINGO OOPHORECTOMY;  Surgeon: Everitt Amber, MD;  [facility];  Service: Gynecology;  Laterality: Bilateral;    There were no vitals filed for this visit.  Subjective Assessment - 10/31/18 0809    Subjective  Pt states that things are doing better. She was sore after her last session but no actual pain.    Diagnostic  tests  x-ray showing severe narrowing of C6-7 disc space    Patient Stated Goals  get rid of pain, be able to back up with the car for getting out of a parking, be able to work without pain    Currently in Pain?  Yes    Pain Score  3     Pain Location  Shoulder    Pain Orientation  Left    Pain Descriptors / Indicators  Aching    Pain Radiating Towards  over top of the shoulder    Pain Onset  More than a month ago    Pain Frequency  Constant    Aggravating Factors   turning the head, worse to the Lt         [facility] PT Assessment - 10/31/18 0001      AROM   Cervical - Right Rotation  55    Cervical - Left Rotation  50                   [facility] Adult PT Treatment/Exercise - 10/31/18 0001      Shoulder Exercises: Supine   Horizontal ABduction  Strengthening;10 reps    Theraband Level (Shoulder Horizontal ABduction)  Level 3 (Green)    Diagonals  Strengthening;Both;10 reps    Theraband Level (Shoulder Diagonals)  Level 3 (Green)  Diagonals Limitations  verbal cues to avoid locking elbow       Shoulder Exercises: Seated   Other Seated Exercises  cervical retraction x15 reps       Shoulder Exercises: ROM/Strengthening   Other ROM/Strengthening Exercises  rows blue TB x15 reps      Shoulder Exercises: Stretch   Other Shoulder Stretches  snow angel over foam roll x10 reps       Manual Therapy   Joint Mobilization  Lt cervical rotation mobilization with movement C4-C6 x10 reps each              PT Education - 10/31/18 0852    Education Details  updated and reviewed HEP; progress towards goals    Person(s) Educated  Patient    Methods  Explanation;Handout;Verbal cues    Comprehension  Verbalized understanding;Returned demonstration       PT Short Term Goals - 10/31/18 0856      PT SHORT TERM GOAL #1   Title  pt will report centralizing of symptoms with no symptoms in Lt arm    Time  4    Period  Weeks    Status  Achieved    Target Date  11/10/18       PT SHORT TERM GOAL #2   Title  Pt will report 30% less pain for improved sleep    Time  4    Period  Weeks    Status  New    Target Date  11/10/18        PT Long Term Goals - 10/31/18 0857      PT LONG TERM GOAL #1   Title  pt will be able to roll easily in bed without increased pain    Time  8    Period  Weeks    Status  New      PT LONG TERM GOAL #2   Title  Pt will report 70% less pain at night for improved sleep    Time  8    Period  Weeks    Status  New      PT LONG TERM GOAL #3   Title  Pt will demonstrate at least 50 degrees of cervical rotation  bilaterally for ability to back out of parking spaces    Baseline  Lt 50 deg; Rt 55 deg    Time  8    Period  Weeks    Status  Achieved      PT LONG TERM GOAL #4   Title  Pt will demonstrate improved UE strength to MMT 5/5 shoulder flexion and abduction bilaterally for ability to do household chores.    Time  8    Period  Weeks    Status  New      PT LONG TERM GOAL #5   Title  pt will be ind with advanced HEP    Time  8    Period  Weeks    Status  New            Plan - 10/31/18 0947    Clinical Impression Statement  Pt is making steady progress towards her goals. She demonstrates 20 deg improvement in Lt cervical active rotation and notes no pain with this while checking her blind spots when driving.  Pt's HEP was updated to further promote middle trap and posterior shoulder strength and she was able to complete exercises progressions without noted shoulder shrug. She did have increased difficulty with seated neck  retraction, requiring verbal cues for technique correction initially. There was noted Lt shoulder shrug with this as well, but pt denied any increase in pain and peripheralization of her symptoms end of session. Will continue with current POC.    Personal Factors and Comorbidities  Profession;Comorbidity 1    Comorbidities  chronic pain    Examination-Activity Limitations  Reach  Overhead;Carry;Sleep;Other    Stability/Clinical Decision Making  Evolving/Moderate complexity    Rehab Potential  Excellent    PT Frequency  2x / week    PT Duration  8 weeks    PT Treatment/Interventions  ADLs/Self Care Home Management;Cryotherapy;Electrical Stimulation;Moist Heat;Traction;Therapeutic activities;Therapeutic exercise;Neuromuscular re-education;Manual techniques;Patient/family education;Passive range of motion;Dry needling;Taping    PT Next Visit Plan  cervical mobs as needed, T-spine mobility; continue with postural/scapular strength progressions; scalene stretch and mobilization    PT Home Exercise Plan  Access Code: [email or web address]    Consulted and Agree with Plan of Care  Patient       Patient will benefit from skilled therapeutic intervention in order to improve the following deficits and impairments:  Pain, Increased fascial restricitons, Increased muscle spasms, Postural dysfunction, Decreased range of motion, Decreased strength  Visit Diagnosis: 1. Cervicalgia   2. Muscle weakness (generalized)   3. Pain in left upper arm   4. Abnormal posture        Problem List Patient Active Problem List   Diagnosis Date Noted  . Genetic testing 12/06/2017  . Melanoma of skin ([place]) 11/11/2017  . Family history of prostate cancer   . Family history of breast cancer   . Family history of melanoma   . Family history of lung cancer   . Family history of throat cancer   . Adenocarcinoma of endometrium ([place]) 08/23/2017    9:48 AM,10/31/18 Sherol Dade PT, DPT [facility] [address] [place], [place], [address] Phone: [phone]   Fax:  [phone]  Name: PEACE NOYES MRN: [id number] Date of Birth: 07-20-1970

## 2018-10-31 NOTE — Patient Instructions (Signed)
Access Code: [email or web address]  URL: [email or web address]  Date: 10/31/2018  Prepared by: Sherol Dade   Exercises  Correct Seated Posture - 10 reps - 3 sets - 1x daily - 7x weekly  Doorway Pec Stretch at 90 Degrees Abduction - 3 reps - 1 sets - 30 sec hold - 1x daily - 7x weekly  Supine Shoulder Horizontal Abduction with Resistance - 10 reps - 2 sets - 1x daily - 7x weekly  Standing Shoulder Diagonal Horizontal Abduction 60/120 Degrees with Resistance - 10 reps - 2 sets - 1x daily - 7x weekly  Standing Cervical Retraction - 10 reps - 2 sets - 3 hold - 1x daily - 7x weekly  Squatting Shoulder Row with Anchored Resistance - 10 reps - 3 sets - 1x daily - 7x weekly    [facility] [address] [place],  [address] Phone # [phone] Fax [phone]

## 2018-11-02 ENCOUNTER — Encounter: Payer: Self-pay | Admitting: Physical Therapy

## 2018-11-02 ENCOUNTER — Other Ambulatory Visit: Payer: Self-pay

## 2018-11-02 ENCOUNTER — Ambulatory Visit: Payer: Federal, State, Local not specified - PPO | Admitting: Physical Therapy

## 2018-11-02 DIAGNOSIS — M6281 Muscle weakness (generalized): Secondary | ICD-10-CM | POA: Diagnosis not present

## 2018-11-02 DIAGNOSIS — M79622 Pain in left upper arm: Secondary | ICD-10-CM

## 2018-11-02 DIAGNOSIS — R293 Abnormal posture: Secondary | ICD-10-CM

## 2018-11-02 DIAGNOSIS — M542 Cervicalgia: Secondary | ICD-10-CM

## 2018-11-02 NOTE — Therapy (Signed)
[facility] [address] [place], [place], [address] Phone: [phone]   Fax:  [phone]  Physical Therapy Treatment  Patient Details  Name: Tina Ray MRN: [id number] Date of Birth: 10-25-1970 Referring Provider (PT): Orpah Melter, MD   Encounter Date: 11/02/2018  PT End of Session - 11/02/18 0806    Visit Number  7    Date for PT Re-Evaluation  12/08/18    Authorization Type  BCBS    PT Start Time  0806    PT Stop Time  0850    PT Time Calculation (min)  44 min    Activity Tolerance  Patient tolerated treatment well    Behavior During Therapy  [facility] for tasks assessed/performed       Past Medical History:  Diagnosis Date  . Family history of breast cancer   . Family history of lung cancer   . Family history of melanoma   . Family history of prostate cancer   . Family history of throat cancer   . Hypertension   . IBS (irritable bowel syndrome)   . Melanoma of skin ([place]) 11/11/2017  . Peripheral vascular disease ([place])   . Skin cancer   . Skin cancer (melanoma) ([place])    1999    Past Surgical History:  Procedure Laterality Date  . BREAST BIOPSY Left 03/04/2016  . BREAST BIOPSY Left 03/06/2015  . DENTAL SURGERY     wisedom tooth extraction  . LYMPH NODE BIOPSY N/A 09/12/2017   Procedure: SENTINEL LYMPH NODE BIOPSY;  Surgeon: Everitt Amber, MD;  [facility];  Service: Gynecology;  Laterality: N/A;  . ROBOTIC ASSISTED TOTAL HYSTERECTOMY WITH BILATERAL SALPINGO OOPHERECTOMY Bilateral 09/12/2017   Procedure: XI ROBOTIC ASSISTED TOTAL LAPAROSCOPIC  HYSTERECTOMY WITH BILATERAL SALPINGO OOPHORECTOMY;  Surgeon: Everitt Amber, MD;  [facility];  Service: Gynecology;  Laterality: Bilateral;    There were no vitals filed for this visit.  Subjective Assessment - 11/02/18 0808    Subjective  I think my improvements are sticking a litle bit, like turning my neck. I do better when I sleep better.    Currently in Pain?  Yes    Pain Score  3     Pain Location  Neck    Pain Orientation  Left    Pain Descriptors / Indicators  Tightness    Aggravating Factors   When I don't sleep well    Pain Relieving Factors  Moving, stretching    Multiple Pain Sites  No                       [facility] Adult PT Treatment/Exercise - 11/02/18 0001      Shoulder Exercises: Supine   Horizontal ABduction  Strengthening;Both;20 reps;Theraband    Theraband Level (Shoulder Horizontal ABduction)  Level 3 (Green)   VC for eccentric control   Diagonals  Strengthening;15 reps;Theraband    Theraband Level (Shoulder Diagonals)  Level 3 (Green)      Shoulder Exercises: ROM/Strengthening   UBE (Upper Arm Bike)  L2 2x2 with concurrent discussion of status      Shoulder Exercises: Stretch   Other Shoulder Stretches  Scissors, angels, protr/retrac over foam roll 10x each    TC for corrected protrac/retract   Other Shoulder Stretches  Cervical release series on foam roll                PT Short Term Goals - 11/02/18 9470  PT SHORT TERM GOAL #2   Title  Pt will report 30% less pain for improved sleep    Time  4    Period  Weeks    Status  On-going   Pt not waking with Lt shoulder radiating pain. 20%       PT Long Term Goals - 10/31/18 0857      PT LONG TERM GOAL #1   Title  pt will be able to roll easily in bed without increased pain    Time  8    Period  Weeks    Status  New      PT LONG TERM GOAL #2   Title  Pt will report 70% less pain at night for improved sleep    Time  8    Period  Weeks    Status  New      PT LONG TERM GOAL #3   Title  Pt will demonstrate at least 50 degrees of cervical rotation  bilaterally for ability to back out of parking spaces    Baseline  Lt 50 deg; Rt 55 deg    Time  8    Period  Weeks    Status  Achieved      PT LONG TERM GOAL #4   Title  Pt will demonstrate improved UE strength to MMT 5/5 shoulder flexion and abduction bilaterally  for ability to do household chores.    Time  8    Period  Weeks    Status  New      PT LONG TERM GOAL #5   Title  pt will be ind with advanced HEP    Time  8    Period  Weeks    Status  New            Plan - 11/02/18 0806    Clinical Impression Statement  Pt arrives with very little pain this AM. She notes when she gets a good night of sleep she feels better. Utilized the foam roll for self mobilizing effects for cervical and thoracic spine.Introduced cervical release techniques using the foam roll as pt has experessed interest in purchasing one for home.    Personal Factors and Comorbidities  Profession;Comorbidity 1    Comorbidities  chronic pain    Examination-Activity Limitations  Reach Overhead;Carry;Sleep;Other    Stability/Clinical Decision Making  Evolving/Moderate complexity    Rehab Potential  Excellent    PT Frequency  2x / week    PT Duration  8 weeks    PT Treatment/Interventions  ADLs/Self Care Home Management;Cryotherapy;Electrical Stimulation;Moist Heat;Traction;Therapeutic activities;Therapeutic exercise;Neuromuscular re-education;Manual techniques;Patient/family education;Passive range of motion;Dry needling;Taping    PT Next Visit Plan  Review foam roll routine, see if pt got one for home. Pt on vacation next week so probably take ROM    PT Home Exercise Plan  Access Code: [email or web address]    Consulted and Agree with Plan of Care  Patient       Patient will benefit from skilled therapeutic intervention in order to improve the following deficits and impairments:  Pain, Increased fascial restricitons, Increased muscle spasms, Postural dysfunction, Decreased range of motion, Decreased strength  Visit Diagnosis: 1. Muscle weakness (generalized)   2. Cervicalgia   3. Pain in left upper arm   4. Abnormal posture        Problem List Patient Active Problem List   Diagnosis Date Noted  . Genetic testing 12/06/2017  . Melanoma of skin ([place]) 11/11/2017  . Family  history of prostate cancer   . Family history of breast cancer   . Family history of melanoma   . Family history of lung cancer   . Family history of throat cancer   . Adenocarcinoma of endometrium ([place]) 08/23/2017    COCHRAN,JENNIFER, PTA 11/02/2018, 8:56 AM  [facility] [address] [place], [place], [address] Phone: [phone]   Fax:  [phone]  Name: Tina Ray MRN: [id number] Date of Birth: 28-Jul-1970

## 2018-11-02 NOTE — Patient Instructions (Signed)
The MELT METHOD by SUE HITZMAN  Pro roller soft by OPTP

## 2018-11-13 ENCOUNTER — Ambulatory Visit: Payer: Federal, State, Local not specified - PPO | Attending: Family Medicine | Admitting: Physical Therapy

## 2018-11-13 ENCOUNTER — Encounter: Payer: Self-pay | Admitting: Physical Therapy

## 2018-11-13 ENCOUNTER — Other Ambulatory Visit: Payer: Self-pay

## 2018-11-13 DIAGNOSIS — R293 Abnormal posture: Secondary | ICD-10-CM

## 2018-11-13 DIAGNOSIS — M542 Cervicalgia: Secondary | ICD-10-CM

## 2018-11-13 DIAGNOSIS — M6281 Muscle weakness (generalized): Secondary | ICD-10-CM

## 2018-11-13 DIAGNOSIS — M79622 Pain in left upper arm: Secondary | ICD-10-CM | POA: Diagnosis not present

## 2018-11-13 NOTE — Therapy (Signed)
[facility] [address] [place], [place], [address] Phone: [phone]   Fax:  [phone]  Physical Therapy Treatment  Patient Details  Name: Tina Ray MRN: [id number] Date of Birth: 02-27-1971 Referring Provider (PT): Orpah Melter, MD   Encounter Date: 11/13/2018  PT End of Session - 11/13/18 0934    Visit Number  8    Date for PT Re-Evaluation  12/08/18    Authorization Type  BCBS    PT Start Time  0934    PT Stop Time  1013    PT Time Calculation (min)  39 min    Activity Tolerance  Patient tolerated treatment well    Behavior During Therapy  [facility] for tasks assessed/performed       Past Medical History:  Diagnosis Date  . Family history of breast cancer   . Family history of lung cancer   . Family history of melanoma   . Family history of prostate cancer   . Family history of throat cancer   . Hypertension   . IBS (irritable bowel syndrome)   . Melanoma of skin ([place]) 11/11/2017  . Peripheral vascular disease ([place])   . Skin cancer   . Skin cancer (melanoma) ([place])    1999    Past Surgical History:  Procedure Laterality Date  . BREAST BIOPSY Left 03/04/2016  . BREAST BIOPSY Left 03/06/2015  . DENTAL SURGERY     wisedom tooth extraction  . LYMPH NODE BIOPSY N/A 09/12/2017   Procedure: SENTINEL LYMPH NODE BIOPSY;  Surgeon: Everitt Amber, MD;  Location: [facility];  Service: Gynecology;  Laterality: N/A;  . ROBOTIC ASSISTED TOTAL HYSTERECTOMY WITH BILATERAL SALPINGO OOPHERECTOMY Bilateral 09/12/2017   Procedure: XI ROBOTIC ASSISTED TOTAL LAPAROSCOPIC  HYSTERECTOMY WITH BILATERAL SALPINGO OOPHORECTOMY;  Surgeon: Everitt Amber, MD;  Location: [facility];  Service: Gynecology;  Laterality: Bilateral;    There were no vitals filed for this visit.  Subjective Assessment - 11/13/18 0936    Subjective  Had a great week. My pain has been 2-3/10 level all week.    Currently in Pain?  Yes    Pain Score  2     Pain  Location  Neck    Pain Onset  More than a month ago    Multiple Pain Sites  No                       [facility] Adult PT Treatment/Exercise - 11/13/18 0001      Shoulder Exercises: Supine   Horizontal ABduction  Strengthening;Both;20 reps;Theraband   on foam roll   Theraband Level (Shoulder Horizontal ABduction)  Level 3 (Green)   VC for eccentric control   Diagonals  Strengthening;15 reps;Theraband    Theraband Level (Shoulder Diagonals)  Level 3 (Green)   on foam roll    Other Supine Exercises  2# protraction/retraction on roll 2x10      Shoulder Exercises: Seated   Flexion  Strengthening;Both;10 reps;Weights    Flexion Weight (lbs)  2   Sitting on green ball for core contraction   Abduction  Strengthening;Both;10 reps;Weights    ABduction Weight (lbs)  1   sitting on green ball     Shoulder Exercises: ROM/Strengthening   UBE (Upper Arm Bike)  L2 3x3 discussion of current status      Shoulder Exercises: Stretch   Other Shoulder Stretches  Scissors, angels, protr/retrac over foam roll 10x each    TC for corrected protrac/retract  Neck Exercises: Stretches   Upper Trapezius Stretch  Right;Left;2 reps;20 seconds               PT Short Term Goals - 11/13/18 0943      PT SHORT TERM GOAL #2   Title  Pt will report 30% less pain for improved sleep    Time  4    Period  Weeks    Status  Achieved   50%       PT Long Term Goals - 10/31/18 0857      PT LONG TERM GOAL #1   Title  pt will be able to roll easily in bed without increased pain    Time  8    Period  Weeks    Status  New      PT LONG TERM GOAL #2   Title  Pt will report 70% less pain at night for improved sleep    Time  8    Period  Weeks    Status  New      PT LONG TERM GOAL #3   Title  Pt will demonstrate at least 50 degrees of cervical rotation  bilaterally for ability to back out of parking spaces    Baseline  Lt 50 deg; Rt 55 deg    Time  8    Period  Weeks    Status   Achieved      PT LONG TERM GOAL #4   Title  Pt will demonstrate improved UE strength to MMT 5/5 shoulder flexion and abduction bilaterally for ability to do household chores.    Time  8    Period  Weeks    Status  New      PT LONG TERM GOAL #5   Title  pt will be ind with advanced HEP    Time  8    Period  Weeks    Status  New            Plan - 11/13/18 0934    Clinical Impression Statement  Pt arrives with very positive reports today regarding her pain. Over the last week her pain has intermittently 2-3/10 level. She struggles with bed mobility and not initiating rolling or "getting up" from her neck and using her core more. Radicular arm symptoms are decreased by 50%. Pt plans on purchasing a soft foam roll for home use buthas not done so yet. Today pt felt comfortable increasing the resisatnce on her band to increase the difficulty, no pain. Began shoulder PREs sitting on physioball to progress towards LTG of increasing shoulder strength.    Personal Factors and Comorbidities  Profession;Comorbidity 1    Comorbidities  chronic pain    Examination-Activity Limitations  Reach Overhead;Carry;Sleep;Other    Stability/Clinical Decision Making  Evolving/Moderate complexity    Rehab Potential  Excellent    PT Frequency  2x / week    PT Duration  8 weeks    PT Treatment/Interventions  ADLs/Self Care Home Management;Cryotherapy;Electrical Stimulation;Moist Heat;Traction;Therapeutic activities;Therapeutic exercise;Neuromuscular re-education;Manual techniques;Patient/family education;Passive range of motion;Dry needling;Taping    PT Next Visit Plan  Take ROM measurements, add shoulder PREs to HEP, pt thinks she has some weights at home and will check.    PT Home Exercise Plan  Access Code: [email or web address]    Consulted and Agree with Plan of Care  Patient       Patient will benefit from skilled therapeutic intervention in order to improve the following deficits and impairments:  Pain,  Increased  fascial restricitons, Increased muscle spasms, Postural dysfunction, Decreased range of motion, Decreased strength  Visit Diagnosis: 1. Muscle weakness (generalized)   2. Cervicalgia   3. Pain in left upper arm   4. Abnormal posture        Problem List Patient Active Problem List   Diagnosis Date Noted  . Genetic testing 12/06/2017  . Melanoma of skin ([place]) 11/11/2017  . Family history of prostate cancer   . Family history of breast cancer   . Family history of melanoma   . Family history of lung cancer   . Family history of throat cancer   . Adenocarcinoma of endometrium ([place]) 08/23/2017    Kellar Westberg, PTA 11/13/2018, 10:12 AM  [facility] [address] [place], [place], [address] Phone: [phone]   Fax:  [phone]  Name: HADLEY SOILEAU MRN: [id number] Date of Birth: July 11, 1970

## 2018-11-16 ENCOUNTER — Ambulatory Visit: Payer: Federal, State, Local not specified - PPO | Admitting: Physical Therapy

## 2018-11-16 ENCOUNTER — Encounter: Payer: Self-pay | Admitting: Physical Therapy

## 2018-11-16 ENCOUNTER — Other Ambulatory Visit: Payer: Self-pay

## 2018-11-16 DIAGNOSIS — R293 Abnormal posture: Secondary | ICD-10-CM | POA: Diagnosis not present

## 2018-11-16 DIAGNOSIS — M79622 Pain in left upper arm: Secondary | ICD-10-CM

## 2018-11-16 DIAGNOSIS — M542 Cervicalgia: Secondary | ICD-10-CM | POA: Diagnosis not present

## 2018-11-16 DIAGNOSIS — M6281 Muscle weakness (generalized): Secondary | ICD-10-CM

## 2018-11-16 NOTE — Patient Instructions (Signed)
Access Code: [email or web address]  URL: [email or web address]  Date: 11/16/2018  Prepared by: Sherol Dade   Exercises  Correct Seated Posture - 10 reps - 3 sets - 1x daily - 7x weekly  Doorway Pec Stretch at 90 Degrees Abduction - 3 reps - 1 sets - 30 sec hold - 1x daily - 7x weekly  Standing Shoulder Diagonal Horizontal Abduction 60/120 Degrees with Resistance - 10 reps - 2 sets - 1x daily - 7x weekly  Standing Isometric Cervical Retraction with Chin Tucks and Ball at [place] 15 reps - 1 sets - 3 hold - 2x daily - 7x weekly  Squatting Shoulder Row with Anchored Resistance - 10 reps - 3 sets - 1x daily - 7x weekly  Seated Shoulder Horizontal Abduction with Resistance - Thumbs Up - 15 reps - 2 sets - 1x daily - 7x weekly  Seated Shoulder Diagonal with Resistance - 15 reps - 2 sets - 1x daily - 7x weekly    [facility] [address] [place], [place] [address] Phone # [phone] Fax [phone]

## 2018-11-16 NOTE — Therapy (Signed)
[facility] [address], [place], [place], [address] Phone: [phone]   Fax:  [phone]  Physical Therapy Treatment  Patient Details  Name: Tina Ray MRN: [id number] Date of Birth: 08-15-1970 Referring Provider (PT): Orpah Melter, MD   Encounter Date: 11/16/2018  PT End of Session - 11/16/18 0916    Visit Number  9    Date for PT Re-Evaluation  12/08/18    Authorization Type  BCBS    PT Start Time  0823    PT Stop Time  0912    PT Time Calculation (min)  49 min    Activity Tolerance  Patient tolerated treatment well;No increased pain    Behavior During Therapy  [facility] for tasks assessed/performed       Past Medical History:  Diagnosis Date  . Family history of breast cancer   . Family history of lung cancer   . Family history of melanoma   . Family history of prostate cancer   . Family history of throat cancer   . Hypertension   . IBS (irritable bowel syndrome)   . Melanoma of skin ([place]) 11/11/2017  . Peripheral vascular disease ([place])   . Skin cancer   . Skin cancer (melanoma) ([place])    1999    Past Surgical History:  Procedure Laterality Date  . BREAST BIOPSY Left 03/04/2016  . BREAST BIOPSY Left 03/06/2015  . DENTAL SURGERY     wisedom tooth extraction  . LYMPH NODE BIOPSY N/A 09/12/2017   Procedure: SENTINEL LYMPH NODE BIOPSY;  Surgeon: Everitt Amber, MD;  [facility];  Service: Gynecology;  Laterality: N/A;  . ROBOTIC ASSISTED TOTAL HYSTERECTOMY WITH BILATERAL SALPINGO OOPHERECTOMY Bilateral 09/12/2017   Procedure: XI ROBOTIC ASSISTED TOTAL LAPAROSCOPIC  HYSTERECTOMY WITH BILATERAL SALPINGO OOPHORECTOMY;  Surgeon: Everitt Amber, MD;  [facility];  Service: Gynecology;  Laterality: Bilateral;    There were no vitals filed for this visit.  Subjective Assessment - 11/16/18 0825    Subjective  Pt states things are going well. She is having much less pain at night, as she is waking up but  able to go back to sleep. She has not had the burning and aching in her Lt arm for about 2 weeks.    Currently in Pain?  No/denies    Pain Onset  More than a month ago         [facility] PT Assessment - 11/16/18 0001      AROM   Cervical - Right Side Bend  20    Cervical - Left Side Bend  20   discomfort on Lt side   Cervical - Right Rotation  55    Cervical - Left Rotation  50                   [facility] Adult PT Treatment/Exercise - 11/16/18 0001      Self-Care   Posture  lumbar roll while seated, desk position and arm position while at work desk       Exercises   Exercises  Neck      Neck Exercises: Standing   Neck Retraction  10 reps    Neck Retraction Limitations  isometric with ball against wall     Other Standing Exercises  capital flexion isometric with ball against wall x12 reps       Neck Exercises: Seated   Neck Retraction  15 reps    Cervical Rotation  Left;10 reps  Cervical Rotation Limitations  with gentle overpressure stretch 5 sec hold       Shoulder Exercises: Seated   Horizontal ABduction  Strengthening;Both;15 reps    Theraband Level (Shoulder Horizontal ABduction)  Level 3 (Green)    Horizontal ABduction Limitations  focus on neck alignment     Diagonals  Strengthening;Both;15 reps    Theraband Level (Shoulder Diagonals)  Level 3 (Green)    Diagonals Limitations  Pt having difficulty with posterior shoulder activation, so decreased to yellow TB x10 reps with improved activation      Manual Therapy   Joint Mobilization  Lt cervical mobilization with movement at Lt C6, C7 x10 reps     Soft tissue mobilization  trigger point release Lt levator scap, trapezius with Rt lateral flexion x10 reps             PT Education - 11/16/18 0916    Education Details  updated HEP; technique with therex    Person(s) Educated  Patient    Methods  Explanation;Handout;Verbal cues    Comprehension  Verbalized understanding;Returned demonstration        PT Short Term Goals - 11/13/18 0943      PT SHORT TERM GOAL #2   Title  Pt will report 30% less pain for improved sleep    Time  4    Period  Weeks    Status  Achieved   50%       PT Long Term Goals - 11/16/18 0917      PT LONG TERM GOAL #1   Title  pt will be able to roll easily in bed without increased pain    Baseline  Pt has improved awareness but not consistent with this    Time  8    Period  Weeks    Status  On-going      PT LONG TERM GOAL #2   Title  Pt will report 70% less pain at night for improved sleep    Baseline  50%    Time  8    Period  Weeks    Status  Partially Met      PT LONG TERM GOAL #3   Title  Pt will demonstrate at least 50 degrees of cervical rotation  bilaterally for ability to back out of parking spaces    Baseline  Lt 50 deg; Rt 55 deg    Time  8    Period  Weeks    Status  Achieved      PT LONG TERM GOAL #4   Title  Pt will demonstrate improved UE strength to MMT 5/5 shoulder flexion and abduction bilaterally for ability to do household chores.    Time  8    Period  Weeks    Status  On-going      PT LONG TERM GOAL #5   Title  pt will be ind with advanced HEP    Time  8    Period  Weeks    Status  New            Plan - 11/16/18 8469    Clinical Impression Statement  Pt continues to report steady improvements in her cervical pain, noting 0/10 upon arrival today. She has done well with shoulder and cervical strength increases, so her HEP was updated to promote more upright positions with these exercises. Pt does struggle some with maintaining cervical alignment, noting forward head posture with shoulder resistance exercises. This was improved with tactile  and visual feedback. Therapist also discussed office desk setup in order to improve spine alignment with the use of a lumber roll. Pt liked this and feels she can easily incorporate this into her day. Ended without increase in cervical pain.    Personal Factors and Comorbidities   Profession;Comorbidity 1    Comorbidities  chronic pain    Examination-Activity Limitations  Reach Overhead;Carry;Sleep;Other    Stability/Clinical Decision Making  Evolving/Moderate complexity    Rehab Potential  Excellent    PT Frequency  2x / week    PT Duration  8 weeks    PT Treatment/Interventions  ADLs/Self Care Home Management;Cryotherapy;Electrical Stimulation;Moist Heat;Traction;Therapeutic activities;Therapeutic exercise;Neuromuscular re-education;Manual techniques;Patient/family education;Passive range of motion;Dry needling;Taping    PT Next Visit Plan  cervical rotation stretch with gentle overpressure; retraction with rotation; progress scap strength; thoracic extension/rotation mobility, pt supposed to check if she has weights at home    PT Home Exercise Plan  Access Code: [email or web address]    Consulted and Agree with Plan of Care  Patient       Patient will benefit from skilled therapeutic intervention in order to improve the following deficits and impairments:  Pain, Increased fascial restricitons, Increased muscle spasms, Postural dysfunction, Decreased range of motion, Decreased strength  Visit Diagnosis: 1. Muscle weakness (generalized)   2. Cervicalgia   3. Pain in left upper arm        Problem List Patient Active Problem List   Diagnosis Date Noted  . Genetic testing 12/06/2017  . Melanoma of skin ([place]) 11/11/2017  . Family history of prostate cancer   . Family history of breast cancer   . Family history of melanoma   . Family history of lung cancer   . Family history of throat cancer   . Adenocarcinoma of endometrium [facility]) 08/23/2017    9:27 AM,11/16/18 Sherol Dade PT, DPT [facility] [address], [place], [place], [address] Phone: [phone]   Fax:  [phone]  Name: DAWNMARIE BREON MRN: [id number] Date of Birth:  February 01, 1971

## 2018-11-20 ENCOUNTER — Ambulatory Visit: Payer: Federal, State, Local not specified - PPO | Admitting: Physical Therapy

## 2018-11-20 ENCOUNTER — Other Ambulatory Visit: Payer: Self-pay

## 2018-11-20 ENCOUNTER — Encounter: Payer: Self-pay | Admitting: Physical Therapy

## 2018-11-20 DIAGNOSIS — M79622 Pain in left upper arm: Secondary | ICD-10-CM

## 2018-11-20 DIAGNOSIS — M542 Cervicalgia: Secondary | ICD-10-CM

## 2018-11-20 DIAGNOSIS — R293 Abnormal posture: Secondary | ICD-10-CM | POA: Diagnosis not present

## 2018-11-20 DIAGNOSIS — M6281 Muscle weakness (generalized): Secondary | ICD-10-CM | POA: Diagnosis not present

## 2018-11-20 NOTE — Therapy (Signed)
[facility] [address] [place], [place], [address] Phone: [phone]   Fax:  [phone]  Physical Therapy Treatment  Patient Details  Name: Tina Ray MRN: [id number] Date of Birth: 1970/10/12 Referring Provider (PT): Orpah Melter, MD   Encounter Date: 11/20/2018  PT End of Session - 11/20/18 0813    Visit Number  10    Date for PT Re-Evaluation  12/08/18    Authorization Type  BCBS    PT Start Time  0806    PT Stop Time  0845    PT Time Calculation (min)  39 min    Activity Tolerance  Patient tolerated treatment well    Behavior During Therapy  [facility] for tasks assessed/performed       Past Medical History:  Diagnosis Date  . Family history of breast cancer   . Family history of lung cancer   . Family history of melanoma   . Family history of prostate cancer   . Family history of throat cancer   . Hypertension   . IBS (irritable bowel syndrome)   . Melanoma of skin ([place]) 11/11/2017  . Peripheral vascular disease ([place])   . Skin cancer   . Skin cancer (melanoma) ([place])    1999    Past Surgical History:  Procedure Laterality Date  . BREAST BIOPSY Left 03/04/2016  . BREAST BIOPSY Left 03/06/2015  . DENTAL SURGERY     wisedom tooth extraction  . LYMPH NODE BIOPSY N/A 09/12/2017   Procedure: SENTINEL LYMPH NODE BIOPSY;  Surgeon: Everitt Amber, MD;  [facility];  Service: Gynecology;  Laterality: N/A;  . ROBOTIC ASSISTED TOTAL HYSTERECTOMY WITH BILATERAL SALPINGO OOPHERECTOMY Bilateral 09/12/2017   Procedure: XI ROBOTIC ASSISTED TOTAL LAPAROSCOPIC  HYSTERECTOMY WITH BILATERAL SALPINGO OOPHORECTOMY;  Surgeon: Everitt Amber, MD;  [facility];  Service: Gynecology;  Laterality: Bilateral;    There were no vitals filed for this visit.  Subjective Assessment - 11/20/18 0816    Subjective  Progressing well, no pain this AM. Pt has to buy weights this week.    Currently in Pain?  No/denies    Multiple Pain Sites  No                       [facility] Adult PT Treatment/Exercise - 11/20/18 0001      Shoulder Exercises: Supine   Other Supine Exercises  3# protraction/retraction on roll 2x15      Shoulder Exercises: Seated   Horizontal ABduction  Strengthening;Both;15 reps;Theraband    Theraband Level (Shoulder Horizontal ABduction)  Level 1 (Yellow)   shortened band sitting on ball   Flexion  Strengthening;Both;10 reps;Weights    Flexion Weight (lbs)  3   sitting on ball, VC for cerv retraction   Flexion Limitations  [place] Bil 3# 10x on ball with cervical retraction    Abduction  Strengthening;Both;10 reps;Weights    ABduction Weight (lbs)  3   Sitting on ball with VC for retraction   Diagonals  Strengthening;Both;15 reps;Theraband    Theraband Level (Shoulder Diagonals)  Level 1 (Yellow)   Shortened yellow band, sitting on ball     Shoulder Exercises: Stretch   Other Shoulder Stretches  Scissors, angels, protr/retrac over foam roll 10x each    TC for corrected protrac/retract              PT Short Term Goals - 11/13/18 0943      PT SHORT TERM GOAL #2  Title  Pt will report 30% less pain for improved sleep    Time  4    Period  Weeks    Status  Achieved   50%       PT Long Term Goals - 11/16/18 0917      PT LONG TERM GOAL #1   Title  pt will be able to roll easily in bed without increased pain    Baseline  Pt has improved awareness but not consistent with this    Time  8    Period  Weeks    Status  On-going      PT LONG TERM GOAL #2   Title  Pt will report 70% less pain at night for improved sleep    Baseline  50%    Time  8    Period  Weeks    Status  Partially Met      PT LONG TERM GOAL #3   Title  Pt will demonstrate at least 50 degrees of cervical rotation  bilaterally for ability to back out of parking spaces    Baseline  Lt 50 deg; Rt 55 deg    Time  8    Period  Weeks    Status  Achieved      PT LONG TERM GOAL #4    Title  Pt will demonstrate improved UE strength to MMT 5/5 shoulder flexion and abduction bilaterally for ability to do household chores.    Time  8    Period  Weeks    Status  On-going      PT LONG TERM GOAL #5   Title  pt will be ind with advanced HEP    Time  8    Period  Weeks    Status  New            Plan - 11/20/18 0819    Clinical Impression Statement  Pt continues to sjow improvement, 0/10ntoday. We continue to work on refining cervical retraction with her seated exercises. She was able to sit on physioball with improved retraction ( cervical)  and lift heavier weights with her deltoids. No pain thoughou treatment.    Personal Factors and Comorbidities  Profession;Comorbidity 1    Comorbidities  chronic pain    Examination-Activity Limitations  Reach Overhead;Carry;Sleep;Other    Stability/Clinical Decision Making  Evolving/Moderate complexity    Rehab Potential  Excellent    PT Frequency  2x / week    PT Duration  8 weeks    PT Treatment/Interventions  ADLs/Self Care Home Management;Cryotherapy;Electrical Stimulation;Moist Heat;Traction;Therapeutic activities;Therapeutic exercise;Neuromuscular re-education;Manual techniques;Patient/family education;Passive range of motion;Dry needling;Taping    PT Next Visit Plan  cervical rotation stretch with gentle overpressure; retraction with rotation; progress scap strength; thoracic extension/rotation mobility.    PT Home Exercise Plan  Access Code: [email or web address]    Consulted and Agree with Plan of Care  Patient       Patient will benefit from skilled therapeutic intervention in order to improve the following deficits and impairments:  Pain, Increased fascial restricitons, Increased muscle spasms, Postural dysfunction, Decreased range of motion, Decreased strength  Visit Diagnosis: 1. Muscle weakness (generalized)   2. Cervicalgia   3. Pain in left upper arm   4. Abnormal posture        Problem List Patient Active Problem  List   Diagnosis Date Noted  . Genetic testing 12/06/2017  . Melanoma of skin ([place]) 11/11/2017  . Family history of prostate cancer   .  Family history of breast cancer   . Family history of melanoma   . Family history of lung cancer   . Family history of throat cancer   . Adenocarcinoma of endometrium ([place]) 08/23/2017    COCHRAN,JENNIFER, PTA 11/20/2018, 8:45 AM  [facility] [address] [place], [place], [address] Phone: [phone]   Fax:  [phone]  Name: Tina Ray MRN: [id number] Date of Birth: 06-Aug-1970

## 2018-11-22 ENCOUNTER — Ambulatory Visit: Payer: Federal, State, Local not specified - PPO

## 2018-11-23 ENCOUNTER — Encounter: Payer: Self-pay | Admitting: Physical Therapy

## 2018-11-23 ENCOUNTER — Ambulatory Visit: Payer: Federal, State, Local not specified - PPO | Admitting: Physical Therapy

## 2018-11-23 ENCOUNTER — Other Ambulatory Visit: Payer: Self-pay

## 2018-11-23 DIAGNOSIS — R293 Abnormal posture: Secondary | ICD-10-CM | POA: Diagnosis not present

## 2018-11-23 DIAGNOSIS — M6281 Muscle weakness (generalized): Secondary | ICD-10-CM | POA: Diagnosis not present

## 2018-11-23 DIAGNOSIS — M542 Cervicalgia: Secondary | ICD-10-CM | POA: Diagnosis not present

## 2018-11-23 DIAGNOSIS — M79622 Pain in left upper arm: Secondary | ICD-10-CM

## 2018-11-23 NOTE — Therapy (Signed)
[facility] [address] [place], [place], [address] Phone: [phone]   Fax:  [phone]  Physical Therapy Treatment  Patient Details  Name: Tina Ray MRN: [id number] Date of Birth: 1970-10-13 Referring Provider (PT): Orpah Melter, MD   Encounter Date: 11/23/2018  PT End of Session - 11/23/18 0831    Visit Number  11    Date for PT Re-Evaluation  12/08/18    Authorization Type  BCBS    PT Start Time  [phone]   pt arrived late to her appointment   PT Stop Time  0905    PT Time Calculation (min)  42 min    Activity Tolerance  Patient tolerated treatment well;No increased pain    Behavior During Therapy  WFL for tasks assessed/performed       Past Medical History:  Diagnosis Date  . Family history of breast cancer   . Family history of lung cancer   . Family history of melanoma   . Family history of prostate cancer   . Family history of throat cancer   . Hypertension   . IBS (irritable bowel syndrome)   . Melanoma of skin ([place]) 11/11/2017  . Peripheral vascular disease ([place])   . Skin cancer   . Skin cancer (melanoma) ([place])    1999    Past Surgical History:  Procedure Laterality Date  . BREAST BIOPSY Left 03/04/2016  . BREAST BIOPSY Left 03/06/2015  . DENTAL SURGERY     wisedom tooth extraction  . LYMPH NODE BIOPSY N/A 09/12/2017   Procedure: SENTINEL LYMPH NODE BIOPSY;  Surgeon: Everitt Amber, MD;  Location: [facility];  Service: Gynecology;  Laterality: N/A;  . ROBOTIC ASSISTED TOTAL HYSTERECTOMY WITH BILATERAL SALPINGO OOPHERECTOMY Bilateral 09/12/2017   Procedure: XI ROBOTIC ASSISTED TOTAL LAPAROSCOPIC  HYSTERECTOMY WITH BILATERAL SALPINGO OOPHORECTOMY;  Surgeon: Everitt Amber, MD;  Location: [facility];  Service: Gynecology;  Laterality: Bilateral;    There were no vitals filed for this visit.  Subjective Assessment - 11/23/18 0825    Subjective  Pt states that things are going great. She is not having  any issues with the Lt arm and neck at night. She feels atleast 75% improved with just a tiny amount of numbness over the forearm and discomfort with quickly turning her head to the Lt.    Currently in Pain?  --   soreness Rt anterior shoulder                      [facility] Adult PT Treatment/Exercise - 11/23/18 0001      Neck Exercises: Standing   Neck Retraction  10 reps;3 secs    Neck Retraction Limitations  ball press against wall    Other Standing Exercises  neck retraction with ball and cervical rotation Lt/Rt x10 reps; x5 reps with increased speed       Neck Exercises: Seated   Cervical Rotation  Both;10 reps    Cervical Rotation Limitations  towel assistance    Other Seated Exercise  low cervical rotation SNAG x10 reps Lt and Rt       Shoulder Exercises: Supine   Diagonals  Strengthening;Right;12 reps    Theraband Level (Shoulder Diagonals)  Level 1 (Yellow)    Diagonals Limitations  tactile cues for proper muscle activation, pain resolved with this       Shoulder Exercises: Sidelying   Other Sidelying Exercises  thoracic open book stretch x15 reps each direction  PT Short Term Goals - 11/13/18 0943      PT SHORT TERM GOAL #2   Title  Pt will report 30% less pain for improved sleep    Time  4    Period  Weeks    Status  Achieved   50%       PT Long Term Goals - 11/23/18 0916      PT LONG TERM GOAL #1   Title  pt will be able to roll easily in bed without increased pain    Baseline  Pt has improved awareness but not consistent with this    Time  8    Period  Weeks    Status  Achieved      PT LONG TERM GOAL #2   Title  Pt will report 70% less pain at night for improved sleep    Baseline  75%    Time  8    Period  Weeks    Status  Achieved      PT LONG TERM GOAL #3   Title  Pt will demonstrate at least 50 degrees of cervical rotation  bilaterally for ability to back out of parking spaces    Baseline  Lt 50 deg; Rt 55 deg     Time  8    Period  Weeks    Status  Achieved      PT LONG TERM GOAL #4   Title  Pt will demonstrate improved UE strength to MMT 5/5 shoulder flexion and abduction bilaterally for ability to do household chores.    Time  8    Period  Weeks    Status  On-going      PT LONG TERM GOAL #5   Title  pt will be ind with advanced HEP    Time  8    Period  Weeks    Status  New            Plan - 11/23/18 0905    Clinical Impression Statement  Pt feels her Lt UE symptoms are atleast 75% improved since starting PT. She still has issues with quick cervical rotation to the Lt and mild tingling in the Lt forearm. Pt notes Lt anterior shoulder pain with her diagonals and this was reviewed today. She has difficulty with scapula stabilization and posterior shoulder activation but this was improved when transitioning back to supine. She also reported pain resolution with this and was encouraged to progress resistance in supine at home until she returns. Also continued with therex to promote cervical/thoracic rotation mobility. Pt denied any increase in Lt UE symptoms or cervical pain this session.    Personal Factors and Comorbidities  Profession;Comorbidity 1    Comorbidities  chronic pain    Examination-Activity Limitations  Reach Overhead;Carry;Sleep;Other    Stability/Clinical Decision Making  Evolving/Moderate complexity    Rehab Potential  Excellent    PT Frequency  2x / week    PT Duration  8 weeks    PT Treatment/Interventions  ADLs/Self Care Home Management;Cryotherapy;Electrical Stimulation;Moist Heat;Traction;Therapeutic activities;Therapeutic exercise;Neuromuscular re-education;Manual techniques;Patient/family education;Passive range of motion;Dry needling;Taping    PT Next Visit Plan  f/u on shoulder pain with diagonals and return to seated if able to do without pain; progress scap strength; thoracic extension/rotation mobility.    PT Home Exercise Plan  Access Code: [email or web address]     Consulted and Agree with Plan of Care  Patient       Patient will benefit from skilled therapeutic intervention  in order to improve the following deficits and impairments:  Pain, Increased fascial restricitons, Increased muscle spasms, Postural dysfunction, Decreased range of motion, Decreased strength  Visit Diagnosis: 1. Muscle weakness (generalized)   2. Cervicalgia   3. Pain in left upper arm   4. Abnormal posture        Problem List Patient Active Problem List   Diagnosis Date Noted  . Genetic testing 12/06/2017  . Melanoma of skin ([place]) 11/11/2017  . Family history of prostate cancer   . Family history of breast cancer   . Family history of melanoma   . Family history of lung cancer   . Family history of throat cancer   . Adenocarcinoma of endometrium ([place]) 08/23/2017    9:27 AM,11/23/18 Sherol Dade PT, DPT [facility] [address], [place], [place], [address] Phone: [phone]   Fax:  [phone]  Name: Tina Ray MRN: [id number] Date of Birth: [birth year]

## 2018-11-23 NOTE — Patient Instructions (Signed)
Access Code: [email or web address]  URL: [email or web address]  Date: 11/23/2018  Prepared by: Sherol Dade   Exercises  Correct Seated Posture - 10 reps - 3 sets - 1x daily - 7x weekly  Doorway Pec Stretch at 90 Degrees Abduction - 3 reps - 1 sets - 30 sec hold - 1x daily - 7x weekly  Standing Isometric Cervical Retraction with Chin Tucks and Ball at [employer] - 15 reps - 1 sets - 3 hold - 2x daily - 7x weekly  Squatting Shoulder Row with Anchored Resistance - 10 reps - 3 sets - 1x daily - 7x weekly  Seated Shoulder Horizontal Abduction with Resistance - Thumbs Up - 15 reps - 2 sets - 1x daily - 7x weekly  Seated Shoulder Diagonal with Resistance - 15 reps - 2 sets - 1x daily - 7x weekly  Sidelying Open Book Thoracic Lumbar Rotation and Extension - 10 reps - 2x daily - 7x weekly    [facility] [address], [place], [place] [address] Phone # [phone] Fax [phone]

## 2018-11-27 ENCOUNTER — Other Ambulatory Visit: Payer: Self-pay

## 2018-11-27 ENCOUNTER — Ambulatory Visit: Payer: Federal, State, Local not specified - PPO | Admitting: Physical Therapy

## 2018-11-27 ENCOUNTER — Encounter: Payer: Self-pay | Admitting: Physical Therapy

## 2018-11-27 DIAGNOSIS — R293 Abnormal posture: Secondary | ICD-10-CM | POA: Diagnosis not present

## 2018-11-27 DIAGNOSIS — M6281 Muscle weakness (generalized): Secondary | ICD-10-CM | POA: Diagnosis not present

## 2018-11-27 DIAGNOSIS — M79622 Pain in left upper arm: Secondary | ICD-10-CM | POA: Diagnosis not present

## 2018-11-27 DIAGNOSIS — M542 Cervicalgia: Secondary | ICD-10-CM

## 2018-11-27 NOTE — Therapy (Signed)
[facility] [address] [place], [place], [address] Phone: [phone]   Fax:  [phone]  Physical Therapy Treatment  Patient Details  Name: Tina Ray MRN: [id number] Date of Birth: 1970-04-07 Referring Provider (PT): Orpah Melter, MD   Encounter Date: 11/27/2018  PT End of Session - 11/27/18 0805    Visit Number  12    Date for PT Re-Evaluation  12/08/18    Authorization Type  BCBS    PT Start Time  0804    PT Stop Time  0845    PT Time Calculation (min)  41 min    Activity Tolerance  Patient tolerated treatment well;No increased pain    Behavior During Therapy  WFL for tasks assessed/performed       Past Medical History:  Diagnosis Date  . Family history of breast cancer   . Family history of lung cancer   . Family history of melanoma   . Family history of prostate cancer   . Family history of throat cancer   . Hypertension   . IBS (irritable bowel syndrome)   . Melanoma of skin ([place]) 11/11/2017  . Peripheral vascular disease ([place])   . Skin cancer   . Skin cancer (melanoma) ([place])    1999    Past Surgical History:  Procedure Laterality Date  . BREAST BIOPSY Left 03/04/2016  . BREAST BIOPSY Left 03/06/2015  . DENTAL SURGERY     wisedom tooth extraction  . LYMPH NODE BIOPSY N/A 09/12/2017   Procedure: SENTINEL LYMPH NODE BIOPSY;  Surgeon: Everitt Amber, MD;  [facility]: [facility];  Service: Gynecology;  Laterality: N/A;  . ROBOTIC ASSISTED TOTAL HYSTERECTOMY WITH BILATERAL SALPINGO OOPHERECTOMY Bilateral 09/12/2017   Procedure: XI ROBOTIC ASSISTED TOTAL LAPAROSCOPIC  HYSTERECTOMY WITH BILATERAL SALPINGO OOPHORECTOMY;  Surgeon: Everitt Amber, MD;  [facility];  Service: Gynecology;  Laterality: Bilateral;    There were no vitals filed for this visit.  Subjective Assessment - 11/27/18 0806    Subjective  No new complaints. I got an exercise ball and I am excited to start using it.    Currently in  Pain?  No/denies    Multiple Pain Sites  No                       [facility] Adult PT Treatment/Exercise - 11/27/18 0001      Shoulder Exercises: Supine   Horizontal ABduction  Strengthening;Both;10 reps;Theraband    Theraband Level (Shoulder Horizontal ABduction)  Level 3 (Green)    Other Supine Exercises  3# protraction/retraction on roll 10x    Other Supine Exercises  Foam roll core stabilization , shoulder flexibility, thoracic mobiization.       Shoulder Exercises: Seated   Flexion  Strengthening;Both;10 reps;Weights    Flexion Weight (lbs)  3   sitting on ball, VC for cerv retraction   Flexion Limitations  Scaption Bil 3# 10x on ball with cervical retraction    Abduction  Strengthening;Both;10 reps;Weights    ABduction Weight (lbs)  3   Sitting on ball with VC for retraction     Shoulder Exercises: ROM/Strengthening   UBE (Upper Arm Bike)  L3 3x3 with status update               PT Short Term Goals - 11/13/18 0943      PT SHORT TERM GOAL #2   Title  Pt will report 30% less pain for improved sleep  Time  4    Period  Weeks    Status  Achieved   50%       PT Long Term Goals - 11/23/18 0916      PT LONG TERM GOAL #1   Title  pt will be able to roll easily in bed without increased pain    Baseline  Pt has improved awareness but not consistent with this    Time  8    Period  Weeks    Status  Achieved      PT LONG TERM GOAL #2   Title  Pt will report 70% less pain at night for improved sleep    Baseline  75%    Time  8    Period  Weeks    Status  Achieved      PT LONG TERM GOAL #3   Title  Pt will demonstrate at least 50 degrees of cervical rotation  bilaterally for ability to back out of parking spaces    Baseline  Lt 50 deg; Rt 55 deg    Time  8    Period  Weeks    Status  Achieved      PT LONG TERM GOAL #4   Title  Pt will demonstrate improved UE strength to MMT 5/5 shoulder flexion and abduction bilaterally for ability to do  household chores.    Time  8    Period  Weeks    Status  On-going      PT LONG TERM GOAL #5   Title  pt will be ind with advanced HEP    Time  8    Period  Weeks    Status  New            Plan - 11/27/18 0818    Clinical Impression Statement  Pt has purchased foam roll for home. We went over a variety of exercises she can do at home for core stabilization, shoulder and neck ROM/flexibility and thoracic mobilization. Pt mostly need verbal cuing for not using/gripping with her LE  vs using her core when we do a core focused exercise.    Personal Factors and Comorbidities  Profession;Comorbidity 1    Comorbidities  chronic pain    Examination-Activity Limitations  Reach Overhead;Carry;Sleep;Other    Stability/Clinical Decision Making  Evolving/Moderate complexity    Rehab Potential  Excellent    PT Frequency  2x / week    PT Duration  8 weeks    PT Treatment/Interventions  ADLs/Self Care Home Management;Cryotherapy;Electrical Stimulation;Moist Heat;Traction;Therapeutic activities;Therapeutic exercise;Neuromuscular re-education;Manual techniques;Patient/family education;Passive range of motion;Dry needling;Taping    PT Next Visit Plan  progress scap strength; thoracic extension/rotation mobility.    PT Home Exercise Plan  Access Code: [email or web address]    Consulted and Agree with Plan of Care  Patient       Patient will benefit from skilled therapeutic intervention in order to improve the following deficits and impairments:  Pain, Increased fascial restricitons, Increased muscle spasms, Postural dysfunction, Decreased range of motion, Decreased strength  Visit Diagnosis: Muscle weakness (generalized)  Cervicalgia  Pain in left upper arm  Abnormal posture     Problem List Patient Active Problem List   Diagnosis Date Noted  . Genetic testing 12/06/2017  . Melanoma of skin ([place]) 11/11/2017  . Family history of prostate cancer   . Family history of breast cancer   . Family  history of melanoma   . Family history of lung cancer   . Family  history of throat cancer   . Adenocarcinoma of endometrium ([place]) 08/23/2017    Aalivia Mcgraw, PTA 11/27/2018, 8:46 AM  [facility] [address], [place], [place], [address] Phone: [phone]   Fax:  [phone]  Name: BREELEY PEPPLE MRN: [id number] Date of Birth: 03-Aug-1970

## 2018-11-29 ENCOUNTER — Other Ambulatory Visit: Payer: Self-pay

## 2018-11-29 ENCOUNTER — Ambulatory Visit: Payer: Federal, State, Local not specified - PPO | Admitting: Physical Therapy

## 2018-11-29 ENCOUNTER — Encounter: Payer: Self-pay | Admitting: Physical Therapy

## 2018-11-29 DIAGNOSIS — M6281 Muscle weakness (generalized): Secondary | ICD-10-CM | POA: Diagnosis not present

## 2018-11-29 DIAGNOSIS — R293 Abnormal posture: Secondary | ICD-10-CM

## 2018-11-29 DIAGNOSIS — M79622 Pain in left upper arm: Secondary | ICD-10-CM | POA: Diagnosis not present

## 2018-11-29 DIAGNOSIS — M542 Cervicalgia: Secondary | ICD-10-CM | POA: Diagnosis not present

## 2018-11-29 NOTE — Therapy (Signed)
[facility] [address] [place], [place], [address] Phone: [phone]   Fax:  [phone]  Physical Therapy Treatment  Patient Details  Name: Tina Ray MRN: [id number] Date of Birth: 1971/02/23 Referring Provider (PT): Orpah Melter, MD   Encounter Date: 11/29/2018  PT End of Session - 11/29/18 0807    Visit Number  13    Date for PT Re-Evaluation  12/08/18    Authorization Type  BCBS    PT Start Time  0804    PT Stop Time  0846    PT Time Calculation (min)  42 min    Activity Tolerance  Patient tolerated treatment well    Behavior During Therapy  [facility] for tasks assessed/performed       Past Medical History:  Diagnosis Date  . Family history of breast cancer   . Family history of lung cancer   . Family history of melanoma   . Family history of prostate cancer   . Family history of throat cancer   . Hypertension   . IBS (irritable bowel syndrome)   . Melanoma of skin ([place]) 11/11/2017  . Peripheral vascular disease ([place])   . Skin cancer   . Skin cancer (melanoma) ([place])    1999    Past Surgical History:  Procedure Laterality Date  . BREAST BIOPSY Left 03/04/2016  . BREAST BIOPSY Left 03/06/2015  . DENTAL SURGERY     wisedom tooth extraction  . LYMPH NODE BIOPSY N/A 09/12/2017   Procedure: SENTINEL LYMPH NODE BIOPSY;  Surgeon: Everitt Amber, MD;  [facility]: [facility];  Service: Gynecology;  Laterality: N/A;  . ROBOTIC ASSISTED TOTAL HYSTERECTOMY WITH BILATERAL SALPINGO OOPHERECTOMY Bilateral 09/12/2017   Procedure: XI ROBOTIC ASSISTED TOTAL LAPAROSCOPIC  HYSTERECTOMY WITH BILATERAL SALPINGO OOPHORECTOMY;  Surgeon: Everitt Amber, MD;  [facility];  Service: Gynecology;  Laterality: Bilateral;    There were no vitals filed for this visit.  Subjective Assessment - 11/29/18 0809    Subjective  I am feeling good. My right side started talking to me a few days ago. I need to strengthen both sides.    Currently in Pain?  No/denies    Multiple Pain Sites  No                       [facility] Adult PT Treatment/Exercise - 11/29/18 0001      Lumbar Exercises: Seated   Hip Flexion on Ball  Strengthening;Both;20 reps   VC for deeper core activation   Other Seated Lumbar Exercises  [employer] sitting on ball 15x      Lumbar Exercises: Supine   Other Supine Lumbar Exercises  Core stabilization ex on foam roll .       Shoulder Exercises: Supine   Horizontal ABduction  Strengthening;Both;10 reps;Theraband    Theraband Level (Shoulder Horizontal ABduction)  Level 3 (Green)   on foam roll, VC to engageher core   Other Supine Exercises  3# protraction/retraction on roll 15x    Other Supine Exercises  Foam roll core stabilization , shoulder flexibility, thoracic mobiization.       Shoulder Exercises: Seated   Flexion  Strengthening;Both;10 reps;Weights    Flexion Weight (lbs)  3   sitting on ball, VC for cerv retraction   Flexion Limitations  Scaption Bil 3# 10x on ball with cervical retraction    Abduction  Strengthening;Both;10 reps;Weights    ABduction Weight (lbs)  3   Sitting on ball  with VC for retraction   Diagonals  Strengthening;Both;10 reps;Theraband   Sitting on the ball   Theraband Level (Shoulder Diagonals)  Level 3 (Green)      Shoulder Exercises: ROM/Strengthening   UBE (Upper Arm Bike)  L3 3x3 with status update               PT Short Term Goals - 11/13/18 0943      PT SHORT TERM GOAL #2   Title  Pt will report 30% less pain for improved sleep    Time  4    Period  Weeks    Status  Achieved   50%       PT Long Term Goals - 11/23/18 0916      PT LONG TERM GOAL #1   Title  pt will be able to roll easily in bed without increased pain    Baseline  Pt has improved awareness but not consistent with this    Time  8    Period  Weeks    Status  Achieved      PT LONG TERM GOAL #2   Title  Pt will report 70% less pain at night for improved  sleep    Baseline  75%    Time  8    Period  Weeks    Status  Achieved      PT LONG TERM GOAL #3   Title  Pt will demonstrate at least 50 degrees of cervical rotation  bilaterally for ability to back out of parking spaces    Baseline  Lt 50 deg; Rt 55 deg    Time  8    Period  Weeks    Status  Achieved      PT LONG TERM GOAL #4   Title  Pt will demonstrate improved UE strength to MMT 5/5 shoulder flexion and abduction bilaterally for ability to do household chores.    Time  8    Period  Weeks    Status  On-going      PT LONG TERM GOAL #5   Title  pt will be ind with advanced HEP    Time  8    Period  Weeks    Status  New            Plan - 11/29/18 0808    Clinical Impression Statement  Pt reports her right side of the neck has begun to hurt "a little." She feels she now needs to concentrate on both sides regarding flexibility and strength. Pt is compliant with her HEP and fels very encouraged to continue progressing her personal health and wellness.    Personal Factors and Comorbidities  Profession;Comorbidity 1    Comorbidities  chronic pain    Examination-Activity Limitations  Reach Overhead;Carry;Sleep;Other    Stability/Clinical Decision Making  Evolving/Moderate complexity    Rehab Potential  Excellent    PT Frequency  2x / week    PT Duration  8 weeks    PT Treatment/Interventions  ADLs/Self Care Home Management;Cryotherapy;Electrical Stimulation;Moist Heat;Traction;Therapeutic activities;Therapeutic exercise;Neuromuscular re-education;Manual techniques;Patient/family education;Passive range of motion;Dry needling;Taping    PT Next Visit Plan  re-assessment visit to determine if pt ready for DC for has further needs to be addressed in PT. MMT & review goals/FOTO    PT Home Exercise Plan  Access Code: [email or web address]    Consulted and Agree with Plan of Care  Patient       Patient will benefit from skilled therapeutic intervention in  order to improve the following  deficits and impairments:  Pain, Increased fascial restricitons, Increased muscle spasms, Postural dysfunction, Decreased range of motion, Decreased strength  Visit Diagnosis: Muscle weakness (generalized)  Cervicalgia  Pain in left upper arm  Abnormal posture     Problem List Patient Active Problem List   Diagnosis Date Noted  . Genetic testing 12/06/2017  . Melanoma of skin ([place]) 11/11/2017  . Family history of prostate cancer   . Family history of breast cancer   . Family history of melanoma   . Family history of lung cancer   . Family history of throat cancer   . Adenocarcinoma of endometrium ([place]) 08/23/2017    Delos Klich, PTA 11/29/2018, 8:48 AM  [facility] [address], [place], [place], [address] Phone: [phone]   Fax:  [phone]  Name: Tina Ray MRN: [id number] Date of Birth: [birth year]

## 2018-12-05 ENCOUNTER — Encounter: Payer: Self-pay | Admitting: Physical Therapy

## 2018-12-05 ENCOUNTER — Other Ambulatory Visit: Payer: Self-pay

## 2018-12-05 ENCOUNTER — Ambulatory Visit: Payer: Federal, State, Local not specified - PPO | Attending: Family Medicine | Admitting: Physical Therapy

## 2018-12-05 DIAGNOSIS — M542 Cervicalgia: Secondary | ICD-10-CM | POA: Diagnosis not present

## 2018-12-05 DIAGNOSIS — M79622 Pain in left upper arm: Secondary | ICD-10-CM | POA: Diagnosis not present

## 2018-12-05 DIAGNOSIS — M6281 Muscle weakness (generalized): Secondary | ICD-10-CM

## 2018-12-05 DIAGNOSIS — R293 Abnormal posture: Secondary | ICD-10-CM | POA: Diagnosis not present

## 2018-12-05 DIAGNOSIS — M25511 Pain in right shoulder: Secondary | ICD-10-CM | POA: Diagnosis not present

## 2018-12-05 NOTE — Patient Instructions (Signed)
Access Code: [email or web address]  URL: [email or web address]  Date: 12/05/2018  Prepared by: Sherol Dade   Exercises  Correct Seated Posture - 10 reps - 3 sets - 1x daily - 7x weekly  Standing Isometric Cervical Retraction with Chin Tucks and Ball at [employer] - 15 reps - 1 sets - 3 hold - 2x daily - 7x weekly  Squatting Shoulder Row with Anchored Resistance - 10 reps - 3 sets - 1x daily - 7x weekly  Seated Shoulder Horizontal Abduction with Resistance - Thumbs Up - 15 reps - 2 sets - 1x daily - 7x weekly  Shoulder Internal Rotation with Resistance - 10-15 reps - 1-2 sets - 1x daily - 7x weekly  Seated Shoulder External Rotation with Resistance - 10 reps - 1-2 sets - 1x daily - 7x weekly  Sidelying Open Book Thoracic Lumbar Rotation and Extension - 10 reps - 2x daily - 7x weekly    [facility] [address], [place], [place] [address] Phone # [phone] Fax [phone]

## 2018-12-05 NOTE — Therapy (Addendum)
[facility] [address] [place], [place], [address] Phone: [phone]   Fax:  [phone]  Physical Therapy Treatment/Re-Evaluation  Patient Details  Name: Tina Ray MRN: [id number] Date of Birth: 12/21/1970 Referring Provider (PT): Orpah Melter, MD   Encounter Date: 12/05/2018  PT End of Session - 12/05/18 0902    Visit Number  14    Date for PT Re-Evaluation  01/23/19    Authorization Type  BCBS    PT Start Time  [phone]   pt arrived late to appointment   PT Stop Time  0900    PT Time Calculation (min)  36 min    Activity Tolerance  Patient tolerated treatment well    Behavior During Therapy  [facility] for tasks assessed/performed       Past Medical History:  Diagnosis Date  . Family history of breast cancer   . Family history of lung cancer   . Family history of melanoma   . Family history of prostate cancer   . Family history of throat cancer   . Hypertension   . IBS (irritable bowel syndrome)   . Melanoma of skin ([place]) 11/11/2017  . Peripheral vascular disease ([place])   . Skin cancer   . Skin cancer (melanoma) ([place])    1999    Past Surgical History:  Procedure Laterality Date  . BREAST BIOPSY Left 03/04/2016  . BREAST BIOPSY Left 03/06/2015  . DENTAL SURGERY     wisedom tooth extraction  . LYMPH NODE BIOPSY N/A 09/12/2017   Procedure: SENTINEL LYMPH NODE BIOPSY;  Surgeon: Everitt Amber, MD;  Location: [facility];  Service: Gynecology;  Laterality: N/A;  . ROBOTIC ASSISTED TOTAL HYSTERECTOMY WITH BILATERAL SALPINGO OOPHERECTOMY Bilateral 09/12/2017   Procedure: XI ROBOTIC ASSISTED TOTAL LAPAROSCOPIC  HYSTERECTOMY WITH BILATERAL SALPINGO OOPHORECTOMY;  Surgeon: Everitt Amber, MD;  Location: [facility];  Service: Gynecology;  Laterality: Bilateral;    There were no vitals filed for this visit.  Subjective Assessment - 12/05/18 0825    Subjective  Pt states that she is not having any issues with the Lt shoulder  or neck. This is only an issue with sleeping and even this is 75% improved. She has been noticing pain in the Rt shoulder which will go down the outside of her arm above the elbow. She mostly will feel this at night.    Currently in Pain?  No/denies         [facility] PT Assessment - 12/05/18 0001      Assessment   Medical Diagnosis  M54.2 (ICD-10-CM) - Cervicalgia    Referring Provider (PT)  Orpah Melter, MD    Hand Dominance  Right    Prior Therapy  No      Precautions   Precautions  None      Restrictions   Weight Bearing Restrictions  No      Balance Screen   Has the patient fallen in the past 6 months  No    Has the patient had a decrease in activity level because of a fear of falling?   No    Is the patient reluctant to leave their home because of a fear of falling?   No      Home Environment   Living Environment  Private residence    Living Arrangements  Spouse/significant other      Prior Function   Level of Independence  Independent    Vocation  Full time employment  Vocation Requirements  computer    Leisure  cooking, walking      Cognition   Overall Cognitive Status  Within Functional Limits for tasks assessed      Observation/Other Assessments   Focus on Therapeutic Outcomes (FOTO)   32% limited       Sensation   Additional Comments  denies numbness/tingling      Posture/Postural Control   Posture/Postural Control  Postural limitations    Postural Limitations  Forward head      AROM   Overall AROM Comments  shoulder flex Rt 160; Lt 160    Cervical Flexion  30    Cervical Extension  30    Cervical - Right Side Bend  25   pain free    Cervical - Left Side Bend  25   feels pulling on upper Lt back   Cervical - Right Rotation  55   pain free    Cervical - Left Rotation  55   pain free      Strength   Overall Strength Comments  cervical rotation Lt 4-/5 +pain; Rt 4+/5 a little discomfort    Right Shoulder Flexion  4+/5    Right Shoulder ABduction   4+/5   (+) pain    Right Shoulder Internal Rotation  4+/5   (+) pain    Right Shoulder External Rotation  4+/5    Right Shoulder Horizontal ABduction  5/5    Right Shoulder Horizontal ADduction  5/5    Left Shoulder Flexion  4+/5    Left Shoulder ABduction  4+/5   pain free   Left Shoulder Internal Rotation  4+/5    Left Shoulder External Rotation  4+/5    Left Shoulder Horizontal ABduction  5/5    Left Shoulder Horizontal ADduction  5/5    Right Hand Grip (lbs)  50    Left Hand Grip (lbs)  45      Palpation   Palpation comment  Tigth and TTP Lt>Rt upper traps, scalenes, cervical paraspinals, suboccipitals, Lt pectoraslis, TTPposterior humeral head      Special Tests    Special Tests  --    Cervical Tests  --      Spurling's   Findings  --    Side  --      Ambulation/Gait   Gait Pattern  Within Functional Limits                   [facility] Adult PT Treatment/Exercise - 12/05/18 0001      Shoulder Exercises: Standing   External Rotation  Right;10 reps    Theraband Level (Shoulder External Rotation)  Level 1 (Yellow)    External Rotation Limitations  unable with red due to pain     Internal Rotation  Strengthening;Right;10 reps    Theraband Level (Shoulder Internal Rotation)  Level 2 (Red)             PT Education - 12/05/18 0910    Education Details  updated HEP    Person(s) Educated  Patient    Methods  Explanation;Handout    Comprehension  Verbalized understanding       PT Short Term Goals - 12/05/18 0843      PT SHORT TERM GOAL #1   Title  pt will report centralizing of symptoms with no symptoms in Lt arm    Time  4    Period  Weeks    Status  Achieved    Target Date  11/10/18      PT SHORT TERM GOAL #2   Title  Pt will report 30% less pain for improved sleep    Baseline  75% improved    Time  4    Period  Weeks    Status  Achieved    Target Date  11/10/18        PT Long Term Goals - 12/05/18 0843      PT LONG TERM GOAL #1    Title  pt will be able to roll easily in bed without increased pain    Baseline  Pt has improved awareness but not consistent with this    Time  6    Period  Weeks    Status  Achieved      PT LONG TERM GOAL #2   Title  Pt will report 70% less pain at night for improved sleep    Baseline  75%    Time  6    Period  Weeks    Status  Achieved      PT LONG TERM GOAL #3   Title  Pt will demonstrate at least 60 degrees of cervical rotation bilaterally for ability to back out of parking spaces    Baseline  MET: Pt will demonstrate at least 50 degrees of cervical rotation  bilaterally for ability to back out of parking spaces    Time  6    Period  Weeks    Status  Revised      PT LONG TERM GOAL #4   Title  Pt will demonstrate improved UE strength to MMT 5/5 shoulder flexion and abduction bilaterally for ability to do household chores.    Baseline  4+/5 MMT with abduction, flexion and Rt shoulder IR/ER (+) pain on the Rt    Time  6    Period  Weeks    Status  Partially Met      PT LONG TERM GOAL #5   Title  pt will be ind with advanced HEP    Time  6    Period  Weeks    Status  New      Additional Long Term Goals   Additional Long Term Goals  Yes      PT LONG TERM GOAL #6   Title  Pt will report atleast 50% improvement in her pain and cervical ROM with driving so that she can check her blind spots without difficulty.    Time  6            Plan - 12/05/18 3149    Clinical Impression Statement  Pt is making steady progress towards her goals, noting significant improvements in her Lt shoulder pain and symptoms the past several weeks. She will have intermittent discomfort on the Lt shoulder with sleeping, but primarily she is noting issues with her Rt UE despite therapist attempts to make adjustments with UE strengthening therex. Pt has increased cervical rotation ROM to atleast 50 deg, and her shoulder active ROM is full and pain free. Strength testing of BUEs is pain free on the  Lt, but pain on the Rt with internal rotation and abduction. In addition, she is limited functional with checking her blind spots while driving. Pt would continue to benefit from skilled PT with decreased frequency moving forward in order to address Rt shoulder pain, bilateral shoulder weakness and increased cervical active/passive ROM which will allow her to confidently transition to an advanced HEP for maintained wellness.  Personal Factors and Comorbidities  Profession;Comorbidity 1    Comorbidities  chronic pain    Examination-Activity Limitations  Reach Overhead;Carry;Sleep;Other    Stability/Clinical Decision Making  Evolving/Moderate complexity    Rehab Potential  Excellent    PT Frequency  2x / week    PT Duration  8 weeks    PT Treatment/Interventions  ADLs/Self Care Home Management;Cryotherapy;Electrical Stimulation;Moist Heat;Traction;Therapeutic activities;Therapeutic exercise;Neuromuscular re-education;Manual techniques;Patient/family education;Passive range of motion;Dry needling;Taping    PT Next Visit Plan  re-assessment visit to determine if pt ready for DC for has further needs to be addressed in PT. MMT & review goals/FOTO    PT Home Exercise Plan  Access Code: [email or web address]    Consulted and Agree with Plan of Care  Patient       Patient will benefit from skilled therapeutic intervention in order to improve the following deficits and impairments:  Pain, Increased fascial restricitons, Increased muscle spasms, Postural dysfunction, Decreased range of motion, Decreased strength  Visit Diagnosis: Muscle weakness (generalized) - Plan: PT plan of care cert/re-cert  Cervicalgia - Plan: PT plan of care cert/re-cert  Pain in left upper arm - Plan: PT plan of care cert/re-cert  Abnormal posture - Plan: PT plan of care cert/re-cert  Acute pain of right shoulder - Plan: PT plan of care cert/re-cert     Problem List Patient Active Problem List   Diagnosis Date Noted  . Genetic  testing 12/06/2017  . Melanoma of skin ([place]) 11/11/2017  . Family history of prostate cancer   . Family history of breast cancer   . Family history of melanoma   . Family history of lung cancer   . Family history of throat cancer   . Adenocarcinoma of endometrium ([place]) 08/23/2017    Sherol Dade PT, DPT [facility] [address] [place], [place], [address] Phone: [phone]   Fax:  [phone]  Name: KEIANA TAVELLA MRN: [id number] Date of Birth: 1971-02-11  *addendum to update end certification date  0:63 AM,12/05/18 Sherol Dade PT, [facility]

## 2018-12-05 NOTE — Addendum Note (Signed)
Addended bySherol Dade on: 12/05/2018 09:21 AM   Modules accepted: Orders

## 2018-12-15 ENCOUNTER — Encounter: Payer: Self-pay | Admitting: Physical Therapy

## 2018-12-15 ENCOUNTER — Ambulatory Visit: Payer: Federal, State, Local not specified - PPO | Admitting: Physical Therapy

## 2018-12-15 ENCOUNTER — Other Ambulatory Visit: Payer: Self-pay

## 2018-12-15 DIAGNOSIS — R293 Abnormal posture: Secondary | ICD-10-CM

## 2018-12-15 DIAGNOSIS — M542 Cervicalgia: Secondary | ICD-10-CM

## 2018-12-15 DIAGNOSIS — M25511 Pain in right shoulder: Secondary | ICD-10-CM | POA: Diagnosis not present

## 2018-12-15 DIAGNOSIS — M79622 Pain in left upper arm: Secondary | ICD-10-CM

## 2018-12-15 DIAGNOSIS — M6281 Muscle weakness (generalized): Secondary | ICD-10-CM | POA: Diagnosis not present

## 2018-12-15 NOTE — Therapy (Signed)
[facility] [address], [place], [place], [address] Phone: [phone]   Fax:  [phone]  Physical Therapy Treatment  Patient Details  Name: GENENE KILMAN MRN: [id number] Date of Birth: 1970/10/04 Referring Provider (PT): Orpah Melter, MD   Encounter Date: 12/15/2018  PT End of Session - 12/15/18 0830    Visit Number  15    Date for PT Re-Evaluation  01/23/19    Authorization Type  BCBS    PT Start Time  0820    PT Stop Time  0900    PT Time Calculation (min)  40 min    Activity Tolerance  Patient tolerated treatment well;No increased pain    Behavior During Therapy  W[facility] for tasks assessed/performed       Past Medical History:  Diagnosis Date  . Family history of breast cancer   . Family history of lung cancer   . Family history of melanoma   . Family history of prostate cancer   . Family history of throat cancer   . Hypertension   . IBS (irritable bowel syndrome)   . Melanoma of skin ([place]) 11/11/2017  . Peripheral vascular disease ([place])   . Skin cancer   . Skin cancer (melanoma) ([place])    1999    Past Surgical History:  Procedure Laterality Date  . BREAST BIOPSY Left 03/04/2016  . BREAST BIOPSY Left 03/06/2015  . DENTAL SURGERY     wisedom tooth extraction  . LYMPH NODE BIOPSY N/A 09/12/2017   Procedure: SENTINEL LYMPH NODE BIOPSY;  Surgeon: Everitt Amber, MD;  Location: [facility];  Service: Gynecology;  Laterality: N/A;  . ROBOTIC ASSISTED TOTAL HYSTERECTOMY WITH BILATERAL SALPINGO OOPHERECTOMY Bilateral 09/12/2017   Procedure: XI ROBOTIC ASSISTED TOTAL LAPAROSCOPIC  HYSTERECTOMY WITH BILATERAL SALPINGO OOPHORECTOMY;  Surgeon: Everitt Amber, MD;  Location: [facility];  Service: Gynecology;  Laterality: Bilateral;    There were no vitals filed for this visit.  Subjective Assessment - 12/15/18 0821    Subjective  Pt states that she was not able to do her HEP while on vacation. She started having some  issues with both of her shoulders at night. It is just the ball of the shoulder that hurts and she denies burning or pain down the arm.    Currently in Pain?  No/denies                       [facility] Adult PT Treatment/Exercise - 12/15/18 0001      Neck Exercises: Machines for Strengthening   UBE (Upper Arm Bike)  L1 x4 min forward seat 11      Neck Exercises: Seated   Neck Retraction  10 reps    Neck Retraction Limitations  pressing ball into wall, mirror feedback to decrease trunk extension    Cervical Rotation  Left;Right;10 reps    Cervical Rotation Limitations  active assist SNAG with towel     Other Seated Exercise  cervical retraction with rotation Lt/Rt x15 reps small green ball against wall and mirror feedback      Shoulder Exercises: Sidelying   External Rotation  Right;Strengthening;15 reps    External Rotation Weight (lbs)  2    ABduction  Strengthening;Right;Left;15 reps    ABduction Weight (lbs)  2    Other Sidelying Exercises  Rt shoulder horizontal abduction 2# x15 reps       Shoulder Exercises: Stretch   Cross Chest Stretch  3 reps;20 seconds  Cross Chest Stretch Limitations  Lt and Rt              PT Education - 12/15/18 0849    Education Details  technique with therex    Person(s) Educated  Patient    Methods  Explanation;Verbal cues    Comprehension  Verbalized understanding;Returned demonstration       PT Short Term Goals - 12/05/18 0843      PT SHORT TERM GOAL #1   Title  pt will report centralizing of symptoms with no symptoms in Lt arm    Time  4    Period  Weeks    Status  Achieved    Target Date  11/10/18      PT SHORT TERM GOAL #2   Title  Pt will report 30% less pain for improved sleep    Baseline  75% improved    Time  4    Period  Weeks    Status  Achieved    Target Date  11/10/18        PT Long Term Goals - 12/05/18 0843      PT LONG TERM GOAL #1   Title  pt will be able to roll easily in bed without  increased pain    Baseline  Pt has improved awareness but not consistent with this    Time  6    Period  Weeks    Status  Achieved      PT LONG TERM GOAL #2   Title  Pt will report 70% less pain at night for improved sleep    Baseline  75%    Time  6    Period  Weeks    Status  Achieved      PT LONG TERM GOAL #3   Title  Pt will demonstrate at least 60 degrees of cervical rotation bilaterally for ability to back out of parking spaces    Baseline  MET: Pt will demonstrate at least 50 degrees of cervical rotation  bilaterally for ability to back out of parking spaces    Time  6    Period  Weeks    Status  Revised      PT LONG TERM GOAL #4   Title  Pt will demonstrate improved UE strength to MMT 5/5 shoulder flexion and abduction bilaterally for ability to do household chores.    Baseline  4+/5 MMT with abduction, flexion and Rt shoulder IR/ER (+) pain on the Rt    Time  6    Period  Weeks    Status  Partially Met      PT LONG TERM GOAL #5   Title  pt will be ind with advanced HEP    Time  6    Period  Weeks    Status  New      Additional Long Term Goals   Additional Long Term Goals  Yes      PT LONG TERM GOAL #6   Title  Pt will report atleast 50% improvement in her pain and cervical ROM with driving so that she can check her blind spots without difficulty.    Time  6            Plan - 12/15/18 0850    Clinical Impression Statement  Pt was unable to keep with her HEP over the week while on vacation and she seemed to notice increase in bilateral shoulder soreness while sleeping. No pain noted upon arrival.  Focused on improving cervical strength and control with retraction and rotation. Pt did require visual cuing which seemed to increase her understanding of the correct technique without trunk extension. Ended with posterior shoulder strengthening and pt had no pain increase with this. Will continue with current POC.    Personal Factors and Comorbidities   Profession;Comorbidity 1    Comorbidities  chronic pain    Examination-Activity Limitations  Reach Overhead;Carry;Sleep;Other    Stability/Clinical Decision Making  Evolving/Moderate complexity    Rehab Potential  Excellent    PT Frequency  2x / week    PT Duration  8 weeks    PT Treatment/Interventions  ADLs/Self Care Home Management;Cryotherapy;Electrical Stimulation;Moist Heat;Traction;Therapeutic activities;Therapeutic exercise;Neuromuscular re-education;Manual techniques;Patient/family education;Passive range of motion;Dry needling;Taping    PT Next Visit Plan  re-assessment visit to determine if pt ready for DC for has further needs to be addressed in PT. MMT & review goals/FOTO    PT Home Exercise Plan  Access Code: [email or web address]    Consulted and Agree with Plan of Care  Patient       Patient will benefit from skilled therapeutic intervention in order to improve the following deficits and impairments:  Pain, Increased fascial restricitons, Increased muscle spasms, Postural dysfunction, Decreased range of motion, Decreased strength  Visit Diagnosis: Muscle weakness (generalized)  Cervicalgia  Pain in left upper arm  Abnormal posture  Acute pain of right shoulder     Problem List Patient Active Problem List   Diagnosis Date Noted  . Genetic testing 12/06/2017  . Melanoma of skin ([place]) 11/11/2017  . Family history of prostate cancer   . Family history of breast cancer   . Family history of melanoma   . Family history of lung cancer   . Family history of throat cancer   . Adenocarcinoma of endometrium ([place]) 08/23/2017   8:59 AM,12/15/18 Sherol Dade PT, DPT [place] [facility] [address] [place], [place], [address] Phone: [phone]   Fax:  [phone]  Name: THERESE ROCCO MRN: [id number] Date of Birth: 1970/06/17

## 2018-12-21 ENCOUNTER — Ambulatory Visit: Payer: Federal, State, Local not specified - PPO | Admitting: Physical Therapy

## 2018-12-21 ENCOUNTER — Encounter: Payer: Self-pay | Admitting: Physical Therapy

## 2018-12-21 ENCOUNTER — Other Ambulatory Visit: Payer: Self-pay

## 2018-12-21 DIAGNOSIS — M6281 Muscle weakness (generalized): Secondary | ICD-10-CM | POA: Diagnosis not present

## 2018-12-21 DIAGNOSIS — M79622 Pain in left upper arm: Secondary | ICD-10-CM | POA: Diagnosis not present

## 2018-12-21 DIAGNOSIS — M25511 Pain in right shoulder: Secondary | ICD-10-CM

## 2018-12-21 DIAGNOSIS — M542 Cervicalgia: Secondary | ICD-10-CM | POA: Diagnosis not present

## 2018-12-21 DIAGNOSIS — R293 Abnormal posture: Secondary | ICD-10-CM

## 2018-12-21 NOTE — Therapy (Signed)
[facility] [address], [place], [place], [address] Phone: [phone]   Fax:  [phone]  Physical Therapy Treatment  Patient Details  Name: Tina Ray MRN: [id number] Date of Birth: 1970-05-19 Referring Provider (PT): Orpah Melter, MD   Encounter Date: 12/21/2018  PT End of Session - 12/21/18 0905    Visit Number  16    Date for PT Re-Evaluation  01/23/19    Authorization Type  BCBS    PT Start Time  0820    PT Stop Time  0900    PT Time Calculation (min)  40 min    Activity Tolerance  Patient tolerated treatment well;No increased pain    Behavior During Therapy  WFL for tasks assessed/performed       Past Medical History:  Diagnosis Date  . Family history of breast cancer   . Family history of lung cancer   . Family history of melanoma   . Family history of prostate cancer   . Family history of throat cancer   . Hypertension   . IBS (irritable bowel syndrome)   . Melanoma of skin ([place]) 11/11/2017  . Peripheral vascular disease ([place])   . Skin cancer   . Skin cancer (melanoma) ([place])    1999    Past Surgical History:  Procedure Laterality Date  . BREAST BIOPSY Left 03/04/2016  . BREAST BIOPSY Left 03/06/2015  . DENTAL SURGERY     wisedom tooth extraction  . LYMPH NODE BIOPSY N/A 09/12/2017   Procedure: SENTINEL LYMPH NODE BIOPSY;  Surgeon: Everitt Amber, MD;  Location: [facility];  Service: Gynecology;  Laterality: N/A;  . ROBOTIC ASSISTED TOTAL HYSTERECTOMY WITH BILATERAL SALPINGO OOPHERECTOMY Bilateral 09/12/2017   Procedure: XI ROBOTIC ASSISTED TOTAL LAPAROSCOPIC  HYSTERECTOMY WITH BILATERAL SALPINGO OOPHORECTOMY;  Surgeon: Everitt Amber, MD;  Location: [facility];  Service: Gynecology;  Laterality: Bilateral;    There were no vitals filed for this visit.  Subjective Assessment - 12/21/18 0823    Subjective  Pt states her Rt shoulder was a little sore with attempting her sidelying exercises at home.  She has no pain currently. She is still having some pain at night in bilateral shoulders, but this is not every night.    Currently in Pain?  No/denies                       [facility] Adult PT Treatment/Exercise - 12/21/18 0001      Self-Care   Other Self-Care Comments   sleeping adjustments with pillows to avoid direct pressure on shoulder      Shoulder Exercises: Supine   Flexion  AAROM;Right;15 reps      Shoulder Exercises: Prone   Other Prone Exercises  Rt and Lt UE rows #3 2x15 reps       Shoulder Exercises: Sidelying   External Rotation  Right;Strengthening;15 reps    External Rotation Weight (lbs)  3    External Rotation Limitations  cues for proper technique       Shoulder Exercises: Standing   Other Standing Exercises  Self Rt posterior shoulder massage with tennis ball against wall       Manual Therapy   Soft tissue mobilization  STM Lt and Rt deltoid              PT Education - 12/21/18 0841    Education Details  importance of avoiding too many additional exercises from her sessions; self posterior shoulder massage  Person(s) Educated  Patient    Methods  Explanation;Handout    Comprehension  Verbalized understanding;Returned demonstration       PT Short Term Goals - 12/05/18 0843      PT SHORT TERM GOAL #1   Title  pt will report centralizing of symptoms with no symptoms in Lt arm    Time  4    Period  Weeks    Status  Achieved    Target Date  11/10/18      PT SHORT TERM GOAL #2   Title  Pt will report 30% less pain for improved sleep    Baseline  75% improved    Time  4    Period  Weeks    Status  Achieved    Target Date  11/10/18        PT Long Term Goals - 12/05/18 0843      PT LONG TERM GOAL #1   Title  pt will be able to roll easily in bed without increased pain    Baseline  Pt has improved awareness but not consistent with this    Time  6    Period  Weeks    Status  Achieved      PT LONG TERM GOAL #2   Title  Pt  will report 70% less pain at night for improved sleep    Baseline  75%    Time  6    Period  Weeks    Status  Achieved      PT LONG TERM GOAL #3   Title  Pt will demonstrate at least 60 degrees of cervical rotation bilaterally for ability to back out of parking spaces    Baseline  MET: Pt will demonstrate at least 50 degrees of cervical rotation  bilaterally for ability to back out of parking spaces    Time  6    Period  Weeks    Status  Revised      PT LONG TERM GOAL #4   Title  Pt will demonstrate improved UE strength to MMT 5/5 shoulder flexion and abduction bilaterally for ability to do household chores.    Baseline  4+/5 MMT with abduction, flexion and Rt shoulder IR/ER (+) pain on the Rt    Time  6    Period  Weeks    Status  Partially Met      PT LONG TERM GOAL #5   Title  pt will be ind with advanced HEP    Time  6    Period  Weeks    Status  New      Additional Long Term Goals   Additional Long Term Goals  Yes      PT LONG TERM GOAL #6   Title  Pt will report atleast 50% improvement in her pain and cervical ROM with driving so that she can check her blind spots without difficulty.    Time  6            Plan - 12/21/18 0905    Clinical Impression Statement  Pt has increased participation in HEP at home since her vacation without complaints. Her pain at night in bilateral shoulders has decreased frequency but therapist made some adjustments to sleep position in order to decrease pressure points on her shoulder moving forward. Pt had some tenderness and palpable trigger points in the posterior shoulder, so therapist instructed her in self-massage with a tennis ball. Pt tends to complete all of  the exercises during her sessions in addition to the ones on her HEP which is likely placing too much demand on her UE. Therapist educated her on the importance of finding the proper load for her muscle and only completing what is instructed in the handout. She verbalized  understanding of this.    Personal Factors and Comorbidities  Profession;Comorbidity 1    Comorbidities  chronic pain    Examination-Activity Limitations  Reach Overhead;Carry;Sleep;Other    Stability/Clinical Decision Making  Evolving/Moderate complexity    Rehab Potential  Excellent    PT Frequency  2x / week    PT Duration  8 weeks    PT Treatment/Interventions  ADLs/Self Care Home Management;Cryotherapy;Electrical Stimulation;Moist Heat;Traction;Therapeutic activities;Therapeutic exercise;Neuromuscular re-education;Manual techniques;Patient/family education;Passive range of motion;Dry needling;Taping    PT Next Visit Plan  f/u on sleeping adjustments; posterior shoulder STM; progressing cervical rotation ROM/strength    PT Home Exercise Plan  Access Code: [email or web address]    Consulted and Agree with Plan of Care  Patient       Patient will benefit from skilled therapeutic intervention in order to improve the following deficits and impairments:  Pain, Increased fascial restricitons, Increased muscle spasms, Postural dysfunction, Decreased range of motion, Decreased strength  Visit Diagnosis: Muscle weakness (generalized)  Cervicalgia  Pain in left upper arm  Abnormal posture  Acute pain of right shoulder     Problem List Patient Active Problem List   Diagnosis Date Noted  . Genetic testing 12/06/2017  . Melanoma of skin ([place]) 11/11/2017  . Family history of prostate cancer   . Family history of breast cancer   . Family history of melanoma   . Family history of lung cancer   . Family history of throat cancer   . Adenocarcinoma of endometrium [facility]) 08/23/2017    11:48 AM,12/21/18 Sherol Dade PT, DPT [facility] [address], [place], [place], [address] Phone: [phone]   Fax:  [phone]  Name: Tina Ray MRN: [id number] Date of  Birth: 09/07/1970

## 2018-12-21 NOTE — Patient Instructions (Signed)
Access Code: [email or web address]  URL: [email or web address]  Date: 12/21/2018  Prepared by: Sherol Dade   Exercises  Correct Seated Posture - 10 reps - 3 sets - 1x daily - 7x weekly  Standing Isometric Cervical Retraction with Chin Tucks and Ball at [employer] - 15 reps - 1 sets - 3 hold - 2x daily - 7x weekly  Squatting Shoulder Row with Anchored Resistance - 10 reps - 3 sets - 1x daily - 7x weekly  Seated Shoulder Horizontal Abduction with Resistance - Thumbs Up - 15 reps - 2 sets - 1x daily - 7x weekly  Shoulder Internal Rotation with Resistance - 10-15 reps - 1-2 sets - 1x daily - 7x weekly  Sidelying Open Book Thoracic Lumbar Rotation and Extension - 10 reps - 2x daily - 7x weekly  Sidelying Shoulder ER with Towel and Dumbbell - 8-10 reps - 1-2 sets - 1x daily - 7x weekly  Sidelying Shoulder Abduction Palm Forward - 10 reps - 2 sets - 1x daily - 7x weekly    [facility] [address], [place], [place] [address] Phone # [phone] Fax [phone]

## 2018-12-28 ENCOUNTER — Other Ambulatory Visit: Payer: Self-pay

## 2018-12-28 ENCOUNTER — Ambulatory Visit: Payer: Federal, State, Local not specified - PPO | Admitting: Physical Therapy

## 2018-12-28 ENCOUNTER — Encounter: Payer: Self-pay | Admitting: Physical Therapy

## 2018-12-28 DIAGNOSIS — M542 Cervicalgia: Secondary | ICD-10-CM

## 2018-12-28 DIAGNOSIS — R293 Abnormal posture: Secondary | ICD-10-CM

## 2018-12-28 DIAGNOSIS — M6281 Muscle weakness (generalized): Secondary | ICD-10-CM | POA: Diagnosis not present

## 2018-12-28 DIAGNOSIS — M79622 Pain in left upper arm: Secondary | ICD-10-CM

## 2018-12-28 DIAGNOSIS — M25511 Pain in right shoulder: Secondary | ICD-10-CM

## 2018-12-28 NOTE — Therapy (Signed)
[facility] [address], [place], [place], [address] Phone: [phone]   Fax:  [phone]  Physical Therapy Treatment  Patient Details  Name: Tina Ray MRN: [id number] Date of Birth: 08-09-1970 Referring Provider (PT): Orpah Melter, MD   Encounter Date: 12/28/2018  PT End of Session - 12/28/18 1123    Visit Number  17    Date for PT Re-Evaluation  01/23/19    Authorization Type  BCBS    PT Start Time  0819    PT Stop Time  0859    PT Time Calculation (min)  40 min    Activity Tolerance  Patient tolerated treatment well;No increased pain    Behavior During Therapy  WFL for tasks assessed/performed       Past Medical History:  Diagnosis Date  . Family history of breast cancer   . Family history of lung cancer   . Family history of melanoma   . Family history of prostate cancer   . Family history of throat cancer   . Hypertension   . IBS (irritable bowel syndrome)   . Melanoma of skin ([place]) 11/11/2017  . Peripheral vascular disease ([place])   . Skin cancer   . Skin cancer (melanoma) ([place])    1999    Past Surgical History:  Procedure Laterality Date  . BREAST BIOPSY Left 03/04/2016  . BREAST BIOPSY Left 03/06/2015  . DENTAL SURGERY     wisedom tooth extraction  . LYMPH NODE BIOPSY N/A 09/12/2017   Procedure: SENTINEL LYMPH NODE BIOPSY;  Surgeon: Everitt Amber, MD;  Location: [facility];  Service: Gynecology;  Laterality: N/A;  . ROBOTIC ASSISTED TOTAL HYSTERECTOMY WITH BILATERAL SALPINGO OOPHERECTOMY Bilateral 09/12/2017   Procedure: XI ROBOTIC ASSISTED TOTAL LAPAROSCOPIC  HYSTERECTOMY WITH BILATERAL SALPINGO OOPHORECTOMY;  Surgeon: Everitt Amber, MD;  Location: [facility];  Service: Gynecology;  Laterality: Bilateral;    There were no vitals filed for this visit.  Subjective Assessment - 12/28/18 0824    Subjective  Pt states her Rt shoulder was a little sore with attempting her sidelying exercises at home.  She has no pain currently. She is still having some pain at night in bilateral shoulders, but this is not every night.    Currently in Pain?  Yes    Pain Score  --   minimal Rt anterior shoulder pain   Pain Location  Shoulder    Pain Orientation  Right;Anterior                       [facility] Adult PT Treatment/Exercise - 12/28/18 0001      Neck Exercises: Standing   Other Standing Exercises  Self trigger point release levator scap against wall       Neck Exercises: Seated   Cervical Rotation  Left;10 reps    Cervical Rotation Limitations  x2 sets during manual treatment       Manual Therapy   Soft tissue mobilization  Trigger point release Rt teres/latissimus with passive Rt shoulder flexion x10 reps; Lt levator scap and upper trap trigger point release with active Lt cervical rotation 2x10 reps        Trigger Point Dry Needling - 12/28/18 0001    Consent Given?  Yes    Education Handout Provided  Previously provided    Muscles Treated Upper Quadrant  Latissimus dorsi;Teres major;Teres minor    Latissimus dorsi Response  Twitch response elicited;Palpable increased muscle length   Rt  Teres major Response  Twitch response elicited;Palpable increased muscle length   Rt   Teres minor Response  Twitch response elicited;Palpable increased muscle length   Rt          PT Education - 12/28/18 1123    Education Details  self massage    Person(s) Educated  Patient    Methods  Explanation;Verbal cues    Comprehension  Verbalized understanding;Returned demonstration       PT Short Term Goals - 12/05/18 0843      PT SHORT TERM GOAL #1   Title  pt will report centralizing of symptoms with no symptoms in Lt arm    Time  4    Period  Weeks    Status  Achieved    Target Date  11/10/18      PT SHORT TERM GOAL #2   Title  Pt will report 30% less pain for improved sleep    Baseline  75% improved    Time  4    Period  Weeks    Status  Achieved    Target Date   11/10/18        PT Long Term Goals - 12/05/18 0843      PT LONG TERM GOAL #1   Title  pt will be able to roll easily in bed without increased pain    Baseline  Pt has improved awareness but not consistent with this    Time  6    Period  Weeks    Status  Achieved      PT LONG TERM GOAL #2   Title  Pt will report 70% less pain at night for improved sleep    Baseline  75%    Time  6    Period  Weeks    Status  Achieved      PT LONG TERM GOAL #3   Title  Pt will demonstrate at least 60 degrees of cervical rotation bilaterally for ability to back out of parking spaces    Baseline  MET: Pt will demonstrate at least 50 degrees of cervical rotation  bilaterally for ability to back out of parking spaces    Time  6    Period  Weeks    Status  Revised      PT LONG TERM GOAL #4   Title  Pt will demonstrate improved UE strength to MMT 5/5 shoulder flexion and abduction bilaterally for ability to do household chores.    Baseline  4+/5 MMT with abduction, flexion and Rt shoulder IR/ER (+) pain on the Rt    Time  6    Period  Weeks    Status  Partially Met      PT LONG TERM GOAL #5   Title  pt will be ind with advanced HEP    Time  6    Period  Weeks    Status  New      Additional Long Term Goals   Additional Long Term Goals  Yes      PT LONG TERM GOAL #6   Title  Pt will report atleast 50% improvement in her pain and cervical ROM with driving so that she can check her blind spots without difficulty.    Time  6            Plan - 12/28/18 1124    Clinical Impression Statement  Pt has noticed improvements in her sleeping since making posture adjustments. She arrived with Rt shoulder  pain with elevation and at rest, and therapist completed trigger point release/STM to the posterior shoulder during today's session. Pt reported Rt shoulder pain resolved following this. She is noticing improvements in Lt cervical rotation, but still has a catching in the Lt upper trap region with  rapid rotation. This was resolved following trigger point release to the Lt levator scap. Pt was eager to continue with self soft tissue mobilization over the weekend.    Personal Factors and Comorbidities  Profession;Comorbidity 1    Comorbidities  chronic pain    Examination-Activity Limitations  Reach Overhead;Carry;Sleep;Other    Stability/Clinical Decision Making  Evolving/Moderate complexity    Rehab Potential  Excellent    PT Frequency  2x / week    PT Duration  8 weeks    PT Treatment/Interventions  ADLs/Self Care Home Management;Cryotherapy;Electrical Stimulation;Moist Heat;Traction;Therapeutic activities;Therapeutic exercise;Neuromuscular re-education;Manual techniques;Patient/family education;Passive range of motion;Dry needling;Taping    PT Next Visit Plan  f/u on self levator scap/posterior shoulder mobilization at home; lat stretch; progressing cervical rotation ROM/strength    PT Home Exercise Plan  Access Code: [email or web address]    Consulted and Agree with Plan of Care  Patient       Patient will benefit from skilled therapeutic intervention in order to improve the following deficits and impairments:  Pain, Increased fascial restricitons, Increased muscle spasms, Postural dysfunction, Decreased range of motion, Decreased strength  Visit Diagnosis: Muscle weakness (generalized)  Cervicalgia  Pain in left upper arm  Abnormal posture  Acute pain of right shoulder     Problem List Patient Active Problem List   Diagnosis Date Noted  . Genetic testing 12/06/2017  . Melanoma of skin ([place]) 11/11/2017  . Family history of prostate cancer   . Family history of breast cancer   . Family history of melanoma   . Family history of lung cancer   . Family history of throat cancer   . Adenocarcinoma of endometrium ([place]) 08/23/2017    11:30 AM,12/28/18 Sherol Dade PT, DPT [facility] [address] [place], [place], [address] Phone: [phone]   Fax:  [phone]  Name: SHAJUAN MUSSO MRN: [id number] Date of Birth: Jun 17, 1970

## 2019-01-03 ENCOUNTER — Other Ambulatory Visit: Payer: Self-pay

## 2019-01-03 ENCOUNTER — Ambulatory Visit: Payer: Federal, State, Local not specified - PPO | Admitting: Physical Therapy

## 2019-01-03 ENCOUNTER — Encounter: Payer: Self-pay | Admitting: Physical Therapy

## 2019-01-03 DIAGNOSIS — M25511 Pain in right shoulder: Secondary | ICD-10-CM | POA: Diagnosis not present

## 2019-01-03 DIAGNOSIS — Z23 Encounter for immunization: Secondary | ICD-10-CM | POA: Diagnosis not present

## 2019-01-03 DIAGNOSIS — M542 Cervicalgia: Secondary | ICD-10-CM

## 2019-01-03 DIAGNOSIS — R293 Abnormal posture: Secondary | ICD-10-CM

## 2019-01-03 DIAGNOSIS — M6281 Muscle weakness (generalized): Secondary | ICD-10-CM

## 2019-01-03 DIAGNOSIS — M79622 Pain in left upper arm: Secondary | ICD-10-CM | POA: Diagnosis not present

## 2019-01-03 NOTE — Therapy (Signed)
[facility] [address] [place], [place], [address] Phone: [phone]   Fax:  [phone]  Physical Therapy Treatment  Patient Details  Name: MIRKA BARBONE MRN: [id number] Date of Birth: 29-Mar-1971 Referring Provider (PT): Orpah Melter, MD   Encounter Date: 01/03/2019  PT End of Session - 01/03/19 0846    Visit Number  18    Date for PT Re-Evaluation  01/23/19    Authorization Type  BCBS    PT Start Time  0846    PT Stop Time  0931    PT Time Calculation (min)  45 min    Activity Tolerance  Patient tolerated treatment well;No increased pain    Behavior During Therapy  WFL for tasks assessed/performed       Past Medical History:  Diagnosis Date  . Family history of breast cancer   . Family history of lung cancer   . Family history of melanoma   . Family history of prostate cancer   . Family history of throat cancer   . Hypertension   . IBS (irritable bowel syndrome)   . Melanoma of skin ([place]) 11/11/2017  . Peripheral vascular disease ([place])   . Skin cancer   . Skin cancer (melanoma) ([place])    1999    Past Surgical History:  Procedure Laterality Date  . BREAST BIOPSY Left 03/04/2016  . BREAST BIOPSY Left 03/06/2015  . DENTAL SURGERY     wisedom tooth extraction  . LYMPH NODE BIOPSY N/A 09/12/2017   Procedure: SENTINEL LYMPH NODE BIOPSY;  Surgeon: Everitt Amber, MD;  Location: [facility];  Service: Gynecology;  Laterality: N/A;  . ROBOTIC ASSISTED TOTAL HYSTERECTOMY WITH BILATERAL SALPINGO OOPHERECTOMY Bilateral 09/12/2017   Procedure: XI ROBOTIC ASSISTED TOTAL LAPAROSCOPIC  HYSTERECTOMY WITH BILATERAL SALPINGO OOPHORECTOMY;  Surgeon: Everitt Amber, MD;  Location: [facility];  Service: Gynecology;  Laterality: Bilateral;    There were no vitals filed for this visit.  Subjective Assessment - 01/03/19 0847    Subjective  Exercises are going well. Pt pleased with progress. Needling really helped the traps.    Diagnostic tests  x-ray showing severe narrowing of C6-7 disc space    Currently in Pain?  No/denies    Multiple Pain Sites  No                       [facility] Adult PT Treatment/Exercise - 01/03/19 0001      Neck Exercises: Seated   Other Seated Exercise  Bil levator stretch 2x 20 sec   VC to lift the "scruff" of her neck and anchor op scap down     Shoulder Exercises: Standing   Other Standing Exercises  Lat, trap and levator release work with soft ball on wall Bil       Shoulder Exercises: Stretch   Other Shoulder Stretches  Standing lat stretch bil 20 sec   TC to inhibit UT     Manual Therapy   Soft tissue mobilization  Bil upper traps TP release, scalene soft tissue work,              PT Education - 01/03/19 0916    Education Details  PTA gave pt info on where to purchase myofascial balls of different levels of firmness in addition of good massage therapist.    Person(s) Educated  Patient    Methods  Handout    Comprehension  Verbalized understanding       PT Short  Term Goals - 12/05/18 0843      PT SHORT TERM GOAL #1   Title  pt will report centralizing of symptoms with no symptoms in Lt arm    Time  4    Period  Weeks    Status  Achieved    Target Date  11/10/18      PT SHORT TERM GOAL #2   Title  Pt will report 30% less pain for improved sleep    Baseline  75% improved    Time  4    Period  Weeks    Status  Achieved    Target Date  11/10/18        PT Long Term Goals - 12/05/18 0843      PT LONG TERM GOAL #1   Title  pt will be able to roll easily in bed without increased pain    Baseline  Pt has improved awareness but not consistent with this    Time  6    Period  Weeks    Status  Achieved      PT LONG TERM GOAL #2   Title  Pt will report 70% less pain at night for improved sleep    Baseline  75%    Time  6    Period  Weeks    Status  Achieved      PT LONG TERM GOAL #3   Title  Pt will demonstrate at least 60 degrees of  cervical rotation bilaterally for ability to back out of parking spaces    Baseline  MET: Pt will demonstrate at least 50 degrees of cervical rotation  bilaterally for ability to back out of parking spaces    Time  6    Period  Weeks    Status  Revised      PT LONG TERM GOAL #4   Title  Pt will demonstrate improved UE strength to MMT 5/5 shoulder flexion and abduction bilaterally for ability to do household chores.    Baseline  4+/5 MMT with abduction, flexion and Rt shoulder IR/ER (+) pain on the Rt    Time  6    Period  Weeks    Status  Partially Met      PT LONG TERM GOAL #5   Title  pt will be ind with advanced HEP    Time  6    Period  Weeks    Status  New      Additional Long Term Goals   Additional Long Term Goals  Yes      PT LONG TERM GOAL #6   Title  Pt will report atleast 50% improvement in her pain and cervical ROM with driving so that she can check her blind spots without difficulty.    Time  6            Plan - 01/03/19 0846    Clinical Impression Statement  Pt getting 4 hours of sleep now. Pt presents today without pain ( Rt shoulder doing better). Reviewed release work/self mobilization which pt did very well with. Added levator stretch to HEP. Worked on trigger point release in upper traps to end session.    Personal Factors and Comorbidities  Profession;Comorbidity 1    Comorbidities  chronic pain    Examination-Activity Limitations  Reach Overhead;Carry;Sleep;Other    Stability/Clinical Decision Making  Evolving/Moderate complexity    Rehab Potential  Excellent    PT Frequency  2x / week  PT Duration  8 weeks    PT Treatment/Interventions  ADLs/Self Care Home Management;Cryotherapy;Electrical Stimulation;Moist Heat;Traction;Therapeutic activities;Therapeutic exercise;Neuromuscular re-education;Manual techniques;Patient/family education;Passive range of motion;Dry needling;Taping    PT Next Visit Plan  Contiue with upper quarter soft tissue mobilization,  self and manual.    PT Home Exercise Plan  Access Code: [email or web address]    Consulted and Agree with Plan of Care  Patient       Patient will benefit from skilled therapeutic intervention in order to improve the following deficits and impairments:  Pain, Increased fascial restricitons, Increased muscle spasms, Postural dysfunction, Decreased range of motion, Decreased strength  Visit Diagnosis: Muscle weakness (generalized)  Cervicalgia  Pain in left upper arm  Abnormal posture  Acute pain of right shoulder     Problem List Patient Active Problem List   Diagnosis Date Noted  . Genetic testing 12/06/2017  . Melanoma of skin ([place]) 11/11/2017  . Family history of prostate cancer   . Family history of breast cancer   . Family history of melanoma   . Family history of lung cancer   . Family history of throat cancer   . Adenocarcinoma of endometrium ([place]) 08/23/2017    Meshilem Machuca, PTA 01/03/2019, 11:00 AM  [facility] [address], [place], [place], [address] Phone: [phone]   Fax:  [phone]  Name: JAHNE KRUKOWSKI MRN: [id number] Date of Birth: 11-16-1970  Access Code: [email or web address]  URL: [email or web address]  Date: 01/03/2019  Prepared by: Myrene Galas   Exercises  Correct Seated Posture - 10 reps - 3 sets - 1x daily - 7x weekly  Standing Isometric Cervical Retraction with Chin Tucks and Ball at [employer] - 15 reps - 1 sets - 3 hold - 2x daily - 7x weekly  Squatting Shoulder Row with Anchored Resistance - 10 reps - 3 sets - 1x daily - 7x weekly  Seated Shoulder Horizontal Abduction with Resistance - Thumbs Up - 15 reps - 2 sets - 1x daily - 7x weekly  Shoulder Internal Rotation with Resistance - 10-15 reps - 1-2 sets - 1x daily - 7x weekly  Sidelying Open Book Thoracic Lumbar Rotation and Extension - 10 reps - 2x daily - 7x weekly  Sidelying Shoulder ER with Towel and Dumbbell - 8-10 reps -  1-2 sets - 1x daily - 7x weekly  Sidelying Shoulder Abduction Palm Forward - 10 reps - 2 sets - 1x daily - 7x weekly  Seated Levator Scapulae Stretch - 2 reps - 1 sets - 20 hold - 2x daily - 7x weekly

## 2019-01-10 ENCOUNTER — Encounter: Payer: Federal, State, Local not specified - PPO | Admitting: Physical Therapy

## 2019-01-12 DIAGNOSIS — F4322 Adjustment disorder with anxiety: Secondary | ICD-10-CM | POA: Diagnosis not present

## 2019-01-12 DIAGNOSIS — F429 Obsessive-compulsive disorder, unspecified: Secondary | ICD-10-CM | POA: Diagnosis not present

## 2019-01-15 ENCOUNTER — Encounter: Payer: Self-pay | Admitting: Physical Therapy

## 2019-01-15 ENCOUNTER — Other Ambulatory Visit: Payer: Self-pay

## 2019-01-15 ENCOUNTER — Ambulatory Visit: Payer: Federal, State, Local not specified - PPO | Attending: Family Medicine | Admitting: Physical Therapy

## 2019-01-15 DIAGNOSIS — M542 Cervicalgia: Secondary | ICD-10-CM

## 2019-01-15 DIAGNOSIS — R293 Abnormal posture: Secondary | ICD-10-CM | POA: Diagnosis not present

## 2019-01-15 DIAGNOSIS — M79622 Pain in left upper arm: Secondary | ICD-10-CM | POA: Diagnosis not present

## 2019-01-15 DIAGNOSIS — M25511 Pain in right shoulder: Secondary | ICD-10-CM | POA: Diagnosis not present

## 2019-01-15 DIAGNOSIS — M6281 Muscle weakness (generalized): Secondary | ICD-10-CM

## 2019-01-15 NOTE — Therapy (Signed)
[facility] [address] [place], [place], [address] Phone: [phone]   Fax:  [phone]  Physical Therapy Treatment  Patient Details  Name: Tina Ray MRN: [id number] Date of Birth: 1970-06-11 Referring Provider (PT): Orpah Melter, MD   Encounter Date: 01/15/2019  PT End of Session - 01/15/19 1101    Visit Number  19    Date for PT Re-Evaluation  01/23/19    Authorization Type  BCBS    PT Start Time  1100    PT Stop Time  1142    PT Time Calculation (min)  42 min    Activity Tolerance  Patient tolerated treatment well    Behavior During Therapy  [facility] for tasks assessed/performed       Past Medical History:  Diagnosis Date  . Family history of breast cancer   . Family history of lung cancer   . Family history of melanoma   . Family history of prostate cancer   . Family history of throat cancer   . Hypertension   . IBS (irritable bowel syndrome)   . Melanoma of skin ([place]) 11/11/2017  . Peripheral vascular disease ([place])   . Skin cancer   . Skin cancer (melanoma) ([place])    1999    Past Surgical History:  Procedure Laterality Date  . BREAST BIOPSY Left 03/04/2016  . BREAST BIOPSY Left 03/06/2015  . DENTAL SURGERY     wisedom tooth extraction  . LYMPH NODE BIOPSY N/A 09/12/2017   Procedure: SENTINEL LYMPH NODE BIOPSY;  Surgeon: Everitt Amber, MD;  Location: [facility];  Service: Gynecology;  Laterality: N/A;  . ROBOTIC ASSISTED TOTAL HYSTERECTOMY WITH BILATERAL SALPINGO OOPHERECTOMY Bilateral 09/12/2017   Procedure: XI ROBOTIC ASSISTED TOTAL LAPAROSCOPIC  HYSTERECTOMY WITH BILATERAL SALPINGO OOPHORECTOMY;  Surgeon: Everitt Amber, MD;  Location: [facility];  Service: Gynecology;  Laterality: Bilateral;    There were no vitals filed for this visit.  Subjective Assessment - 01/15/19 1102    Subjective  For the last few days I have had muscles spasms just below my rib cage in my back. Feels tight. Does not have  it now, only this AM when I woke up.    Currently in Pain?  No/denies    Multiple Pain Sites  No                       [facility] Adult PT Treatment/Exercise - 01/15/19 0001      Lumbar Exercises: Supine   Other Supine Lumbar Exercises  Vertical on foam roll with breath directed lateral rib cage 10x, front and back ribs 10x, VC for eyes closed : rocking 10x ; thoracic extensions over roll 10x, side to side rock for myofascial gliding;     Other Supine Lumbar Exercises  Open book sidelying stretch 10x bil       Shoulder Exercises: ROM/Strengthening   UBE (Upper Arm Bike)  L 2.5 $x 4 with discussion of  current status.       Manual Therapy   Soft tissue mobilization  Bil upper traps TP release, scalene soft tissue work,                PT Short Term Goals - 12/05/18 0843      PT SHORT TERM GOAL #1   Title  pt will report centralizing of symptoms with no symptoms in Lt arm    Time  4    Period  Weeks  Status  Achieved    Target Date  11/10/18      PT SHORT TERM GOAL #2   Title  Pt will report 30% less pain for improved sleep    Baseline  75% improved    Time  4    Period  Weeks    Status  Achieved    Target Date  11/10/18        PT Long Term Goals - 12/05/18 0843      PT LONG TERM GOAL #1   Title  pt will be able to roll easily in bed without increased pain    Baseline  Pt has improved awareness but not consistent with this    Time  6    Period  Weeks    Status  Achieved      PT LONG TERM GOAL #2   Title  Pt will report 70% less pain at night for improved sleep    Baseline  75%    Time  6    Period  Weeks    Status  Achieved      PT LONG TERM GOAL #3   Title  Pt will demonstrate at least 60 degrees of cervical rotation bilaterally for ability to back out of parking spaces    Baseline  MET: Pt will demonstrate at least 50 degrees of cervical rotation  bilaterally for ability to back out of parking spaces    Time  6    Period  Weeks    Status   Revised      PT LONG TERM GOAL #4   Title  Pt will demonstrate improved UE strength to MMT 5/5 shoulder flexion and abduction bilaterally for ability to do household chores.    Baseline  4+/5 MMT with abduction, flexion and Rt shoulder IR/ER (+) pain on the Rt    Time  6    Period  Weeks    Status  Partially Met      PT LONG TERM GOAL #5   Title  pt will be ind with advanced HEP    Time  6    Period  Weeks    Status  New      Additional Long Term Goals   Additional Long Term Goals  Yes      PT LONG TERM GOAL #6   Title  Pt will report atleast 50% improvement in her pain and cervical ROM with driving so that she can check her blind spots without difficulty.    Time  6            Plan - 01/15/19 1116    Clinical Impression Statement  Pt had to cancel her last appt secondary to last minute out of town trip, so it has been 2 weeks since we last saw her. Overall she is doing well, per her report, but she does have this new complaint of a 'tighness" just below her bra strap. She feels strongly it is soft tissue. We spent most of the treatment time reviewing past exercises and learning new ways to use her foam roll at home to mobilize her thoracic spine.    Personal Factors and Comorbidities  Profession;Comorbidity 1    Comorbidities  chronic pain    Examination-Activity Limitations  Reach Overhead;Carry;Sleep;Other    Stability/Clinical Decision Making  Evolving/Moderate complexity    Rehab Potential  Excellent    PT Frequency  2x / week    PT Duration  8 weeks  PT Treatment/Interventions  ADLs/Self Care Home Management;Cryotherapy;Electrical Stimulation;Moist Heat;Traction;Therapeutic activities;Therapeutic exercise;Neuromuscular re-education;Manual techniques;Patient/family education;Passive range of motion;Dry needling;Taping    PT Next Visit Plan  Assess for potential DC next session.    PT Home Exercise Plan  Access Code: [email or web address]    Consulted and Agree with Plan of Care   Patient       Patient will benefit from skilled therapeutic intervention in order to improve the following deficits and impairments:  Pain, Increased fascial restricitons, Increased muscle spasms, Postural dysfunction, Decreased range of motion, Decreased strength  Visit Diagnosis: Muscle weakness (generalized)  Cervicalgia  Pain in left upper arm  Abnormal posture  Acute pain of right shoulder     Problem List Patient Active Problem List   Diagnosis Date Noted  . Genetic testing 12/06/2017  . Melanoma of skin ([place]) 11/11/2017  . Family history of prostate cancer   . Family history of breast cancer   . Family history of melanoma   . Family history of lung cancer   . Family history of throat cancer   . Adenocarcinoma of endometrium ([place]) 08/23/2017    Tina Ray, PTA 01/15/2019, 11:44 AM  [facility] [address], [place], [place], [address] Phone: [phone]   Fax:  [phone]  Name: Tina Ray MRN: [id number] Date of Birth: 03/10/1971

## 2019-01-17 ENCOUNTER — Ambulatory Visit: Payer: Federal, State, Local not specified - PPO | Admitting: Physical Therapy

## 2019-01-23 ENCOUNTER — Encounter: Payer: Self-pay | Admitting: Physical Therapy

## 2019-01-23 ENCOUNTER — Ambulatory Visit: Payer: Federal, State, Local not specified - PPO | Admitting: Physical Therapy

## 2019-01-23 ENCOUNTER — Other Ambulatory Visit: Payer: Self-pay

## 2019-01-23 DIAGNOSIS — M25511 Pain in right shoulder: Secondary | ICD-10-CM

## 2019-01-23 DIAGNOSIS — M79622 Pain in left upper arm: Secondary | ICD-10-CM

## 2019-01-23 DIAGNOSIS — R293 Abnormal posture: Secondary | ICD-10-CM | POA: Diagnosis not present

## 2019-01-23 DIAGNOSIS — M542 Cervicalgia: Secondary | ICD-10-CM

## 2019-01-23 DIAGNOSIS — M6281 Muscle weakness (generalized): Secondary | ICD-10-CM | POA: Diagnosis not present

## 2019-01-23 NOTE — Therapy (Signed)
[facility] [address], [place], [place], [address] Phone: [phone]   Fax:  [phone]  Physical Therapy Treatment/Discharge  Patient Details  Name: Tina Ray MRN: [id number] Date of Birth: 08-29-1970 Referring Provider (PT): Orpah Melter, MD   Encounter Date: 01/23/2019  PT End of Session - 01/23/19 0935    Visit Number  20    Date for PT Re-Evaluation  01/23/19    Authorization Type  BCBS    PT Start Time  0848    PT Stop Time  0930    PT Time Calculation (min)  42 min    Activity Tolerance  Patient tolerated treatment well    Behavior During Therapy  [facility] for tasks assessed/performed       Past Medical History:  Diagnosis Date  . Family history of breast cancer   . Family history of lung cancer   . Family history of melanoma   . Family history of prostate cancer   . Family history of throat cancer   . Hypertension   . IBS (irritable bowel syndrome)   . Melanoma of skin ([place]) 11/11/2017  . Peripheral vascular disease ([place])   . Skin cancer   . Skin cancer (melanoma) ([place])    1999    Past Surgical History:  Procedure Laterality Date  . BREAST BIOPSY Left 03/04/2016  . BREAST BIOPSY Left 03/06/2015  . DENTAL SURGERY     wisedom tooth extraction  . LYMPH NODE BIOPSY N/A 09/12/2017   Procedure: SENTINEL LYMPH NODE BIOPSY;  Surgeon: Everitt Amber, MD;  Location: [facility];  Service: Gynecology;  Laterality: N/A;  . ROBOTIC ASSISTED TOTAL HYSTERECTOMY WITH BILATERAL SALPINGO OOPHERECTOMY Bilateral 09/12/2017   Procedure: XI ROBOTIC ASSISTED TOTAL LAPAROSCOPIC  HYSTERECTOMY WITH BILATERAL SALPINGO OOPHORECTOMY;  Surgeon: Everitt Amber, MD;  Location: [facility];  Service: Gynecology;  Laterality: Bilateral;    There were no vitals filed for this visit.  Subjective Assessment - 01/23/19 0851    Subjective  Pt reports that things are going great. She has not had her tightness below the bra line since her  last appointment. She has noticed her original pain/burning coming down the Lt shoulder since Saturday. Rt arm is doing much better.    Currently in Pain?  Yes    Pain Score  4     Pain Location  Shoulder    Pain Orientation  Left    Pain Descriptors / Indicators  Burning    Pain Type  Chronic pain    Pain Radiating Towards  down upper arm    Pain Onset  In the past 7 days    Pain Frequency  Intermittent    Aggravating Factors   sitting at her desk, sleeping    Pain Relieving Factors  HEP         [facility] PT Assessment - 01/23/19 0001      Assessment   Medical Diagnosis  M54.2 (ICD-10-CM) - Cervicalgia    Referring Provider (PT)  Orpah Melter, MD    Hand Dominance  Right    Prior Therapy  No      Precautions   Precautions  None      Restrictions   Weight Bearing Restrictions  No      Balance Screen   Has the patient fallen in the past 6 months  No    Has the patient had a decrease in activity level because of a fear of falling?   No  Is the patient reluctant to leave their home because of a fear of falling?   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    Leisure  cooking, walking      Cognition   Overall Cognitive Status  Within Functional Limits for tasks assessed      Observation/Other Assessments   Focus on Therapeutic Outcomes (FOTO)   32% limited       Sensation   Additional Comments  denies numbness/tingling      Posture/Postural Control   Posture/Postural Control  No significant limitations    Postural Limitations  --    Posture Comments  pt demonstrates significant improvements in seated posture throughout session       AROM   Overall AROM Comments  shoulder active ROM full and pain free     Cervical Flexion  30    Cervical Extension  30    Cervical -  Right Side Bend  25   pain free    Cervical - Left Side Bend  25   feels pulling on upper Lt back   Cervical - Right Rotation  55   pain free    Cervical - Left Rotation  55   pain free      Strength   Overall Strength Comments  --    Right Shoulder Flexion  5/5    Right Shoulder ABduction  5/5   pain free    Right Shoulder Internal Rotation  5/5   pain free    Right Shoulder External Rotation  5/5    Right Shoulder Horizontal ABduction  5/5   pain free    Right Shoulder Horizontal ADduction  5/5    Left Shoulder Flexion  5/5    Left Shoulder ABduction  5/5   pain free   Left Shoulder Internal Rotation  5/5    Left Shoulder External Rotation  5/5    Left Shoulder Horizontal ABduction  5/5    Left Shoulder Horizontal ADduction  5/5    Right Hand Grip (lbs)  50    Left Hand Grip (lbs)  45      Palpation   Palpation comment  tightness Lt upper trap       Ambulation/Gait   Gait Pattern  Within Functional Limits                   [facility] Adult PT Treatment/Exercise - 01/23/19 0001      Neck Exercises: Standing   Neck Retraction  10 reps    Neck Retraction Limitations  1 set towel against wall, 1 set without tactile cuing agaist wall       Manual Therapy   Soft tissue mobilization  Trigger point release Lt upper trap       Neck Exercises: Stretches   Other Neck Stretches   Lt scalene stretch 3x15 sec        Trigger Point Dry Needling - 01/23/19 0001    Consent Given?  Yes    Education Handout Provided  Previously provided    Upper Trapezius Response  Twitch reponse elicited;Palpable increased muscle length           PT Education - 01/23/19 0941    Education Details  finalized  HEP; d/c and progress towards goals    Person(s) Educated  Patient    Methods  Explanation;Handout    Comprehension  Verbalized understanding       PT Short Term Goals - 12/05/18 0843      PT SHORT TERM GOAL #1   Title  pt will report centralizing of symptoms with no  symptoms in Lt arm    Time  4    Period  Weeks    Status  Achieved    Target Date  11/10/18      PT SHORT TERM GOAL #2   Title  Pt will report 30% less pain for improved sleep    Baseline  75% improved    Time  4    Period  Weeks    Status  Achieved    Target Date  11/10/18        PT Long Term Goals - 01/23/19 0926      PT LONG TERM GOAL #1   Title  pt will be able to roll easily in bed without increased pain    Baseline  Pt has improved awareness but not consistent with this    Time  6    Period  Weeks    Status  Achieved      PT LONG TERM GOAL #2   Title  Pt will report 70% less pain at night for improved sleep    Baseline  75%    Time  6    Period  Weeks    Status  Achieved      PT LONG TERM GOAL #3   Title  Pt will demonstrate at least 60 degrees of cervical rotation bilaterally for ability to back out of parking spaces    Baseline  MET: Pt will demonstrate at least 60 degrees of cervical rotation Rt and 55 Lt    Time  6    Period  Weeks    Status  Partially Met      PT LONG TERM GOAL #4   Title  Pt will demonstrate improved UE strength to MMT 5/5 shoulder flexion and abduction bilaterally for ability to do household chores.    Baseline  5/5 MMT no pain    Time  6    Period  Weeks    Status  Partially Met      PT LONG TERM GOAL #5   Title  pt will be ind with advanced HEP    Time  6    Period  Weeks    Status  Achieved      PT LONG TERM GOAL #6   Title  Pt will report atleast 50% improvement in her pain and cervical ROM with driving so that she can check her blind spots without difficulty.    Time  6    Status  Achieved            Plan - 01/23/19 0935    Clinical Impression Statement  Pt was discharged this visit having met all long-term goals since starting PT. She has full and pain free shoulder strength today. Her cervical active ROM has increased to 60 deg rotation to the Rt and atleast 55 deg to the Lt. Pt denies pain with this as well. Pt  did note recent new onset of Lt shoulder burning throughout the day, but this has improved with her HEP and decreased to 1/10 by the end of today's session. She has been consistently completing her HEP without  issue and this was finalized today. Pt is pleased with her progress and is agreeable with d/c.    Personal Factors and Comorbidities  Profession;Comorbidity 1    Comorbidities  chronic pain    Examination-Activity Limitations  Reach Overhead;Carry;Sleep;Other    Stability/Clinical Decision Making  Evolving/Moderate complexity    Rehab Potential  Excellent    PT Frequency  2x / week    PT Duration  8 weeks    PT Treatment/Interventions  ADLs/Self Care Home Management;Cryotherapy;Electrical Stimulation;Moist Heat;Traction;Therapeutic activities;Therapeutic exercise;Neuromuscular re-education;Manual techniques;Patient/family education;Passive range of motion;Dry needling;Taping    PT Next Visit Plan  d/c    PT Home Exercise Plan  Access Code: [email or web address]    Consulted and Agree with Plan of Care  Patient       Patient will benefit from skilled therapeutic intervention in order to improve the following deficits and impairments:  Pain, Increased fascial restricitons, Increased muscle spasms, Postural dysfunction, Decreased range of motion, Decreased strength  Visit Diagnosis: Muscle weakness (generalized)  Cervicalgia  Pain in left upper arm  Abnormal posture  Acute pain of right shoulder     Problem List Patient Active Problem List   Diagnosis Date Noted  . Genetic testing 12/06/2017  . Melanoma of skin ([place]) 11/11/2017  . Family history of prostate cancer   . Family history of breast cancer   . Family history of melanoma   . Family history of lung cancer   . Family history of throat cancer   . Adenocarcinoma of endometrium ([place]) 08/23/2017   PHYSICAL THERAPY DISCHARGE SUMMARY  Visits from Start of Care: 20  Current functional level related to goals / functional  outcomes: See above for more details    Remaining deficits: See above for more details    Education / Equipment: See above for more details   Plan: Patient agrees to discharge.  Patient goals were met. Patient is being discharged due to meeting the stated rehab goals.  ?????         9:50 AM,01/23/19 Sherol Dade PT, DPT [place] [facility] [place]   [facility] [address], [place], [place], [address] Phone: [phone]   Fax:  [phone]  Name: ERENDIRA CRABTREE MRN: [id number] Date of Birth: 01-01-1971

## 2019-01-23 NOTE — Patient Instructions (Signed)
Access Code: [email or web address]  URL: [email or web address]  Date: 01/23/2019  Prepared by: Sherol Dade   Exercises  Correct Seated Posture - 10 reps - 3 sets - 1x daily - 7x weekly  Standing Isometric Cervical Retraction with Chin Tucks and Ball at [employer] - 15 reps - 1 sets - 3 hold - 2x daily - 4x weekly  Squatting Shoulder Row with Anchored Resistance - 10 reps - 3 sets - 1x daily - 4x weekly  Seated Shoulder Horizontal Abduction with Resistance - Thumbs Up - 15 reps - 2 sets - 1x daily - 4x weekly  Shoulder Internal Rotation with Resistance - 10-15 reps - 1-2 sets - 1x daily - 4x weekly  Sidelying Open Book Thoracic Lumbar Rotation and Extension - 10 reps - 2x daily - 7x weekly  Sidelying Shoulder ER with Towel and Dumbbell - 8-10 reps - 1-2 sets - 1x daily - 4x weekly  Seated Assisted Cervical Rotation with Towel - 10 reps - 3 sets - 1x daily - 4x weekly    [facility] [address] [place], [place] [address] Phone # [phone] Fax [phone]

## 2019-02-27 DIAGNOSIS — F429 Obsessive-compulsive disorder, unspecified: Secondary | ICD-10-CM | POA: Diagnosis not present

## 2019-02-27 DIAGNOSIS — F4322 Adjustment disorder with anxiety: Secondary | ICD-10-CM | POA: Diagnosis not present

## 2019-03-15 ENCOUNTER — Other Ambulatory Visit: Payer: Self-pay | Admitting: Gynecologic Oncology

## 2019-03-15 DIAGNOSIS — Z1231 Encounter for screening mammogram for malignant neoplasm of breast: Secondary | ICD-10-CM

## 2019-03-19 ENCOUNTER — Other Ambulatory Visit: Payer: Self-pay | Admitting: Obstetrics and Gynecology

## 2019-03-19 DIAGNOSIS — N644 Mastodynia: Secondary | ICD-10-CM

## 2019-04-05 DIAGNOSIS — L814 Other melanin hyperpigmentation: Secondary | ICD-10-CM | POA: Diagnosis not present

## 2019-04-05 DIAGNOSIS — L821 Other seborrheic keratosis: Secondary | ICD-10-CM | POA: Diagnosis not present

## 2019-04-05 DIAGNOSIS — Z85820 Personal history of malignant melanoma of skin: Secondary | ICD-10-CM | POA: Diagnosis not present

## 2019-04-05 DIAGNOSIS — D225 Melanocytic nevi of trunk: Secondary | ICD-10-CM | POA: Diagnosis not present

## 2019-04-10 DIAGNOSIS — F4322 Adjustment disorder with anxiety: Secondary | ICD-10-CM | POA: Diagnosis not present

## 2019-04-10 DIAGNOSIS — F429 Obsessive-compulsive disorder, unspecified: Secondary | ICD-10-CM | POA: Diagnosis not present

## 2019-04-13 ENCOUNTER — Other Ambulatory Visit: Payer: Federal, State, Local not specified - PPO

## 2019-04-19 ENCOUNTER — Inpatient Hospital Stay: Payer: Federal, State, Local not specified - PPO | Admitting: Gynecologic Oncology

## 2019-04-20 DIAGNOSIS — Z1211 Encounter for screening for malignant neoplasm of colon: Secondary | ICD-10-CM | POA: Diagnosis not present

## 2019-04-20 DIAGNOSIS — J069 Acute upper respiratory infection, unspecified: Secondary | ICD-10-CM | POA: Diagnosis not present

## 2019-04-23 DIAGNOSIS — K080 Exfoliation of teeth due to systemic causes: Secondary | ICD-10-CM | POA: Diagnosis not present

## 2019-04-24 ENCOUNTER — Other Ambulatory Visit: Payer: Self-pay | Admitting: Obstetrics and Gynecology

## 2019-04-24 DIAGNOSIS — R922 Inconclusive mammogram: Secondary | ICD-10-CM

## 2019-04-24 DIAGNOSIS — N644 Mastodynia: Secondary | ICD-10-CM

## 2019-04-26 ENCOUNTER — Other Ambulatory Visit: Payer: Self-pay

## 2019-04-26 ENCOUNTER — Ambulatory Visit: Payer: Federal, State, Local not specified - PPO

## 2019-04-26 ENCOUNTER — Ambulatory Visit
Admission: RE | Admit: 2019-04-26 | Discharge: 2019-04-26 | Disposition: A | Discharge status: Home / Self Care | Payer: Federal, State, Local not specified - PPO | Source: Ambulatory Visit | Attending: Obstetrics and Gynecology | Admitting: Obstetrics and Gynecology

## 2019-04-26 DIAGNOSIS — N644 Mastodynia: Secondary | ICD-10-CM

## 2019-04-26 DIAGNOSIS — R922 Inconclusive mammogram: Secondary | ICD-10-CM

## 2019-05-09 DIAGNOSIS — F429 Obsessive-compulsive disorder, unspecified: Secondary | ICD-10-CM | POA: Diagnosis not present

## 2019-05-11 ENCOUNTER — Inpatient Hospital Stay: Payer: Federal, State, Local not specified - PPO | Attending: Gynecologic Oncology | Admitting: Gynecologic Oncology

## 2019-05-11 ENCOUNTER — Other Ambulatory Visit: Payer: Self-pay

## 2019-05-11 ENCOUNTER — Encounter: Payer: Self-pay | Admitting: Gynecologic Oncology

## 2019-05-11 VITALS — BP 148/98 | HR 107 | Temp 98.0°F | Resp 18 | Ht 62.5 in | Wt 232.2 lb

## 2019-05-11 DIAGNOSIS — Z8042 Family history of malignant neoplasm of prostate: Secondary | ICD-10-CM | POA: Insufficient documentation

## 2019-05-11 DIAGNOSIS — N951 Menopausal and female climacteric states: Secondary | ICD-10-CM | POA: Insufficient documentation

## 2019-05-11 DIAGNOSIS — I739 Peripheral vascular disease, unspecified: Secondary | ICD-10-CM | POA: Insufficient documentation

## 2019-05-11 DIAGNOSIS — Z85820 Personal history of malignant melanoma of skin: Secondary | ICD-10-CM | POA: Diagnosis not present

## 2019-05-11 DIAGNOSIS — Z90710 Acquired absence of both cervix and uterus: Secondary | ICD-10-CM

## 2019-05-11 DIAGNOSIS — I10 Essential (primary) hypertension: Secondary | ICD-10-CM | POA: Diagnosis not present

## 2019-05-11 DIAGNOSIS — E663 Overweight: Secondary | ICD-10-CM | POA: Insufficient documentation

## 2019-05-11 DIAGNOSIS — Z79899 Other long term (current) drug therapy: Secondary | ICD-10-CM | POA: Diagnosis not present

## 2019-05-11 DIAGNOSIS — Z801 Family history of malignant neoplasm of trachea, bronchus and lung: Secondary | ICD-10-CM | POA: Insufficient documentation

## 2019-05-11 DIAGNOSIS — Z803 Family history of malignant neoplasm of breast: Secondary | ICD-10-CM | POA: Diagnosis not present

## 2019-05-11 DIAGNOSIS — Z90722 Acquired absence of ovaries, bilateral: Secondary | ICD-10-CM | POA: Diagnosis not present

## 2019-05-11 DIAGNOSIS — C541 Malignant neoplasm of endometrium: Secondary | ICD-10-CM | POA: Diagnosis not present

## 2019-05-11 MED ORDER — VENLAFAXINE HCL ER 75 MG PO CP24: 75.0000 mg | ORAL_CAPSULE | Freq: Every day | ORAL | 11 refills | Status: DC

## 2019-05-11 NOTE — Progress Notes (Signed)
Follow-up Note: Gyn-Onc  Consult was initially requested by Dr. Nile Dear for the evaluation of Tina Ray 49 y.o. female  CC:  Chief Complaint  Patient presents with  . Adenocarcinoma of endometrium [facility])    Assessment/Plan:  Ms. Tina Ray  is a 49 y.o.  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  Pathology revealed low risk factors for recurrence, therefore no adjuvant therapy is recommended according to NCCN guidelines.  Hereditary BARD1 mutation of uncertain significance.   Vasomotor symptoms of menopause - did not like weight gain on Paxil. Will try venlafaxine. Counseled regarding anticipated side effects and risk of abrupt stopping.  I discussed risk for recurrence and typical symptoms encouraged her to notify us of these should they develop between visits.  I recommend she have follow-up every 6 months for 5 years in accordance with NCCN guidelines. Those visits should include symptom assessment, physical exam and pelvic examination. Pap smears are not indicated or recommended in the routine surveillance of endometrial cancer.  She will return to see me in January/February, 2021, and Dr Melba Coon annually in June.   HPI: Tina Ray was a 49 year old P0 who was seen in consultation at the request of Dr Melba Coon for grade 1 endometrial cancer.   The patient has a history of previously normal regular menstrual cycles up until December 2018 when she began experiencing intermenstrual bleeding.  She then discussed this with her OB/GYN during in May 2019 routine visit.  This prompted a transvaginal ultrasound scan which identified a 1 cm endometrial polyp.  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  The patient is nulliparous as her husband had had a prior  vasectomy.  Patient has a history of 2 prior melanoma surgeries including one in her 54s.  Her family history is significant for a paternal grandmother with breast cancer at age 73, father with prostate cancer, and a maternal grandfather and 3 additional people on her mother side of the family with melanoma history.  The patient has had no prior abdominal surgeries.  She has hypertension but no diabetes mellitus.  She is overweight and weighs 204 pounds.  On 09/12/17 she underwent robotic assisted total hysterectomy, BSO, SLN biopsy. Final pathology revealed a grade 2 endometrioid (no serous features identified on final pathology), non-invasive lesion measuring 0.8cm. There was no LVSI. SLN's, cervix and adnexa were negative. IHC normal for MMR and PCR showed MSI normal.  She was determined to have low risk disease for recurrence, and in accordance with NCCN guidelines, no adjuvant therapy was recommended. Given her young age at diagnosis and prior melanoma history, she qualified for genetics evaluation. Genetics assessment at [facility] was positive for a hereditary deleterious mutation in BARD1 of uncertain significance. She was not recommended additional surveillance for this.   She has done well since our last visit, however her father died at Thanksgiving, 9767 from complications associated with COPD.     Interval Hx:  She was prescribed Paxil for symptoms of vasomotor spasm AKA hot flashes.  She did not notice a particular change in her for hot flashes and did notice an increase in weight and therefore stopped this.  She expressed interest to me in trying a different agent that was nonhormonal.  Current Meds:  Outpatient Encounter Medications as of 05/11/2019  Medication Sig  . acyclovir (ZOVIRAX) 400 MG tablet Take 400 mg by mouth twice daily as needed for fever blisters  . fluticasone (FLONASE) 50 MCG/ACT nasal spray Use 1 spray in each nostril once daily at night  .  lisinopril-hydrochlorothiazide (PRINZIDE,ZESTORETIC) 20-25 MG tablet Take 1 tablet by mouth daily.  . Menthol, Topical Analgesic, (BIOFREEZE EX) Apply 1 application topically 2 (two) times daily as needed (back pain).  . cyclobenzaprine (FLEXERIL) 10 MG tablet Take 10 mg by mouth 3 (three) times daily as needed for muscle spasms.  Marland Kitchen ibuprofen (ADVIL,MOTRIN) 200 MG tablet Take 400 mg by mouth 2 (two) times daily as needed for moderate pain.  Marland Kitchen venlafaxine XR (EFFEXOR XR) 75 MG 24 hr capsule Take 1 capsule (75 mg total) by mouth daily with breakfast.  . [DISCONTINUED] PARoxetine (PAXIL) 10 MG tablet paroxetine 10 mg tablet  TAKE 1 TABLET BY MOUTH EVERY DAY   No facility-administered encounter medications on file as of 05/11/2019.    Allergy:  Allergies  Allergen Reactions  . Erythromycin Base Hives  . Sulfa Antibiotics Hives    Social Hx:   Social History   Socioeconomic History  . Marital status: Married    Spouse name: Not on file  . Number of children: Not on file  . Years of education: Not on file  . Highest education level: Not on file  Occupational History  . Not on file  Tobacco Use  . Smoking status: Never Smoker  . Smokeless tobacco: Never Used  Substance and Sexual Activity  . Alcohol use: Yes    Alcohol/week: 3.0 standard drinks    Types: 3 Glasses of wine per week    Comment: 2 or 3 classes of wine daily  . Drug use: Never  . Sexual activity: Yes    Birth control/protection: Surgical  Other Topics Concern  . Not on file  Social History Narrative  . Not on file   Social Determinants of Health   Financial Resource Strain:   . Difficulty of Paying Living Expenses: Not on file  Food Insecurity:   . Worried About Charity fundraiser in the Last Year: Not on file  . Ran Out of Food in the Last Year: Not on file  Transportation Needs:   . Lack of Transportation (Medical): Not on file  . Lack of Transportation (Non-Medical): Not on file  Physical Activity:   .  Days of Exercise per Week: Not on file  . Minutes of Exercise per Session: Not on file  Stress:   . Feeling of Stress : Not on file  Social Connections:   . Frequency of Communication with Friends and Family: Not on file  . Frequency of Social Gatherings with Friends and Family: Not on file  . Attends Religious Services: Not on file  . Active Member of Clubs or Organizations: Not on file  . Attends Archivist Meetings: Not on file  . Marital Status: Not on file  Intimate Partner Violence:   . Fear of Current or Ex-Partner: Not on file  . Emotionally Abused: Not on file  . Physically Abused: Not on file  . Sexually Abused: Not on file    Past Surgical Hx:  Past Surgical History:  Procedure Laterality Date  . BREAST BIOPSY Left 03/04/2016  . BREAST BIOPSY Left 03/06/2015  . DENTAL SURGERY     wisedom tooth extraction  . LYMPH NODE BIOPSY N/A 09/12/2017   Procedure: SENTINEL LYMPH NODE BIOPSY;  Surgeon: Everitt Amber,  MD;  Location: [facility];  Service: Gynecology;  Laterality: N/A;  . ROBOTIC ASSISTED TOTAL HYSTERECTOMY WITH BILATERAL SALPINGO OOPHERECTOMY Bilateral 09/12/2017   Procedure: XI ROBOTIC ASSISTED TOTAL LAPAROSCOPIC  HYSTERECTOMY WITH BILATERAL SALPINGO OOPHORECTOMY;  Surgeon: Everitt Amber, MD;  Location: [facility];  Service: Gynecology;  Laterality: Bilateral;    Past Medical Hx:  Past Medical History:  Diagnosis Date  . Family history of breast cancer   . Family history of lung cancer   . Family history of melanoma   . Family history of prostate cancer   . Family history of throat cancer   . Hypertension   . IBS (irritable bowel syndrome)   . Melanoma of skin ([place]) 11/11/2017  . Peripheral vascular disease ([place])   . Skin cancer   . Skin cancer (melanoma) ([place])    1999    Past Gynecological History:   Patient's last menstrual period was 07/04/2017 (exact date).  Family Hx:  Family History  Problem Relation Age of Onset  . Breast cancer Paternal Grandmother  76       dx at very advanced stage.  . Hypercholesterolemia Mother   . Colon polyps Mother        a few every time, goes every 3 years  . Endometriosis Mother   . Diabetes Father   . Multiple sclerosis Father   . Prostate cancer Father 59       metastatic to bone  . Colon polyps Father   . Diabetes Maternal Aunt   . Melanoma Maternal Aunt 10  . Diabetes Maternal Uncle   . Lung cancer Maternal Uncle   . Throat cancer Maternal Uncle   . Hypertension Maternal Grandmother   . Diabetes Maternal Grandmother   . Diabetes Maternal Grandfather   . Melanoma Maternal Grandfather 82  . Heart attack Maternal Grandfather   . Breast cancer Other        dx >50  . Breast cancer Other        dx >50    Review of Systems:  Constitutional  Feels well,    ENT Normal appearing ears and nares bilaterally Skin/Breast  No rash, sores, jaundice, itching, dryness Cardiovascular  No chest pain, shortness of breath, or edema  Pulmonary  No cough or wheeze.  Gastro Intestinal  No nausea, vomitting, or diarrhoea. No bright red blood per rectum, no abdominal pain, change in bowel movement, or constipation.  Genito Urinary  No frequency, urgency, dysuria, no abnormal uterine bleeding Musculo Skeletal  No myalgia, arthralgia, joint swelling or pain  Neurologic  No weakness, numbness, change in gait,  Psychology  No depression, anxiety, insomnia.   Vitals:  Blood pressure (!) 148/98, pulse (!) 107, temperature 98 F (36.7 C), temperature source Temporal, resp. rate 18, height 5' 2.5" (1.588 m), weight 232 lb 3.2 oz (105.3 kg), last menstrual period 07/04/2017, SpO2 100 %.  Physical Exam: WD in NAD Neck  Supple NROM, without any enlargements.  Lymph Node Survey No cervical supraclavicular or inguinal adenopathy Cardiovascular  Pulse normal rate, regularity and rhythm. S1 and S2 normal.  Lungs  Clear to auscultation bilateraly, without wheezes/crackles/rhonchi. Good air movement.  Skin  No  rash/lesions/breakdown  Psychiatry  Alert and oriented to person, place, and time  Abdomen  Normoactive bowel sounds, abdomen soft, non-tender and overweight without evidence of hernia.  Back No CVA tenderness Genito Urinary  Vulva/vagina: Normal external female genitalia.   No lesions. No discharge or bleeding.  Bladder/urethra:  No lesions or masses,  well supported bladder  Vagina: normal  Cervix: Normal appearing, no lesions.  Uterus:  Small, mobile, no parametrial involvement or nodularity.  Adnexa: no palpable masses. Rectal  deferred Extremities  No bilateral cyanosis, clubbing or edema.  Thereasa Solo, MD  05/11/2019, 5:46 PM

## 2019-05-11 NOTE — Patient Instructions (Signed)
Dr Denman George has prescribed effexor to take once a day for hot flashes. Do not stop this abruptly.  Please notify Dr Denman George at phone number [phone] if you notice vaginal bleeding, new pelvic or abdominal pains, bloating, feeling full easy, or a change in bladder or bowel function.   Please contact Dr Serita Grit office (at [phone]) in October, 2021 to request an appointment with her for February, 2021.

## 2019-06-04 ENCOUNTER — Other Ambulatory Visit: Payer: Self-pay | Admitting: Gynecologic Oncology

## 2019-06-04 DIAGNOSIS — N951 Menopausal and female climacteric states: Secondary | ICD-10-CM

## 2019-06-22 ENCOUNTER — Ambulatory Visit: Payer: Federal, State, Local not specified - PPO | Attending: Internal Medicine

## 2019-06-22 DIAGNOSIS — Z23 Encounter for immunization: Secondary | ICD-10-CM

## 2019-06-22 NOTE — Progress Notes (Signed)
   Covid-19 Vaccination Clinic  Name:  Tina Ray    MRN: [id number] DOB: 07-24-1970  06/22/2019  [name] was observed post Covid-19 immunization for 15 minutes without incident. She was provided with Vaccine Information Sheet and instruction to access the V-Safe system.   [name] was instructed to call 911 with any severe reactions post vaccine: Marland Kitchen Difficulty breathing  . Swelling of face and throat  . A fast heartbeat  . A bad rash all over body  . Dizziness and weakness   Immunizations Administered    Name Date Dose VIS Date Route   Pfizer COVID-19 Vaccine 06/22/2019  9:03 AM 0.3 mL 03/16/2019 Intramuscular   Manufacturer: [place]   Lot: [id number]   [place]: [id number]

## 2019-07-17 ENCOUNTER — Ambulatory Visit: Payer: Federal, State, Local not specified - PPO | Attending: Internal Medicine

## 2019-07-17 DIAGNOSIS — Z23 Encounter for immunization: Secondary | ICD-10-CM

## 2019-07-17 NOTE — Progress Notes (Signed)
   Covid-19 Vaccination Clinic  Name:  ZAYANA MANAGO    MRN: [id number] DOB: 12/27/1970  07/17/2019  Ms. Ramella was observed post Covid-19 immunization for 15 minutes without incident. She was provided with Vaccine Information Sheet and instruction to access the V-Safe system.   Ms. Hopwood was instructed to call 911 with any severe reactions post vaccine: Marland Kitchen Difficulty breathing  . Swelling of face and throat  . A fast heartbeat  . A bad rash all over body  . Dizziness and weakness   Immunizations Administered    Name Date Dose VIS Date Route   Pfizer COVID-19 Vaccine 07/17/2019  9:49 AM 0.3 mL 03/16/2019 Intramuscular   Manufacturer: [place]   Lot: [id number]   [place]: [id number]

## 2019-07-26 DIAGNOSIS — I10 Essential (primary) hypertension: Secondary | ICD-10-CM | POA: Diagnosis not present

## 2019-07-26 DIAGNOSIS — Z13220 Encounter for screening for lipoid disorders: Secondary | ICD-10-CM | POA: Diagnosis not present

## 2019-07-26 DIAGNOSIS — R635 Abnormal weight gain: Secondary | ICD-10-CM | POA: Diagnosis not present

## 2019-07-26 DIAGNOSIS — Z0000 Encounter for general adult medical examination without abnormal findings: Secondary | ICD-10-CM | POA: Diagnosis not present

## 2019-08-22 DIAGNOSIS — Z23 Encounter for immunization: Secondary | ICD-10-CM | POA: Diagnosis not present

## 2019-10-16 DIAGNOSIS — L814 Other melanin hyperpigmentation: Secondary | ICD-10-CM | POA: Diagnosis not present

## 2019-10-16 DIAGNOSIS — L905 Scar conditions and fibrosis of skin: Secondary | ICD-10-CM | POA: Diagnosis not present

## 2019-10-16 DIAGNOSIS — D225 Melanocytic nevi of trunk: Secondary | ICD-10-CM | POA: Diagnosis not present

## 2019-10-16 DIAGNOSIS — L821 Other seborrheic keratosis: Secondary | ICD-10-CM | POA: Diagnosis not present

## 2019-10-16 DIAGNOSIS — D485 Neoplasm of uncertain behavior of skin: Secondary | ICD-10-CM | POA: Diagnosis not present

## 2019-10-31 DIAGNOSIS — Z01419 Encounter for gynecological examination (general) (routine) without abnormal findings: Secondary | ICD-10-CM | POA: Diagnosis not present

## 2019-10-31 DIAGNOSIS — Z1151 Encounter for screening for human papillomavirus (HPV): Secondary | ICD-10-CM | POA: Diagnosis not present

## 2019-10-31 DIAGNOSIS — Z6837 Body mass index (BMI) 37.0-37.9, adult: Secondary | ICD-10-CM | POA: Diagnosis not present

## 2019-10-31 DIAGNOSIS — Z124 Encounter for screening for malignant neoplasm of cervix: Secondary | ICD-10-CM | POA: Diagnosis not present

## 2019-10-31 DIAGNOSIS — Z130 Encounter for screening for diseases of the blood and blood-forming organs and certain disorders involving the immune mechanism: Secondary | ICD-10-CM | POA: Diagnosis not present

## 2019-11-01 DIAGNOSIS — Z124 Encounter for screening for malignant neoplasm of cervix: Secondary | ICD-10-CM | POA: Diagnosis not present

## 2019-11-01 DIAGNOSIS — Z1151 Encounter for screening for human papillomavirus (HPV): Secondary | ICD-10-CM | POA: Diagnosis not present

## 2019-11-01 DIAGNOSIS — R87610 Atypical squamous cells of undetermined significance on cytologic smear of cervix (ASC-US): Secondary | ICD-10-CM | POA: Diagnosis not present

## 2019-11-07 ENCOUNTER — Telehealth: Payer: Self-pay

## 2019-11-07 NOTE — Telephone Encounter (Signed)
LVM for pt to call back.  Need to give results.

## 2019-11-07 NOTE — Telephone Encounter (Signed)
Pt returned call.  Informed her that HPV high risk is negative.  Pt has no visible lesions and is to follow up as planned and no additional paps.  Pt voiced understanding and thanks for call.

## 2019-11-24 IMAGING — CT CT ABD-PELV W/ CM
2 of 5 series · 15 of 46 positions shown, 17 images · IV contrast (APPLIED)
Comparison: None.

CLINICAL DATA: New diagnosis of endometrial cancer patient with
dysmenorrhea and menorrhagia.

EXAM:
CT ABDOMEN AND PELVIS WITH CONTRAST
TECHNIQUE: Multidetector CT imaging of the abdomen and pelvis was performed
using the standard protocol following bolus administration of
intravenous contrast.
CONTRAST:  100mL 322REH-Q33 IOPAMIDOL ([id number]-Q33) INJECTION 61%

[Series 2: axial st · axial · 0.98mm/px · z∈[-428,-8]mm · 12 of 98 slices shown, 14 images]
[im 7/98  soft-tissue]
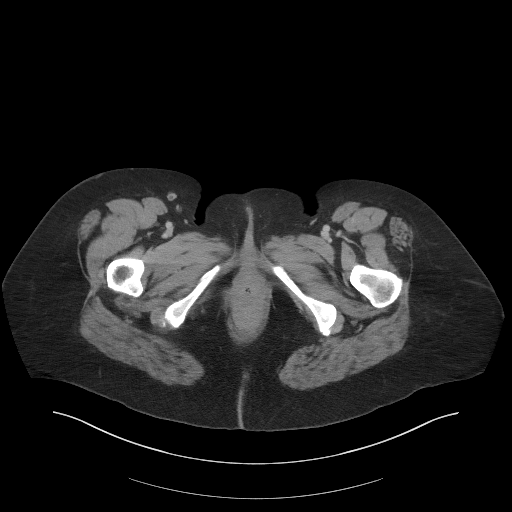
[im 7/98  bone]
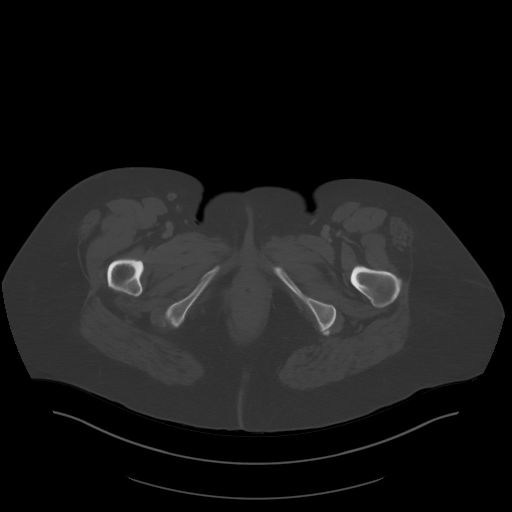
[im 13/98  soft-tissue]
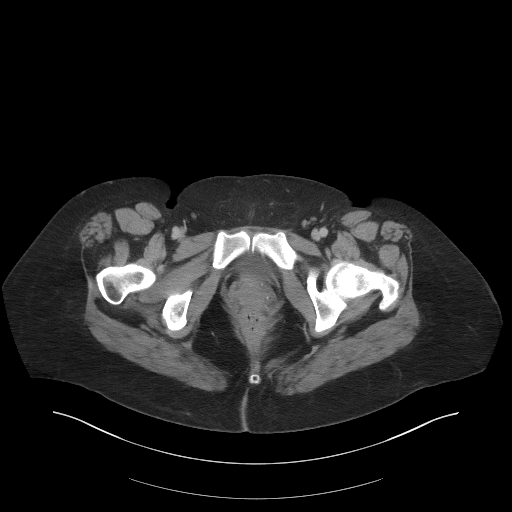
[im 20/98  soft-tissue]
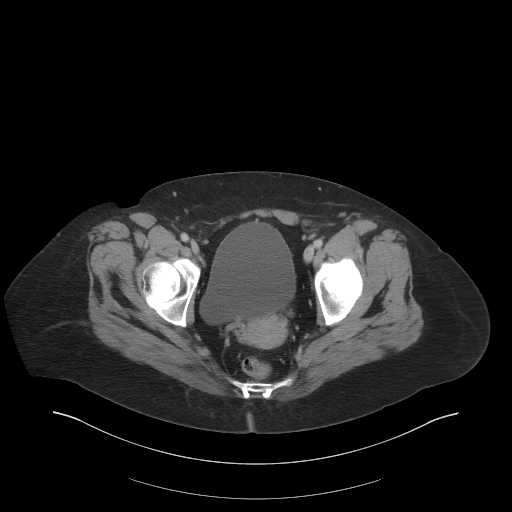
[im 33/98  soft-tissue]
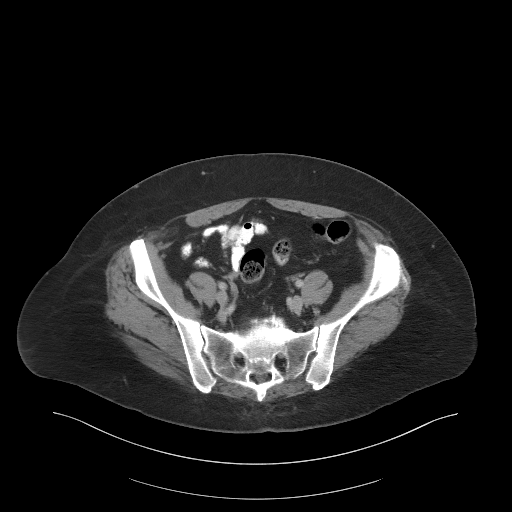
[im 39/98  soft-tissue]
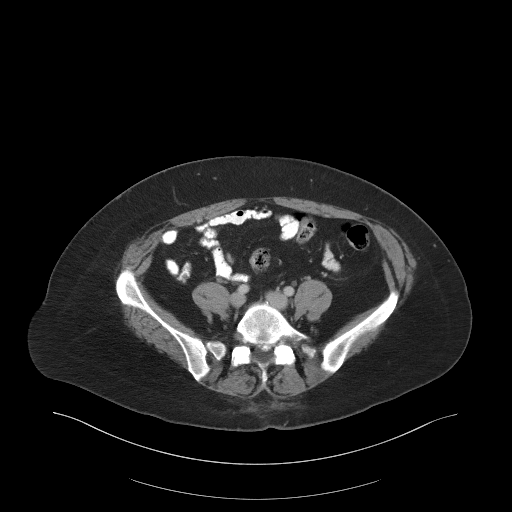
[im 46/98  soft-tissue]
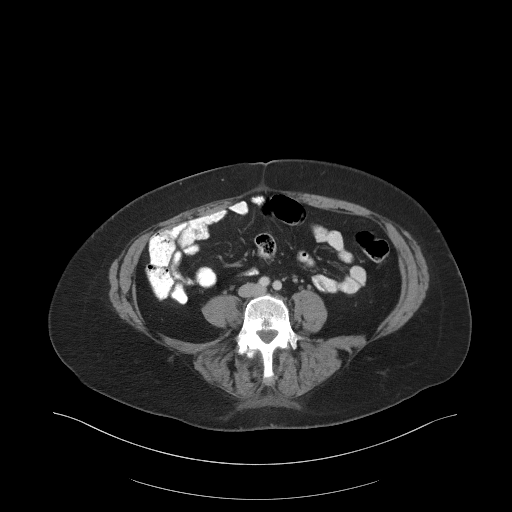
[im 52/98  soft-tissue]
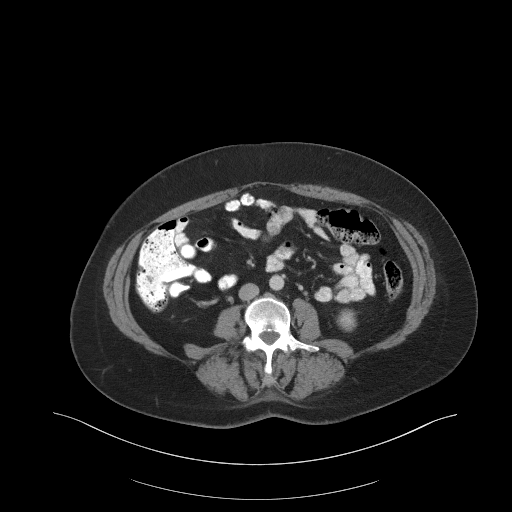
[im 59/98  soft-tissue]
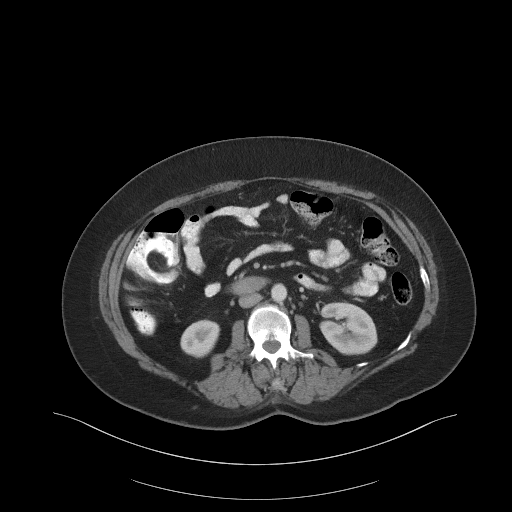
[im 65/98  soft-tissue]
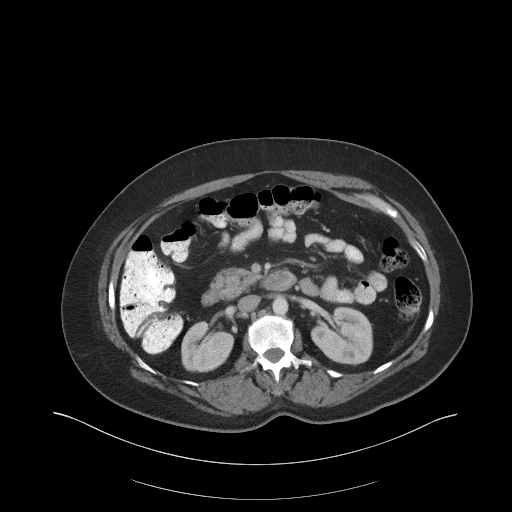
[im 65/98  bone]
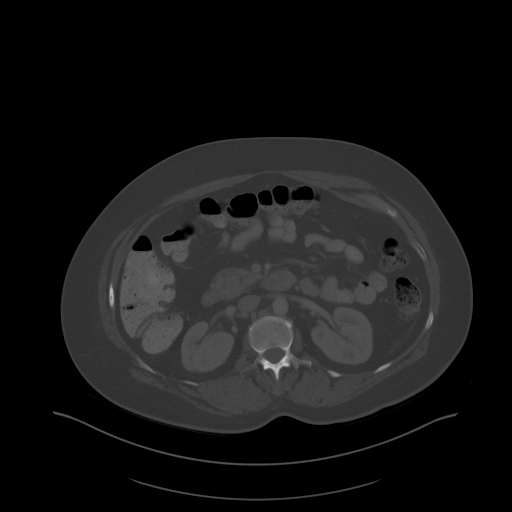
[im 78/98  soft-tissue]
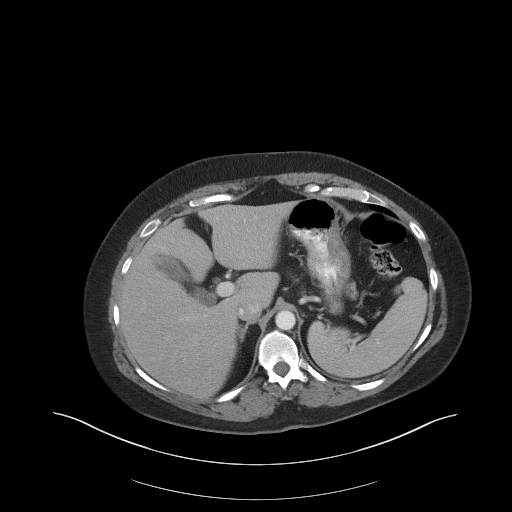
[im 85/98  soft-tissue]
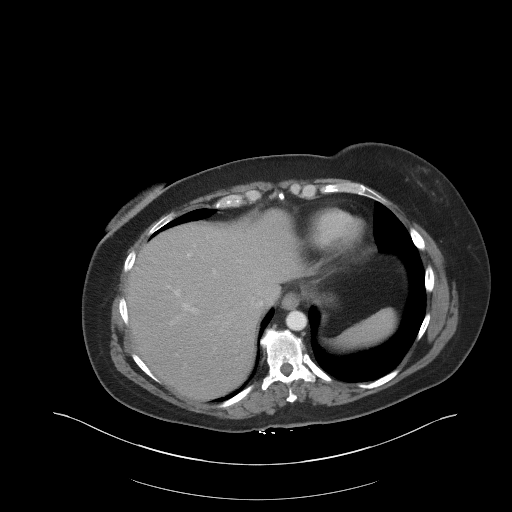
[im 91/98  soft-tissue]
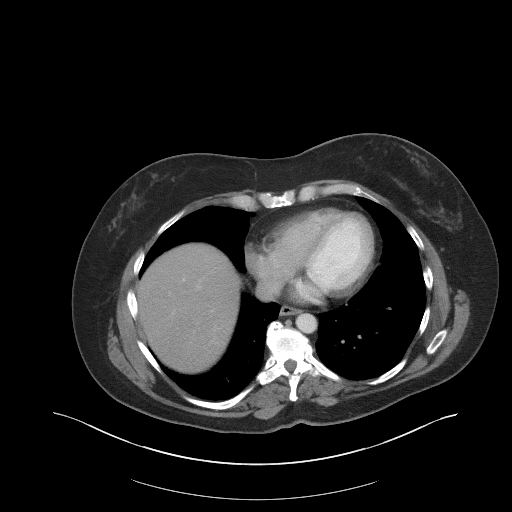

[Series 5: coronal st · coronal · 0.74mm/px · 3 of 95 slices shown]
[im 32/95  soft-tissue]
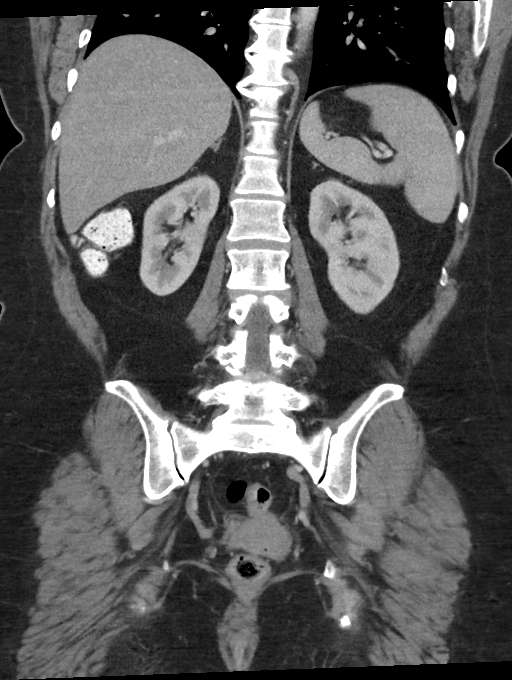
[im 42/95  soft-tissue]
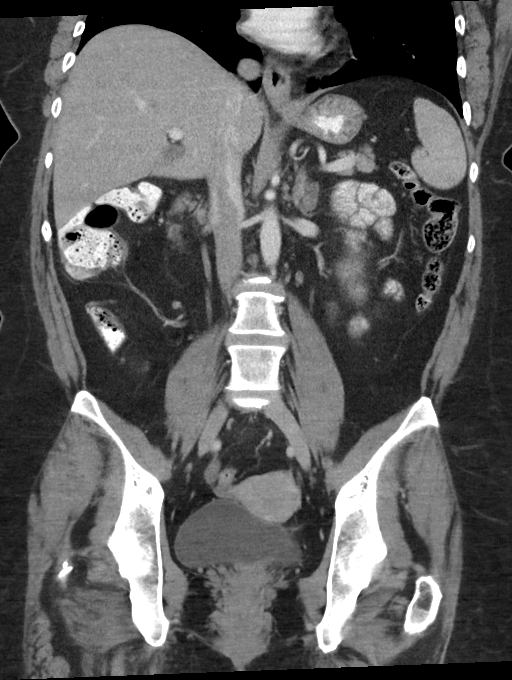
[im 53/95  soft-tissue]
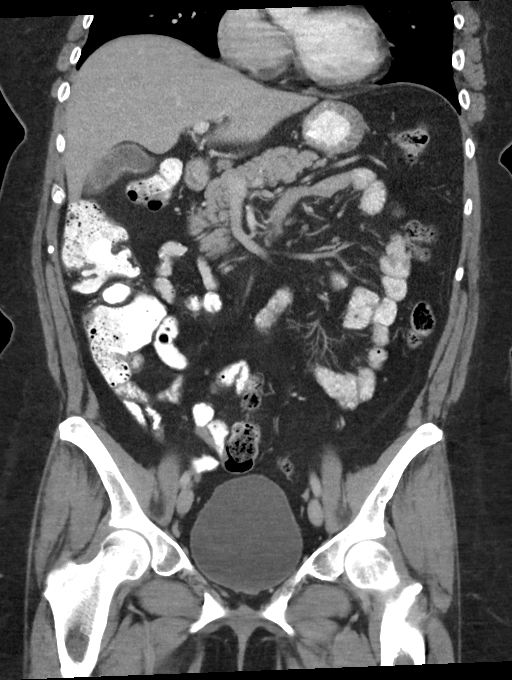

[15 of 46 positions shown; findings below may reference images not displayed]

FINDINGS: Lower chest: The lung bases are clear of an acute process. No
worrisome pulmonary nodules and no pleural effusion or pleural
nodules. The heart is normal in size. No pericardial effusion. The
distal esophagus is grossly normal.

Hepatobiliary: No focal hepatic lesions or intrahepatic biliary
dilatation. Small gallstones are noted in the gallbladder. No common
bile duct dilatation.

Pancreas: No mass, inflammation or ductal dilatation.

Spleen: Normal size.  No focal lesions.

Adrenals/Urinary Tract: The adrenal glands and kidneys are
unremarkable. The bladder is normal.

Stomach/Bowel: The stomach, duodenum, small bowel and colon are
unremarkable. No acute inflammatory changes, mass lesions or
obstructive findings. The terminal ileum is normal.

Vascular/Lymphatic: The aorta is normal in caliber. No dissection.
The branch vessels are patent. The major venous structures are
patent. No mesenteric or retroperitoneal mass or adenopathy. Small
scattered lymph nodes are noted.

Reproductive: Markedly thickened endometrium which appears to be
invading the myometrium. I do not see any serosal involvement or
extra uterine extension. This appears to involve the lower uterine
segment and possible upper cervix. No parametrial disease is
identified. Both ovaries appear normal.

Other: No pelvic adenopathy or inguinal adenopathy. No free pelvic
fluid collections.

Musculoskeletal: No significant bony findings. Age advanced
degenerate disc disease notably at L4-5 and L5-S1. No worrisome bone
lesions.
IMPRESSION: 1. Markedly thickened endometrium which appears to be invading the
myometrium but no CT findings for serosal or extra uterine
extension. Possible involvement of the upper cervix.
2. No findings for locoregional adenopathy in the pelvis.
3. No evidence of metastatic disease elsewhere.

## 2020-01-07 DIAGNOSIS — Z131 Encounter for screening for diabetes mellitus: Secondary | ICD-10-CM | POA: Diagnosis not present

## 2020-01-07 DIAGNOSIS — M542 Cervicalgia: Secondary | ICD-10-CM | POA: Diagnosis not present

## 2020-01-07 DIAGNOSIS — E538 Deficiency of other specified B group vitamins: Secondary | ICD-10-CM | POA: Diagnosis not present

## 2020-01-07 DIAGNOSIS — I10 Essential (primary) hypertension: Secondary | ICD-10-CM | POA: Diagnosis not present

## 2020-01-07 DIAGNOSIS — Z23 Encounter for immunization: Secondary | ICD-10-CM | POA: Diagnosis not present

## 2020-03-25 DIAGNOSIS — R1013 Epigastric pain: Secondary | ICD-10-CM | POA: Diagnosis not present

## 2020-03-26 ENCOUNTER — Other Ambulatory Visit: Payer: Self-pay | Admitting: Family Medicine

## 2020-03-26 ENCOUNTER — Other Ambulatory Visit: Payer: Self-pay | Admitting: Obstetrics and Gynecology

## 2020-03-26 ENCOUNTER — Ambulatory Visit
Admission: RE | Admit: 2020-03-26 | Discharge: 2020-03-26 | Disposition: A | Discharge status: Home / Self Care | Payer: Federal, State, Local not specified - PPO | Source: Ambulatory Visit | Attending: Family Medicine | Admitting: Family Medicine

## 2020-03-26 DIAGNOSIS — Z1231 Encounter for screening mammogram for malignant neoplasm of breast: Secondary | ICD-10-CM

## 2020-03-26 DIAGNOSIS — K8020 Calculus of gallbladder without cholecystitis without obstruction: Secondary | ICD-10-CM | POA: Diagnosis not present

## 2020-03-26 DIAGNOSIS — R1013 Epigastric pain: Secondary | ICD-10-CM

## 2020-03-26 MED ORDER — IOPAMIDOL (ISOVUE-300) INJECTION 61%
100.0000 mL | Freq: Once | INTRAVENOUS | Status: AC | PRN
  Administered 2020-03-26: 100 mL via INTRAVENOUS

## 2020-04-17 DIAGNOSIS — L905 Scar conditions and fibrosis of skin: Secondary | ICD-10-CM | POA: Diagnosis not present

## 2020-04-17 DIAGNOSIS — L814 Other melanin hyperpigmentation: Secondary | ICD-10-CM | POA: Diagnosis not present

## 2020-04-17 DIAGNOSIS — D1801 Hemangioma of skin and subcutaneous tissue: Secondary | ICD-10-CM | POA: Diagnosis not present

## 2020-04-17 DIAGNOSIS — D485 Neoplasm of uncertain behavior of skin: Secondary | ICD-10-CM | POA: Diagnosis not present

## 2020-04-17 DIAGNOSIS — D225 Melanocytic nevi of trunk: Secondary | ICD-10-CM | POA: Diagnosis not present

## 2020-04-29 DIAGNOSIS — L080 Pyoderma: Secondary | ICD-10-CM | POA: Diagnosis not present

## 2020-05-01 DIAGNOSIS — K8020 Calculus of gallbladder without cholecystitis without obstruction: Secondary | ICD-10-CM | POA: Diagnosis not present

## 2020-05-01 DIAGNOSIS — I10 Essential (primary) hypertension: Secondary | ICD-10-CM | POA: Diagnosis not present

## 2020-05-01 DIAGNOSIS — L03115 Cellulitis of right lower limb: Secondary | ICD-10-CM | POA: Diagnosis not present

## 2020-05-05 ENCOUNTER — Other Ambulatory Visit: Payer: Self-pay

## 2020-05-05 ENCOUNTER — Ambulatory Visit
Admission: RE | Admit: 2020-05-05 | Discharge: 2020-05-05 | Disposition: A | Discharge status: Home / Self Care | Payer: Federal, State, Local not specified - PPO | Source: Ambulatory Visit | Attending: Obstetrics and Gynecology | Admitting: Obstetrics and Gynecology

## 2020-05-05 DIAGNOSIS — Z1231 Encounter for screening mammogram for malignant neoplasm of breast: Secondary | ICD-10-CM | POA: Diagnosis not present

## 2020-05-06 HISTORY — PX: CHOLECYSTECTOMY: SHX55

## 2020-05-09 ENCOUNTER — Encounter: Payer: Self-pay | Admitting: Gynecologic Oncology

## 2020-05-12 ENCOUNTER — Encounter: Payer: Self-pay | Admitting: Gynecologic Oncology

## 2020-05-12 ENCOUNTER — Other Ambulatory Visit: Payer: Self-pay

## 2020-05-12 ENCOUNTER — Inpatient Hospital Stay: Payer: Federal, State, Local not specified - PPO | Attending: Gynecologic Oncology | Admitting: Gynecologic Oncology

## 2020-05-12 VITALS — BP 133/77 | HR 89 | Temp 98.0°F | Resp 18 | Wt 217.6 lb

## 2020-05-12 DIAGNOSIS — Z90710 Acquired absence of both cervix and uterus: Secondary | ICD-10-CM | POA: Diagnosis not present

## 2020-05-12 DIAGNOSIS — C541 Malignant neoplasm of endometrium: Secondary | ICD-10-CM | POA: Diagnosis not present

## 2020-05-12 DIAGNOSIS — Z90722 Acquired absence of ovaries, bilateral: Secondary | ICD-10-CM | POA: Insufficient documentation

## 2020-05-12 NOTE — Progress Notes (Signed)
Follow-up Note: Gyn-Onc  Consult was initially requested by Dr. Melba Coon for the evaluation of Tina Ray 50 y.o. female  CC:  Chief Complaint  Patient presents with  . Adenocarcinoma of endometrium [facility])    Assessment/Plan:  Tina Ray  is a 50 y.o.  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  Pathology revealed low risk factors for recurrence, therefore no adjuvant therapy is recommended according to NCCN guidelines.  Hereditary BARD1 mutation of uncertain significance.   I discussed risk for recurrence and typical symptoms encouraged her to notify us of these should they develop between visits.  I recommend she have follow-up every 6 months for 5 years in accordance with NCCN guidelines. Those visits should include symptom assessment, physical exam and pelvic examination. Pap smears are not indicated or recommended in the routine surveillance of endometrial cancer.  She will return to see me in February, 2023, and Dr Melba Coon annually in July.   HPI: Tina Ray was a 50 year old P0 who was seen in consultation at the request of Dr Melba Coon for grade 1 endometrial cancer.   The patient has a history of previously normal regular menstrual cycles up until December 2018 when she began experiencing intermenstrual bleeding.  She then discussed this with her OB/GYN during in May 2019 routine visit.  This prompted a transvaginal ultrasound scan which identified a 1 cm endometrial polyp.  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  On 09/12/17 she underwent robotic assisted total hysterectomy, BSO, SLN biopsy. Final pathology revealed a grade 2 endometrioid (no serous features identified on final pathology), non-invasive lesion measuring 0.8cm. There was no LVSI.  SLN's, cervix and adnexa were negative. IHC normal for MMR and PCR showed MSI normal.  She was determined to have low risk disease for recurrence, and in accordance with NCCN guidelines, no adjuvant therapy was recommended. Given her young age at diagnosis and prior melanoma history, she qualified for genetics evaluation. [facility] assessment at [facility] was positive for a hereditary deleterious mutation in BARD1 of uncertain significance. She was not recommended additional surveillance for this.   She has done well since our last visit, however her father died at Thanksgiving, 9357 from complications associated with COPD.     Interval Hx:  She has no symptoms concerning for recurrent cancer. She has a scheduled cholecystectomy with Dr Redmond Pulling for later in 2022.  Current Meds:  Outpatient Encounter Medications as of 05/12/2020  Medication Sig  . acyclovir (ZOVIRAX) 400 MG tablet Take 400 mg by mouth 2 (two) times daily as needed.  . Calcium Carb-Cholecalciferol (CALCIUM + D3 PO) Take by mouth. gummies  . cyclobenzaprine (FLEXERIL) 10 MG tablet Take 10 mg by mouth 3 (three) times daily as needed for muscle spasms.  Marland Kitchen doxycycline (VIBRAMYCIN) 100 MG capsule Take 100 mg by mouth 2 (two) times daily.  . fluticasone (FLONASE) 50 MCG/ACT nasal spray Use 1 spray in each nostril once daily at night  . ibuprofen (ADVIL,MOTRIN) 200 MG tablet Take 400 mg by mouth 2 (two) times daily as needed for moderate pain.  Marland Kitchen lisinopril-hydrochlorothiazide (PRINZIDE,ZESTORETIC) 20-25 MG tablet Take 1 tablet by mouth daily.  . Menthol, Topical Analgesic, (BIOFREEZE EX) Apply 1 application topically 2 (two) times daily as needed (back pain).  Marland Kitchen venlafaxine XR (EFFEXOR-XR) 75  MG 24 hr capsule TAKE 1 CAPSULE (75 MG TOTAL) BY MOUTH DAILY WITH BREAKFAST. (Patient not taking: Reported on 05/09/2020)   No facility-administered encounter medications on file as of 05/12/2020.    Allergy:  Allergies  Allergen Reactions   . Erythromycin Base Hives  . Sulfa Antibiotics Hives  . Amoxicillin     Other reaction(s): Hives  . Sulfamethoxazole-Trimethoprim     Other reaction(s): Hives    Social Hx:   Social History   Socioeconomic History  . Marital status: Married    Spouse name: Not on file  . Number of children: Not on file  . Years of education: Not on file  . Highest education level: Not on file  Occupational History  . Not on file  Tobacco Use  . Smoking status: Never Smoker  . Smokeless tobacco: Never Used  Vaping Use  . Vaping Use: Never used  Substance and Sexual Activity  . Alcohol use: Yes    Alcohol/week: 3.0 standard drinks    Types: 3 Glasses of wine per week    Comment: 2 or 3 classes of wine daily  . Drug use: Never  . Sexual activity: Yes    Birth control/protection: Surgical  Other Topics Concern  . Not on file  Social History Narrative  . Not on file   Social Determinants of Health   Financial Resource Strain: Not on file  Food Insecurity: Not on file  Transportation Needs: Not on file  Physical Activity: Not on file  Stress: Not on file  Social Connections: Not on file  Intimate Partner Violence: Not on file    Past Surgical Hx:  Past Surgical History:  Procedure Laterality Date  . BREAST BIOPSY Left 03/04/2016  . BREAST BIOPSY Left 03/06/2015  . DENTAL SURGERY     wisedom tooth extraction  . LYMPH NODE BIOPSY N/A 09/12/2017   Procedure: SENTINEL LYMPH NODE BIOPSY;  Surgeon: Everitt Amber, MD;  Location: [facility];  Service: Gynecology;  Laterality: N/A;  . ROBOTIC ASSISTED TOTAL HYSTERECTOMY WITH BILATERAL SALPINGO OOPHERECTOMY Bilateral 09/12/2017   Procedure: XI ROBOTIC ASSISTED TOTAL LAPAROSCOPIC  HYSTERECTOMY WITH BILATERAL SALPINGO OOPHORECTOMY;  Surgeon: Everitt Amber, MD;  Location: [facility];  Service: Gynecology;  Laterality: Bilateral;    Past Medical Hx:  Past Medical History:  Diagnosis Date  . Family history of breast cancer   . Family history of lung  cancer   . Family history of melanoma   . Family history of prostate cancer   . Family history of throat cancer   . Hypertension   . IBS (irritable bowel syndrome)   . Melanoma of skin ([place]) 11/11/2017  . Peripheral vascular disease ([place])   . Skin cancer   . Skin cancer (melanoma) ([place])    1999    Past Gynecological History:   Patient's last menstrual period was 07/04/2017 (exact date).  Family Hx:  Family History  Problem Relation Age of Onset  . Breast cancer Paternal Grandmother 39       dx at very advanced stage.  . Hypercholesterolemia Mother   . Colon polyps Mother        a few every time, goes every 3 years  . Endometriosis Mother   . Diabetes Father   . Multiple sclerosis Father   . Prostate cancer Father 70       metastatic to bone  . Colon polyps Father   . Diabetes Maternal Aunt   . Melanoma Maternal Aunt 82  . Diabetes Maternal  Uncle   . Lung cancer Maternal Uncle   . Throat cancer Maternal Uncle   . Hypertension Maternal Grandmother   . Diabetes Maternal Grandmother   . Diabetes Maternal Grandfather   . Melanoma Maternal Grandfather 8  . Heart attack Maternal Grandfather   . Breast cancer Other        dx >50  . Breast cancer Other        dx >50    Review of Systems:  Constitutional  Feels well,    ENT Normal appearing ears and nares bilaterally Skin/Breast  No rash, sores, jaundice, itching, dryness Cardiovascular  No chest pain, shortness of breath, or edema  Pulmonary  No cough or wheeze.  Gastro Intestinal  No nausea, vomitting, or diarrhoea. No bright red blood per rectum, no abdominal pain, change in bowel movement, or constipation.  Genito Urinary  No frequency, urgency, dysuria, no abnormal uterine bleeding Musculo Skeletal  No myalgia, arthralgia, joint swelling or pain  Neurologic  No weakness, numbness, change in gait,  Psychology  No depression, anxiety, insomnia.   Vitals:  Blood pressure 133/77, pulse 89, temperature 98 F  (36.7 C), temperature source Tympanic, resp. rate 18, weight 217 lb 9.6 oz (98.7 kg), last menstrual period 07/04/2017, SpO2 100 %.  Physical Exam: WD in NAD Neck  Supple NROM, without any enlargements.  Lymph Node Survey No cervical supraclavicular or inguinal adenopathy Cardiovascular  Pulse normal rate, regularity and rhythm. S1 and S2 normal.  Lungs  Clear to auscultation bilateraly, without wheezes/crackles/rhonchi. Good air movement.  Skin  No rash/lesions/breakdown  Psychiatry  Alert and oriented to person, place, and time  Abdomen  Normoactive bowel sounds, abdomen soft, non-tender and overweight without evidence of hernia.  Back No CVA tenderness Genito Urinary  Vulva/vagina: Normal external female genitalia.   No lesions. No discharge or bleeding.  Bladder/urethra:  No lesions or masses, well supported bladder  Vagina: normal  Cervix: Normal appearing, no lesions.  Uterus:  Small, mobile, no parametrial involvement or nodularity.  Adnexa: no palpable masses. Rectal  deferred Extremities  No bilateral cyanosis, clubbing or edema.  Thereasa Solo, MD  05/12/2020, 3:17 PM

## 2020-05-12 NOTE — Patient Instructions (Signed)
Please notify Dr Denman George at phone number [phone] if you notice vaginal bleeding, new pelvic or abdominal pains, bloating, feeling full easy, or a change in bladder or bowel function.   Please return to see Dr Melba Coon in the summer.  Please contact Dr Serita Grit office (at [phone]) in November to request an appointment with her for February, 2023.

## 2020-05-20 DIAGNOSIS — L02415 Cutaneous abscess of right lower limb: Secondary | ICD-10-CM | POA: Diagnosis not present

## 2020-05-20 DIAGNOSIS — L02416 Cutaneous abscess of left lower limb: Secondary | ICD-10-CM | POA: Diagnosis not present

## 2020-05-21 ENCOUNTER — Ambulatory Visit: Payer: Self-pay | Admitting: General Surgery

## 2020-05-21 NOTE — Progress Notes (Signed)
DUE TO COVID-19 ONLY ONE VISITOR IS ALLOWED TO COME WITH YOU AND STAY IN THE WAITING ROOM ONLY DURING PRE OP AND PROCEDURE DAY OF SURGERY. THE 1 VISITOR  MAY VISIT WITH YOU AFTER SURGERY IN YOUR PRIVATE ROOM DURING VISITING HOURS ONLY!  YOU NEED TO HAVE A COVID 19 TEST ON_2/19/2022 ______ @_______ , THIS TEST MUST BE DONE BEFORE SURGERY,  COVID TESTING SITE [address] [place] [address], IT IS ON THE RIGHT GOING OUT [address] APPROXIMATELY  2 MINUTES PAST [employer] ON THE RIGHT. ONCE YOUR COVID TEST IS COMPLETED,  PLEASE BEGIN THE QUARANTINE INSTRUCTIONS AS OUTLINED IN YOUR HANDOUT.                KELDA AZAD  05/21/2020   Your procedure is scheduled on: 05/27/2020    Report to [facility] Main  Entrance   Report to admitting at     1115 AM     Call this number if you have problems the morning of surgery [phone]    REMEMBER: NO  SOLID FOOD CANDY OR GUM AFTER MIDNIGHT. CLEAR LIQUIDS UNTIL   1015am       . NOTHING BY MOUTH EXCEPT CLEAR LIQUIDS UNTIL    . PLEASE FINISH ENSURE DRINK PER SURGEON ORDER  WHICH NEEDS TO BE COMPLETED AT   1015am    .      CLEAR LIQUID DIET   Foods Allowed                                                                    Coffee and tea, regular and decaf                            Fruit ices (not with fruit pulp)                                      Iced Popsicles                                    Carbonated beverages, regular and diet                                    Cranberry, grape and apple juices Sports drinks like Gatorade Lightly seasoned clear broth or consume(fat free) Sugar, honey syrup ___________________________________________________________________      BRUSH YOUR TEETH MORNING OF SURGERY AND RINSE YOUR MOUTH OUT, NO CHEWING GUM CANDY OR MINTS.     Take these medicines the morning of surgery with A SIP OF WATER: none   DO NOT TAKE ANY DIABETIC MEDICATIONS DAY OF YOUR SURGERY                                You may not have any metal on your body including hair pins and              piercings  Do  not wear jewelry, make-up, lotions, powders or perfumes, deodorant             Do not wear nail polish on your fingernails.  Do not shave  48 hours prior to surgery.              Men may shave face and neck.   Do not bring valuables to the hospital. [place].  Contacts, dentures or bridgework may not be worn into surgery.  Leave suitcase in the car. After surgery it may be brought to your room.     Patients discharged the day of surgery will not be allowed to drive home. IF YOU ARE HAVING SURGERY AND GOING HOME THE SAME DAY, YOU MUST HAVE AN ADULT TO DRIVE YOU HOME AND BE WITH YOU FOR 24 HOURS. YOU MAY GO HOME BY TAXI OR UBER OR ORTHERWISE, BUT AN ADULT MUST ACCOMPANY YOU HOME AND STAY WITH YOU FOR 24 HOURS.  Name and phone number of your driver:  Special Instructions: N/A              Please read over the following fact sheets you were given: _____________________________________________________________________  [facility] - Preparing for Surgery Before surgery, you can play an important role.  Because skin is not sterile, your skin needs to be as free of germs as possible.  You can reduce the number of germs on your skin by washing with CHG (chlorahexidine gluconate) soap before surgery.  CHG is an antiseptic cleaner which kills germs and bonds with the skin to continue killing germs even after washing. Please DO NOT use if you have an allergy to CHG or antibacterial soaps.  If your skin becomes reddened/irritated stop using the CHG and inform your nurse when you arrive at Short Stay. Do not shave (including legs and underarms) for at least 48 hours prior to the first CHG shower.  You may shave your face/neck. Please follow these instructions carefully:  1.  Shower with CHG Soap the night before surgery and the  morning of  Surgery.  2.  If you choose to wash your hair, wash your hair first as usual with your  normal  shampoo.  3.  After you shampoo, rinse your hair and body thoroughly to remove the  shampoo.                           4.  Use CHG as you would any other liquid soap.  You can apply chg directly  to the skin and wash                       Gently with a scrungie or clean washcloth.  5.  Apply the CHG Soap to your body ONLY FROM THE NECK DOWN.   Do not use on face/ open                           Wound or open sores. Avoid contact with eyes, ears mouth and genitals (private parts).                       Wash face,  Genitals (private parts) with your normal soap.             6.  Wash thoroughly,  paying special attention to the area where your surgery  will be performed.  7.  Thoroughly rinse your body with warm water from the neck down.  8.  DO NOT shower/wash with your normal soap after using and rinsing off  the CHG Soap.                9.  Pat yourself dry with a clean towel.            10.  Wear clean pajamas.            11.  Place clean sheets on your bed the night of your first shower and do not  sleep with pets. Day of Surgery : Do not apply any lotions/deodorants the morning of surgery.  Please wear clean clothes to the hospital/surgery center.  FAILURE TO FOLLOW THESE INSTRUCTIONS MAY RESULT IN THE CANCELLATION OF YOUR SURGERY PATIENT SIGNATURE_________________________________  NURSE SIGNATURE__________________________________  ________________________________________________________________________

## 2020-05-21 NOTE — H&P (View-Only) (Signed)
[name] Appointment: 05/01/2020 3:00 PM Location: [facility] Patient #: [id number] DOB: [birth year] Married / Language: Cleophus Molt / Race: White Female   History of Present Illness Randall Hiss M. Raynaldo Falco MD; 05/01/2020 5:47 PM) The patient is a 50 year old female who presents for evaluation of gall stones. She is referred by Ammie Dalton PA-C for evaluation of she states that 2 days before Christmas she awoke early in the morning with severe epigastric pain that radiated to her back. It lasted many hours and finally started easing up about 1:00 that she ended up going to see her primary care team. She had labs which were drawn which were normal but she did undergo a CT scan which showed cholelithiasis with haziness around the gallbladder wall. After that event she modified her diet and cut out fried, fatty foods. Since then she hasn't really had an episode. I previously operated on her husband. She has had a laparoscopic-assisted hysterectomy by Dr. Denman George in 2019 for endometrial cancer. She denies any fever, chills, unexplained weight loss, jaundice, melena or hematochezia. No frequent NSAID use.  I reviewed the referring provider's office noted along with labs as well as CT scan   Problem List/Past Medical Randall Hiss M. Redmond Pulling, MD; 05/01/2020 5:51 PM) SYMPTOMATIC CHOLELITHIASIS (K80.20)  WOUND OF SKIN (T14.8XXA)   Past Surgical History Janeann Forehand, CNA; 05/01/2020 2:57 PM) Breast Biopsy  Bilateral. Hysterectomy (due to cancer) - Complete   Diagnostic Studies History Janeann Forehand, CNA; 05/01/2020 2:57 PM) Colonoscopy  never Mammogram  within last year Pap Smear  1-5 years ago  Allergies Janeann Forehand, CNA; 05/01/2020 2:59 PM) [place] Gum *Calcium Channel Blockers  Sulfacetamide *CHEMICALS*  Allergies Reconciled   Medication History Janeann Forehand, CNA; 05/01/2020 3:00 PM) Cyclobenzaprine HCl (10MG  Tablet, Oral) Active. Doxycycline Hyclate (100MG  Capsule,  Oral) Active. Fluticasone Propionate (50MCG/ACT Suspension, Nasal) Active. Lisinopril-hydroCHLOROthiazide (20-25MG  Tablet, Oral) Active. Pantoprazole Sodium (40MG  Tablet DR, Oral) Active. Venlafaxine HCl ER (75MG  Capsule ER 24HR, Oral) Active. Medications Reconciled  Social History Janeann Forehand, CNA; 05/01/2020 2:57 PM) Alcohol use  Moderate alcohol use. Caffeine use  Carbonated beverages, Tea. No drug use  Tobacco use  Never smoker.  Family History Janeann Forehand, CNA; 05/01/2020 2:57 PM) Arthritis  Family Members In General. Breast Cancer  Family Members In General. Cancer  Father. Colon Polyps  Mother. Diabetes Mellitus  Family Members In General. Heart Disease  Family Members In General. Hypertension  Family Members In General. Ischemic Bowel Disease  Mother. Melanoma  Family Members In General. Prostate Cancer  Father. Respiratory Condition  Family Members In General, Father, Mother.  Pregnancy / Birth History Janeann Forehand, CNA; 05/01/2020 2:57 PM) Age at menarche  83 years. Age of menopause  51-55 Contraceptive History  Oral contraceptives. Gravida  0 Para  0  Other Problems Randall Hiss M. Redmond Pulling, MD; 05/01/2020 5:51 PM) Anxiety Disorder  Cancer  Hemorrhoids  Melanoma  High blood pressure     Review of Systems Randall Hiss M. Khylee Algeo MD; 05/01/2020 5:44 PM) General Present- Night Sweats. Not Present- Appetite Loss, Chills, Fatigue, Fever, Weight Gain and Weight Loss. Skin Present- Non-Healing Wounds. Not Present- Change in Wart/Mole, Dryness, Hives, Jaundice, New Lesions, Rash and Ulcer. HEENT Present- Ringing in the Ears and Wears glasses/contact lenses. Not Present- Earache, Hearing Loss, Hoarseness, Nose Bleed, Oral Ulcers, Seasonal Allergies, Sinus Pain, Sore Throat, Visual Disturbances and Yellow Eyes. Respiratory Present- Snoring. Not Present- Bloody sputum, Chronic Cough, Difficulty Breathing and Wheezing. Cardiovascular Present- Leg  Cramps and Swelling of Extremities. Not Present-  Chest Pain, Difficulty Breathing Lying Down, Palpitations, Rapid Heart Rate and Shortness of Breath. Gastrointestinal Present- Hemorrhoids. Not Present- Abdominal Pain, Bloating, Bloody Stool, Change in Bowel Habits, Chronic diarrhea, Constipation, Difficulty Swallowing, Excessive gas, Gets full quickly at meals, Indigestion, Nausea, Rectal Pain and Vomiting. Musculoskeletal Present- Swelling of Extremities. Not Present- Back Pain, Joint Pain, Joint Stiffness, Muscle Pain and Muscle Weakness. Neurological Present- Numbness and Tingling. Not Present- Decreased Memory, Fainting, Headaches, Seizures, Tremor, Trouble walking and Weakness. Psychiatric Present- Anxiety. Not Present- Bipolar, Change in Sleep Pattern, Depression, Fearful and Frequent crying. Endocrine Present- Cold Intolerance. Not Present- Excessive Hunger, Hair Changes, Heat Intolerance, Hot flashes and New Diabetes.  Vitals (Donyelle Alston CNA; 05/01/2020 3:00 PM) 05/01/2020 3:00 PM Weight: 217.5 lb Height: 63in Body Surface Area: 2 m Body Mass Index: 38.53 kg/m  Temp.: 97.51F  Pulse: 84 (Regular)  P.OX: 99% (Room air) BP: 140/80(Sitting, Left Arm, Standard)       Physical Exam Randall Hiss M. Blenda Wisecup MD; 05/01/2020 5:44 PM) The physical exam findings are as follows: Note: class ii obesity  General Mental Status-Alert. General Appearance-Consistent with stated age. Hydration-Well hydrated. Voice-Normal.  Head and Neck Head-normocephalic, atraumatic with no lesions or palpable masses. Trachea-midline. Thyroid Gland Characteristics - normal size and consistency.  Eye Eyeball - Bilateral-Extraocular movements intact. Sclera/Conjunctiva - Bilateral-No scleral icterus.  Chest and Lung Exam Chest and lung exam reveals -quiet, even and easy respiratory effort with no use of accessory muscles and on auscultation, normal breath sounds, no adventitious  sounds and normal vocal resonance. Inspection Chest Wall - Normal. Back - normal.  Breast - Did not examine.  Cardiovascular Cardiovascular examination reveals -normal heart sounds, regular rate and rhythm with no murmurs and normal pedal pulses bilaterally.  Abdomen Inspection Inspection of the abdomen reveals - No Hernias. Skin - Scar - Note: old trocar scars. Palpation/Percussion Palpation and Percussion of the abdomen reveal - Soft, Non Tender, No Rebound tenderness, No Rigidity (guarding) and No hepatosplenomegaly. Auscultation Auscultation of the abdomen reveals - Bowel sounds normal.  Peripheral Vascular Upper Extremity Palpation - Pulses bilaterally normal.  Neurologic Neurologic evaluation reveals -alert and oriented x 3 with no impairment of recent or remote memory. Mental Status-Normal.  Neuropsychiatric The patient's mood and affect are described as -normal. Judgment and Insight-insight is appropriate concerning matters relevant to self.  Musculoskeletal Normal Exam - Left-Upper Extremity Strength Normal and Lower Extremity Strength Normal. Normal Exam - Right-Upper Extremity Strength Normal and Lower Extremity Strength Normal.  Lymphatic Head & Neck  General Head & Neck Lymphatics: Bilateral - Description - Normal. Axillary - Did not examine. Femoral & Inguinal - Did not examine.    Assessment & Plan Randall Hiss M. Amauris Debois MD; 05/01/2020 5:51 PM) SYMPTOMATIC CHOLELITHIASIS (K80.20) Impression: I believe the patient's symptoms are consistent with gallbladder disease.  We discussed gallbladder disease. The patient was given Neurosurgeon. We discussed non-operative and operative management. We discussed the signs & symptoms of acute cholecystitis  I discussed laparoscopic cholecystectomy with possible IOC in detail. The patient was given educational material as well as diagrams detailing the procedure. We discussed the risks and benefits of a  laparoscopic cholecystectomy including, but not limited to bleeding, infection, injury to surrounding structures such as the intestine or liver, bile leak, retained gallstones, need to convert to an open procedure, prolonged diarrhea, blood clots such as DVT, common bile duct injury, anesthesia risks, and possible need for additional procedures. We discussed the typical post-operative recovery course. I explained that the likelihood of improvement  of their symptoms is good.  The patient has elected to proceed with surgery.  This patient encounter took 36 minutes today to perform the following: take history, perform exam, review outside records, interpret imaging, counsel the patient on their diagnosis and document encounter, findings & plan in the EHR Current Plans Pt Education - Pamphlet Given - Laparoscopic Gallbladder Surgery: discussed with patient and provided information. You are being scheduled for surgery- Our schedulers will call you.  You should hear from our office's scheduling department within 5 working days about the location, date, and time of surgery. We try to make accommodations for patient's preferences in scheduling surgery, but sometimes the OR schedule or the surgeon's schedule prevents [place] from making those accommodations.  If you have not heard from our office [phone]) in 5 working days, call the office and ask for your surgeon's nurse.  If you have other questions about your diagnosis, plan, or surgery, call the office and ask for your surgeon's nurse.  HYPERTENSION, ESSENTIAL (I10) WOUND OF SKIN (T14.8XXA) Impression: She had a recent dermatological procedure on her right lower leg. She is placed on doxycycline for possible infection. She has been examined. There is some mild cellulitis around an open shallow wound. I gave her some advice on when care. I think the current soft tissue skin mild cellulitis would not preclude going to the operating room for her  gallbladder and will probably be resolved by the time we get to surgery for her gallbladder  Leighton Ruff. Redmond Pulling, MD, FACS General, Bariatric, & Minimally Invasive Surgery [facility], [place]

## 2020-05-21 NOTE — H&P (Signed)
[name] Appointment: 05/01/2020 3:00 PM Location: [facility] Patient #: [id number] DOB: June 29, 1970 Married / Language: Tina Ray / Race: White Female   History of Present Illness Randall Hiss M. Florella Mcneese MD; 05/01/2020 5:47 PM) The patient is a 50 year old female who presents for evaluation of gall stones. She is referred by Ammie Dalton PA-C for evaluation of she states that 2 days before Christmas she awoke early in the morning with severe epigastric pain that radiated to her back. It lasted many hours and finally started easing up about 1:00 that she ended up going to see her primary care team. She had labs which were drawn which were normal but she did undergo a CT scan which showed cholelithiasis with haziness around the gallbladder wall. After that event she modified her diet and cut out fried, fatty foods. Since then she hasn't really had an episode. I previously operated on her husband. She has had a laparoscopic-assisted hysterectomy by Dr. Denman George in 2019 for endometrial cancer. She denies any fever, chills, unexplained weight loss, jaundice, melena or hematochezia. No frequent NSAID use.  I reviewed the referring provider's office noted along with labs as well as CT scan   Problem List/Past Medical Randall Hiss M. Redmond Pulling, MD; 05/01/2020 5:51 PM) SYMPTOMATIC CHOLELITHIASIS (K80.20)  WOUND OF SKIN (T14.8XXA)   Past Surgical History Janeann Forehand, CNA; 05/01/2020 2:57 PM) Breast Biopsy  Bilateral. Hysterectomy (due to cancer) - Complete   Diagnostic Studies History Janeann Forehand, CNA; 05/01/2020 2:57 PM) Colonoscopy  never Mammogram  within last year Pap Smear  1-5 years ago  Allergies Janeann Forehand, CNA; 05/01/2020 2:59 PM) [place] Gum *Calcium Channel Blockers  Sulfacetamide *CHEMICALS*  Allergies Reconciled   Medication History Janeann Forehand, CNA; 05/01/2020 3:00 PM) Cyclobenzaprine HCl (10MG  Tablet, Oral) Active. Doxycycline Hyclate (100MG  Capsule,  Oral) Active. Fluticasone Propionate (50MCG/ACT Suspension, Nasal) Active. Lisinopril-hydroCHLOROthiazide (20-25MG  Tablet, Oral) Active. Pantoprazole Sodium (40MG  Tablet DR, Oral) Active. Venlafaxine HCl ER (75MG  Capsule ER 24HR, Oral) Active. Medications Reconciled  Social History Janeann Forehand, CNA; 05/01/2020 2:57 PM) Alcohol use  Moderate alcohol use. Caffeine use  Carbonated beverages, Tea. No drug use  Tobacco use  Never smoker.  Family History Janeann Forehand, CNA; 05/01/2020 2:57 PM) Arthritis  Family Members In General. Breast Cancer  Family Members In General. Cancer  Father. Colon Polyps  Mother. Diabetes Mellitus  Family Members In General. Heart Disease  Family Members In General. Hypertension  Family Members In General. Ischemic Bowel Disease  Mother. Melanoma  Family Members In General. Prostate Cancer  Father. Respiratory Condition  Family Members In General, Father, Mother.  Pregnancy / Birth History Janeann Forehand, CNA; 05/01/2020 2:57 PM) Age at menarche  61 years. Age of menopause  51-55 Contraceptive History  Oral contraceptives. Gravida  0 Para  0  Other Problems Randall Hiss M. Redmond Pulling, MD; 05/01/2020 5:51 PM) Anxiety Disorder  Cancer  Hemorrhoids  Melanoma  High blood pressure     Review of Systems Randall Hiss M. Javon Snee MD; 05/01/2020 5:44 PM) General Present- Night Sweats. Not Present- Appetite Loss, Chills, Fatigue, Fever, Weight Gain and Weight Loss. Skin Present- Non-Healing Wounds. Not Present- Change in Wart/Mole, Dryness, Hives, Jaundice, New Lesions, Rash and Ulcer. HEENT Present- Ringing in the Ears and Wears glasses/contact lenses. Not Present- Earache, Hearing Loss, Hoarseness, Nose Bleed, Oral Ulcers, Seasonal Allergies, Sinus Pain, Sore Throat, Visual Disturbances and Yellow Eyes. Respiratory Present- Snoring. Not Present- Bloody sputum, Chronic Cough, Difficulty Breathing and Wheezing. Cardiovascular Present- Leg  Cramps and Swelling of Extremities. Not Present-  Chest Pain, Difficulty Breathing Lying Down, Palpitations, Rapid Heart Rate and Shortness of Breath. Gastrointestinal Present- Hemorrhoids. Not Present- Abdominal Pain, Bloating, Bloody Stool, Change in Bowel Habits, Chronic diarrhea, Constipation, Difficulty Swallowing, Excessive gas, Gets full quickly at meals, Indigestion, Nausea, Rectal Pain and Vomiting. Musculoskeletal Present- Swelling of Extremities. Not Present- Back Pain, Joint Pain, Joint Stiffness, Muscle Pain and Muscle Weakness. Neurological Present- Numbness and Tingling. Not Present- Decreased Memory, Fainting, Headaches, Seizures, Tremor, Trouble walking and Weakness. Psychiatric Present- Anxiety. Not Present- Bipolar, Change in Sleep Pattern, Depression, Fearful and Frequent crying. Endocrine Present- Cold Intolerance. Not Present- Excessive Hunger, Hair Changes, Heat Intolerance, Hot flashes and New Diabetes.  Vitals (Donyelle Alston CNA; 05/01/2020 3:00 PM) 05/01/2020 3:00 PM Weight: 217.5 lb Height: 63in Body Surface Area: 2 m Body Mass Index: 38.53 kg/m  Temp.: 97.35F  Pulse: 84 (Regular)  P.OX: 99% (Room air) BP: 140/80(Sitting, Left Arm, Standard)       Physical Exam Randall Hiss M. Joyanna Kleman MD; 05/01/2020 5:44 PM) The physical exam findings are as follows: Note: class ii obesity  General Mental Status-Alert. General Appearance-Consistent with stated age. Hydration-Well hydrated. Voice-Normal.  Head and Neck Head-normocephalic, atraumatic with no lesions or palpable masses. Trachea-midline. Thyroid Gland Characteristics - normal size and consistency.  Eye Eyeball - Bilateral-Extraocular movements intact. Sclera/Conjunctiva - Bilateral-No scleral icterus.  Chest and Lung Exam Chest and lung exam reveals -quiet, even and easy respiratory effort with no use of accessory muscles and on auscultation, normal breath sounds, no adventitious  sounds and normal vocal resonance. Inspection Chest Wall - Normal. Back - normal.  Breast - Did not examine.  Cardiovascular Cardiovascular examination reveals -normal heart sounds, regular rate and rhythm with no murmurs and normal pedal pulses bilaterally.  Abdomen Inspection Inspection of the abdomen reveals - No Hernias. Skin - Scar - Note: old trocar scars. Palpation/Percussion Palpation and Percussion of the abdomen reveal - Soft, Non Tender, No Rebound tenderness, No Rigidity (guarding) and No hepatosplenomegaly. Auscultation Auscultation of the abdomen reveals - Bowel sounds normal.  Peripheral Vascular Upper Extremity Palpation - Pulses bilaterally normal.  Neurologic Neurologic evaluation reveals -alert and oriented x 3 with no impairment of recent or remote memory. Mental Status-Normal.  Neuropsychiatric The patient's mood and affect are described as -normal. Judgment and Insight-insight is appropriate concerning matters relevant to self.  Musculoskeletal Normal Exam - Left-Upper Extremity Strength Normal and Lower Extremity Strength Normal. Normal Exam - Right-Upper Extremity Strength Normal and Lower Extremity Strength Normal.  Lymphatic Head & Neck  General Head & Neck Lymphatics: Bilateral - Description - Normal. Axillary - Did not examine. Femoral & Inguinal - Did not examine.    Assessment & Plan Randall Hiss M. Lonnel Gjerde MD; 05/01/2020 5:51 PM) SYMPTOMATIC CHOLELITHIASIS (K80.20) Impression: I believe the patient's symptoms are consistent with gallbladder disease.  We discussed gallbladder disease. The patient was given Neurosurgeon. We discussed non-operative and operative management. We discussed the signs & symptoms of acute cholecystitis  I discussed laparoscopic cholecystectomy with possible IOC in detail. The patient was given educational material as well as diagrams detailing the procedure. We discussed the risks and benefits of a  laparoscopic cholecystectomy including, but not limited to bleeding, infection, injury to surrounding structures such as the intestine or liver, bile leak, retained gallstones, need to convert to an open procedure, prolonged diarrhea, blood clots such as DVT, common bile duct injury, anesthesia risks, and possible need for additional procedures. We discussed the typical post-operative recovery course. I explained that the likelihood of improvement  of their symptoms is good.  The patient has elected to proceed with surgery.  This patient encounter took 36 minutes today to perform the following: take history, perform exam, review outside records, interpret imaging, counsel the patient on their diagnosis and document encounter, findings & plan in the EHR Current Plans Pt Education - Pamphlet Given - Laparoscopic Gallbladder Surgery: discussed with patient and provided information. You are being scheduled for surgery- Our schedulers will call you.  You should hear from our office's scheduling department within 5 working days about the location, date, and time of surgery. We try to make accommodations for patient's preferences in scheduling surgery, but sometimes the OR schedule or the surgeon's schedule prevents [place] from making those accommodations.  If you have not heard from our office [phone]) in 5 working days, call the office and ask for your surgeon's nurse.  If you have other questions about your diagnosis, plan, or surgery, call the office and ask for your surgeon's nurse.  HYPERTENSION, ESSENTIAL (I10) WOUND OF SKIN (T14.8XXA) Impression: She had a recent dermatological procedure on her right lower leg. She is placed on doxycycline for possible infection. She has been examined. There is some mild cellulitis around an open shallow wound. I gave her some advice on when care. I think the current soft tissue skin mild cellulitis would not preclude going to the operating room for her  gallbladder and will probably be resolved by the time we get to surgery for her gallbladder  Leighton Ruff. Redmond Pulling, MD, FACS General, Bariatric, & Minimally Invasive Surgery [facility], [place]

## 2020-05-23 ENCOUNTER — Encounter (HOSPITAL_COMMUNITY)
Admission: RE | Admit: 2020-05-23 | Discharge: 2020-05-23 | Disposition: A | Discharge status: Home / Self Care | Payer: Federal, State, Local not specified - PPO | Source: Ambulatory Visit | Attending: General Surgery | Admitting: General Surgery

## 2020-05-23 ENCOUNTER — Other Ambulatory Visit: Payer: Self-pay

## 2020-05-23 ENCOUNTER — Encounter (HOSPITAL_COMMUNITY): Payer: Self-pay

## 2020-05-23 DIAGNOSIS — Z01818 Encounter for other preprocedural examination: Secondary | ICD-10-CM | POA: Diagnosis not present

## 2020-05-23 HISTORY — DX: Sleep apnea, unspecified: G47.30

## 2020-05-23 HISTORY — DX: Malignant neoplasm of endometrium: C54.1

## 2020-05-23 HISTORY — DX: Unspecified osteoarthritis, unspecified site: M19.90

## 2020-05-23 HISTORY — DX: Anxiety disorder, unspecified: F41.9

## 2020-05-23 LAB — CBC WITH DIFFERENTIAL/PLATELET
Abs Immature Granulocytes: 0.04 10*3/uL (ref 0.00–0.07)
Basophils Absolute: 0.1 10*3/uL (ref 0.0–0.1)
Basophils Relative: 1 %
Eosinophils Absolute: 0.2 10*3/uL (ref 0.0–0.5)
Eosinophils Relative: 3 %
HCT: 46.7 % — ABNORMAL HIGH (ref 36.0–46.0)
Hemoglobin: 16.1 g/dL — ABNORMAL HIGH (ref 12.0–15.0)
Immature Granulocytes: 1 %
Lymphocytes Relative: 28 %
Lymphs Abs: 1.8 10*3/uL (ref 0.7–4.0)
MCH: 32.5 pg (ref 26.0–34.0)
MCHC: 34.5 g/dL (ref 30.0–36.0)
MCV: 94.2 fL (ref 80.0–100.0)
Monocytes Absolute: 0.4 10*3/uL (ref 0.1–1.0)
Monocytes Relative: 7 %
Neutro Abs: 3.9 10*3/uL (ref 1.7–7.7)
Neutrophils Relative %: 60 %
Platelets: 198 10*3/uL (ref 150–400)
RBC: 4.96 MIL/uL (ref 3.87–5.11)
RDW: 11.7 % (ref 11.5–15.5)
WBC: 6.5 10*3/uL (ref 4.0–10.5)
nRBC: 0 % (ref 0.0–0.2)

## 2020-05-23 LAB — COMPREHENSIVE METABOLIC PANEL WITH GFR
ALT: 18 U/L (ref 0–44)
AST: 21 U/L (ref 15–41)
Albumin: 4.4 g/dL (ref 3.5–5.0)
Alkaline Phosphatase: 61 U/L (ref 38–126)
Anion gap: 12 (ref 5–15)
BUN: 14 mg/dL (ref 6–20)
CO2: 23 mmol/L (ref 22–32)
Calcium: 9.4 mg/dL (ref 8.9–10.3)
Chloride: 103 mmol/L (ref 98–111)
Creatinine, Ser: 0.73 mg/dL (ref 0.44–1.00)
GFR, Estimated: >60 mL/min
Glucose, Bld: 110 mg/dL — ABNORMAL HIGH (ref 70–99)
Potassium: 4.2 mmol/L (ref 3.5–5.1)
Sodium: 138 mmol/L (ref 135–145)
Total Bilirubin: 1.2 mg/dL (ref 0.3–1.2)
Total Protein: 7.6 g/dL (ref 6.5–8.1)

## 2020-05-23 NOTE — Progress Notes (Signed)
Called and requested LOV note and ekg from PCP Dr Christella Noa.

## 2020-05-23 NOTE — Progress Notes (Signed)
Anesthesia Review:  PCP: DR Christella Noa at [facility]  Cardiologist : Chest x-ray : EKG : 03/2020 on chart from [facility]  Echo : Stress test: Cardiac Cath :  Activity level: can do a flight of stairs without difficulty  Sleep Study/ CPAP : uses mouth guard for sleep apnea  Fasting Blood Sugar :      / Checks Blood Sugar -- times a day:   Blood Thinner/ Instructions /Last Dose: ASA / Instructions/ Last Dose :

## 2020-05-24 ENCOUNTER — Other Ambulatory Visit (HOSPITAL_COMMUNITY)
Admission: RE | Admit: 2020-05-24 | Discharge: 2020-05-24 | Disposition: A | Discharge status: Home / Self Care | Payer: Federal, State, Local not specified - PPO | Source: Ambulatory Visit | Attending: General Surgery | Admitting: General Surgery

## 2020-05-24 DIAGNOSIS — Z01812 Encounter for preprocedural laboratory examination: Secondary | ICD-10-CM | POA: Diagnosis not present

## 2020-05-24 DIAGNOSIS — Z20822 Contact with and (suspected) exposure to covid-19: Secondary | ICD-10-CM | POA: Diagnosis not present

## 2020-05-24 LAB — SARS CORONAVIRUS 2 (TAT 6-24 HRS): SARS Coronavirus 2: NEGATIVE

## 2020-05-27 ENCOUNTER — Ambulatory Visit (HOSPITAL_COMMUNITY): Payer: Federal, State, Local not specified - PPO | Admitting: Physician Assistant

## 2020-05-27 ENCOUNTER — Observation Stay (HOSPITAL_COMMUNITY)
Admission: RE | Admit: 2020-05-27 | Discharge: 2020-05-28 | Disposition: A | Discharge status: Home / Self Care | Payer: Federal, State, Local not specified - PPO | Attending: General Surgery | Admitting: General Surgery

## 2020-05-27 ENCOUNTER — Encounter (HOSPITAL_COMMUNITY): Payer: Self-pay | Admitting: General Surgery

## 2020-05-27 ENCOUNTER — Ambulatory Visit (HOSPITAL_COMMUNITY): Payer: Federal, State, Local not specified - PPO | Admitting: Certified Registered Nurse Anesthetist

## 2020-05-27 ENCOUNTER — Encounter (HOSPITAL_COMMUNITY)
Admission: RE | Disposition: A | Discharge status: Home / Self Care | Payer: Self-pay | Source: Home / Self Care | Attending: General Surgery

## 2020-05-27 ENCOUNTER — Other Ambulatory Visit: Payer: Self-pay

## 2020-05-27 ENCOUNTER — Ambulatory Visit: Payer: Self-pay | Admitting: General Surgery

## 2020-05-27 DIAGNOSIS — K8010 Calculus of gallbladder with chronic cholecystitis without obstruction: Secondary | ICD-10-CM | POA: Diagnosis not present

## 2020-05-27 DIAGNOSIS — G4730 Sleep apnea, unspecified: Secondary | ICD-10-CM | POA: Diagnosis not present

## 2020-05-27 DIAGNOSIS — E669 Obesity, unspecified: Secondary | ICD-10-CM | POA: Insufficient documentation

## 2020-05-27 DIAGNOSIS — I10 Essential (primary) hypertension: Secondary | ICD-10-CM | POA: Diagnosis not present

## 2020-05-27 DIAGNOSIS — Z6838 Body mass index (BMI) 38.0-38.9, adult: Secondary | ICD-10-CM | POA: Insufficient documentation

## 2020-05-27 DIAGNOSIS — F419 Anxiety disorder, unspecified: Secondary | ICD-10-CM | POA: Diagnosis not present

## 2020-05-27 DIAGNOSIS — Z79899 Other long term (current) drug therapy: Secondary | ICD-10-CM | POA: Insufficient documentation

## 2020-05-27 DIAGNOSIS — Z9049 Acquired absence of other specified parts of digestive tract: Secondary | ICD-10-CM

## 2020-05-27 DIAGNOSIS — K8020 Calculus of gallbladder without cholecystitis without obstruction: Secondary | ICD-10-CM | POA: Diagnosis not present

## 2020-05-27 SURGERY — CHOLECYSTECTOMY, ROBOT-ASSISTED, LAPAROSCOPIC
Anesthesia: General

## 2020-05-27 MED ORDER — KCL IN DEXTROSE-NACL 20-5-0.45 MEQ/L-%-% IV SOLN
INTRAVENOUS | Status: DC
  Filled 2020-05-27: qty 1000

## 2020-05-27 MED ORDER — CIPROFLOXACIN IN D5W 400 MG/200ML IV SOLN
400.0000 mg | INTRAVENOUS | Status: AC
  Administered 2020-05-27: 400 mg via INTRAVENOUS
  Filled 2020-05-27: qty 200

## 2020-05-27 MED ORDER — MIDAZOLAM HCL 2 MG/2ML IJ SOLN
INTRAMUSCULAR | Status: AC
  Filled 2020-05-27: qty 2

## 2020-05-27 MED ORDER — BUPIVACAINE LIPOSOME 1.3 % IJ SUSP
20.0000 mL | Freq: Once | INTRAMUSCULAR | Status: AC
  Administered 2020-05-27: 20 mL
  Filled 2020-05-27: qty 20

## 2020-05-27 MED ORDER — HYDROMORPHONE HCL 1 MG/ML IJ SOLN
INTRAMUSCULAR | Status: AC
  Filled 2020-05-27: qty 1

## 2020-05-27 MED ORDER — ACETAMINOPHEN 500 MG PO TABS
1000.0000 mg | ORAL_TABLET | Freq: Three times a day (TID) | ORAL | Status: DC
  Administered 2020-05-27 – 2020-05-28 (×3): 1000 mg via ORAL
  Filled 2020-05-27 (×3): qty 2

## 2020-05-27 MED ORDER — ONDANSETRON HCL 4 MG/2ML IJ SOLN: 4.0000 mg | Freq: Four times a day (QID) | INTRAMUSCULAR | Status: DC | PRN

## 2020-05-27 MED ORDER — FENTANYL CITRATE (PF) 250 MCG/5ML IJ SOLN
INTRAMUSCULAR | Status: DC | PRN
  Administered 2020-05-27 (×2): 50 ug via INTRAVENOUS
  Administered 2020-05-27: 150 ug via INTRAVENOUS
  Administered 2020-05-27 (×2): 50 ug via INTRAVENOUS

## 2020-05-27 MED ORDER — FENTANYL CITRATE (PF) 250 MCG/5ML IJ SOLN
INTRAMUSCULAR | Status: AC
  Filled 2020-05-27: qty 5

## 2020-05-27 MED ORDER — LISINOPRIL 20 MG PO TABS
20.0000 mg | ORAL_TABLET | Freq: Every day | ORAL | Status: DC
  Administered 2020-05-28: 20 mg via ORAL
  Filled 2020-05-27: qty 1

## 2020-05-27 MED ORDER — LACTATED RINGERS IR SOLN
Status: DC | PRN
  Administered 2020-05-27 (×2): 3000 mL

## 2020-05-27 MED ORDER — PHENYLEPHRINE 40 MCG/ML (10ML) SYRINGE FOR IV PUSH (FOR BLOOD PRESSURE SUPPORT)
PREFILLED_SYRINGE | INTRAVENOUS | Status: AC
  Filled 2020-05-27: qty 10

## 2020-05-27 MED ORDER — OXYCODONE HCL 5 MG PO TABS
ORAL_TABLET | ORAL | Status: AC
  Filled 2020-05-27: qty 1

## 2020-05-27 MED ORDER — PHENYLEPHRINE HCL-NACL 10-0.9 MG/250ML-% IV SOLN
INTRAVENOUS | Status: DC | PRN
  Administered 2020-05-27: 25 ug/min via INTRAVENOUS

## 2020-05-27 MED ORDER — CHLORHEXIDINE GLUCONATE 0.12 % MT SOLN
15.0000 mL | Freq: Once | OROMUCOSAL | Status: AC
  Administered 2020-05-27: 15 mL via OROMUCOSAL

## 2020-05-27 MED ORDER — ACETAMINOPHEN 500 MG PO TABS
1000.0000 mg | ORAL_TABLET | ORAL | Status: AC
  Administered 2020-05-27: 1000 mg via ORAL
  Filled 2020-05-27: qty 2

## 2020-05-27 MED ORDER — DEXAMETHASONE SODIUM PHOSPHATE 10 MG/ML IJ SOLN
INTRAMUSCULAR | Status: DC | PRN
  Administered 2020-05-27: 5 mg via INTRAVENOUS

## 2020-05-27 MED ORDER — DIPHENHYDRAMINE HCL 12.5 MG/5ML PO ELIX: 12.5000 mg | ORAL_SOLUTION | Freq: Four times a day (QID) | ORAL | Status: DC | PRN

## 2020-05-27 MED ORDER — DIPHENHYDRAMINE HCL 50 MG/ML IJ SOLN: 12.5000 mg | Freq: Four times a day (QID) | INTRAMUSCULAR | Status: DC | PRN

## 2020-05-27 MED ORDER — ONDANSETRON HCL 4 MG/2ML IJ SOLN
INTRAMUSCULAR | Status: AC
  Filled 2020-05-27: qty 2

## 2020-05-27 MED ORDER — 0.9 % SODIUM CHLORIDE (POUR BTL) OPTIME
TOPICAL | Status: DC | PRN
  Administered 2020-05-27: 1000 mL

## 2020-05-27 MED ORDER — FENTANYL CITRATE (PF) 100 MCG/2ML IJ SOLN
INTRAMUSCULAR | Status: AC
  Filled 2020-05-27: qty 2

## 2020-05-27 MED ORDER — CHLORHEXIDINE GLUCONATE CLOTH 2 % EX PADS: 6.0000 | MEDICATED_PAD | Freq: Once | CUTANEOUS | Status: DC

## 2020-05-27 MED ORDER — GABAPENTIN 300 MG PO CAPS
300.0000 mg | ORAL_CAPSULE | ORAL | Status: AC
  Administered 2020-05-27: 300 mg via ORAL
  Filled 2020-05-27: qty 1

## 2020-05-27 MED ORDER — ROCURONIUM BROMIDE 10 MG/ML (PF) SYRINGE
PREFILLED_SYRINGE | INTRAVENOUS | Status: AC
  Filled 2020-05-27: qty 10

## 2020-05-27 MED ORDER — PROPOFOL 10 MG/ML IV BOLUS
INTRAVENOUS | Status: AC
  Filled 2020-05-27: qty 20

## 2020-05-27 MED ORDER — ONDANSETRON HCL 4 MG/2ML IJ SOLN: 4.0000 mg | Freq: Once | INTRAMUSCULAR | Status: DC | PRN

## 2020-05-27 MED ORDER — ARTIFICIAL TEARS OPHTHALMIC OINT
TOPICAL_OINTMENT | OPHTHALMIC | Status: AC
  Filled 2020-05-27: qty 3.5

## 2020-05-27 MED ORDER — SIMETHICONE 80 MG PO CHEW: 40.0000 mg | CHEWABLE_TABLET | Freq: Four times a day (QID) | ORAL | Status: DC | PRN

## 2020-05-27 MED ORDER — DOXYCYCLINE HYCLATE 100 MG PO TABS
100.0000 mg | ORAL_TABLET | Freq: Two times a day (BID) | ORAL | Status: DC
  Administered 2020-05-27 – 2020-05-28 (×2): 100 mg via ORAL
  Filled 2020-05-27 (×2): qty 1

## 2020-05-27 MED ORDER — CYCLOBENZAPRINE HCL 10 MG PO TABS: 10.0000 mg | ORAL_TABLET | Freq: Three times a day (TID) | ORAL | Status: DC | PRN

## 2020-05-27 MED ORDER — OXYCODONE HCL 5 MG PO TABS
5.0000 mg | ORAL_TABLET | ORAL | Status: DC | PRN
  Administered 2020-05-27: 5 mg via ORAL
  Administered 2020-05-28 (×2): 10 mg via ORAL
  Filled 2020-05-27: qty 1
  Filled 2020-05-27 (×2): qty 2
  Filled 2020-05-27: qty 1

## 2020-05-27 MED ORDER — OXYCODONE HCL 5 MG/5ML PO SOLN: 5.0000 mg | Freq: Once | ORAL | Status: AC | PRN

## 2020-05-27 MED ORDER — ROCURONIUM BROMIDE 10 MG/ML (PF) SYRINGE
PREFILLED_SYRINGE | INTRAVENOUS | Status: DC | PRN
  Administered 2020-05-27 (×2): 10 mg via INTRAVENOUS
  Administered 2020-05-27: 60 mg via INTRAVENOUS
  Administered 2020-05-27: 20 mg via INTRAVENOUS

## 2020-05-27 MED ORDER — OXYCODONE HCL 5 MG PO TABS
5.0000 mg | ORAL_TABLET | Freq: Once | ORAL | Status: AC | PRN
  Administered 2020-05-27: 5 mg via ORAL

## 2020-05-27 MED ORDER — LIDOCAINE 2% (20 MG/ML) 5 ML SYRINGE
INTRAMUSCULAR | Status: DC | PRN
  Administered 2020-05-27: 1.5 mg/kg/h via INTRAVENOUS

## 2020-05-27 MED ORDER — DOCUSATE SODIUM 100 MG PO CAPS
100.0000 mg | ORAL_CAPSULE | Freq: Two times a day (BID) | ORAL | Status: DC
  Administered 2020-05-27 – 2020-05-28 (×2): 100 mg via ORAL
  Filled 2020-05-27 (×2): qty 1

## 2020-05-27 MED ORDER — KETAMINE HCL 10 MG/ML IJ SOLN
INTRAMUSCULAR | Status: DC | PRN
  Administered 2020-05-27: 30 mg via INTRAVENOUS

## 2020-05-27 MED ORDER — SUGAMMADEX SODIUM 500 MG/5ML IV SOLN
INTRAVENOUS | Status: AC
  Filled 2020-05-27: qty 5

## 2020-05-27 MED ORDER — SCOPOLAMINE 1 MG/3DAYS TD PT72: 1.0000 | MEDICATED_PATCH | TRANSDERMAL | Status: DC

## 2020-05-27 MED ORDER — LACTATED RINGERS IV SOLN: INTRAVENOUS | Status: DC

## 2020-05-27 MED ORDER — ONDANSETRON HCL 4 MG/2ML IJ SOLN
INTRAMUSCULAR | Status: DC | PRN
  Administered 2020-05-27: 4 mg via INTRAVENOUS

## 2020-05-27 MED ORDER — INDOCYANINE GREEN 25 MG IV SOLR
2.5000 mg | Freq: Once | INTRAVENOUS | Status: AC
  Administered 2020-05-27: 2.5 mg via INTRAVENOUS
  Filled 2020-05-27: qty 1

## 2020-05-27 MED ORDER — DEXAMETHASONE SODIUM PHOSPHATE 10 MG/ML IJ SOLN
INTRAMUSCULAR | Status: AC
  Filled 2020-05-27: qty 1

## 2020-05-27 MED ORDER — BUPIVACAINE-EPINEPHRINE 0.25% -1:200000 IJ SOLN
INTRAMUSCULAR | Status: DC | PRN
  Administered 2020-05-27: 30 mL

## 2020-05-27 MED ORDER — HYDROMORPHONE HCL 1 MG/ML IJ SOLN
0.2500 mg | INTRAMUSCULAR | Status: DC | PRN
  Administered 2020-05-27 (×4): 0.5 mg via INTRAVENOUS

## 2020-05-27 MED ORDER — MORPHINE SULFATE (PF) 2 MG/ML IV SOLN: 1.0000 mg | INTRAVENOUS | Status: DC | PRN

## 2020-05-27 MED ORDER — PANTOPRAZOLE SODIUM 40 MG IV SOLR
40.0000 mg | Freq: Every day | INTRAVENOUS | Status: DC
  Administered 2020-05-27: 40 mg via INTRAVENOUS
  Filled 2020-05-27: qty 40

## 2020-05-27 MED ORDER — LISINOPRIL-HYDROCHLOROTHIAZIDE 20-25 MG PO TABS: 1.0000 | ORAL_TABLET | Freq: Every day | ORAL | Status: DC

## 2020-05-27 MED ORDER — LIDOCAINE 2% (20 MG/ML) 5 ML SYRINGE
INTRAMUSCULAR | Status: DC | PRN
  Administered 2020-05-27: 60 mg via INTRAVENOUS

## 2020-05-27 MED ORDER — SUCCINYLCHOLINE CHLORIDE 200 MG/10ML IV SOSY
PREFILLED_SYRINGE | INTRAVENOUS | Status: AC
  Filled 2020-05-27: qty 10

## 2020-05-27 MED ORDER — PROPOFOL 10 MG/ML IV BOLUS
INTRAVENOUS | Status: DC | PRN
  Administered 2020-05-27: 180 mg via INTRAVENOUS

## 2020-05-27 MED ORDER — LIDOCAINE HCL (PF) 2 % IJ SOLN
INTRAMUSCULAR | Status: AC
  Filled 2020-05-27: qty 5

## 2020-05-27 MED ORDER — ORAL CARE MOUTH RINSE: 15.0000 mL | Freq: Once | OROMUCOSAL | Status: AC

## 2020-05-27 MED ORDER — KETOROLAC TROMETHAMINE 30 MG/ML IJ SOLN: 30.0000 mg | Freq: Three times a day (TID) | INTRAMUSCULAR | Status: DC | PRN

## 2020-05-27 MED ORDER — FLUTICASONE PROPIONATE 50 MCG/ACT NA SUSP
1.0000 | Freq: Every day | NASAL | Status: DC | PRN
  Filled 2020-05-27: qty 16

## 2020-05-27 MED ORDER — HYDROCHLOROTHIAZIDE 25 MG PO TABS
25.0000 mg | ORAL_TABLET | Freq: Every day | ORAL | Status: DC
Start: 2020-05-28 — End: 2020-05-28
  Administered 2020-05-28: 25 mg via ORAL
  Filled 2020-05-27: qty 1

## 2020-05-27 MED ORDER — ACYCLOVIR 400 MG PO TABS
400.0000 mg | ORAL_TABLET | Freq: Two times a day (BID) | ORAL | Status: DC | PRN
Start: 2020-05-27 — End: 2020-05-28

## 2020-05-27 MED ORDER — PHENYLEPHRINE 40 MCG/ML (10ML) SYRINGE FOR IV PUSH (FOR BLOOD PRESSURE SUPPORT)
PREFILLED_SYRINGE | INTRAVENOUS | Status: DC | PRN
  Administered 2020-05-27: 80 ug via INTRAVENOUS

## 2020-05-27 MED ORDER — MIDAZOLAM HCL 5 MG/5ML IJ SOLN
INTRAMUSCULAR | Status: DC | PRN
  Administered 2020-05-27: 2 mg via INTRAVENOUS

## 2020-05-27 MED ORDER — INDOCYANINE GREEN 25 MG IV SOLR: 2.5000 mg | Freq: Once | INTRAVENOUS | Status: AC

## 2020-05-27 MED ORDER — BUPIVACAINE-EPINEPHRINE (PF) 0.25% -1:200000 IJ SOLN
INTRAMUSCULAR | Status: AC
  Filled 2020-05-27: qty 30

## 2020-05-27 MED ORDER — ENSURE PRE-SURGERY PO LIQD: 296.0000 mL | Freq: Once | ORAL | Status: DC

## 2020-05-27 MED ORDER — SUGAMMADEX SODIUM 200 MG/2ML IV SOLN
INTRAVENOUS | Status: DC | PRN
  Administered 2020-05-27: 300 mg via INTRAVENOUS

## 2020-05-27 MED ORDER — ONDANSETRON 4 MG PO TBDP: 4.0000 mg | ORAL_TABLET | Freq: Four times a day (QID) | ORAL | Status: DC | PRN

## 2020-05-27 MED ORDER — LIDOCAINE HCL 2 % IJ SOLN
INTRAMUSCULAR | Status: AC
  Filled 2020-05-27: qty 20

## 2020-05-27 MED ORDER — SCOPOLAMINE 1 MG/3DAYS TD PT72
MEDICATED_PATCH | TRANSDERMAL | Status: AC
  Administered 2020-05-27: 1.5 mg via TRANSDERMAL
  Filled 2020-05-27: qty 1

## 2020-05-27 MED ORDER — ENOXAPARIN SODIUM 40 MG/0.4ML ~~LOC~~ SOLN
40.0000 mg | SUBCUTANEOUS | Status: DC
  Administered 2020-05-28: 40 mg via SUBCUTANEOUS
  Filled 2020-05-27: qty 0.4

## 2020-05-27 SURGICAL SUPPLY — 70 items
APL PRP STRL LF DISP 70% ISPRP (MISCELLANEOUS) ×1
APPLIER CLIP 5 13 M/L LIGAMAX5 (MISCELLANEOUS)
APR CLP MED LRG 5 ANG JAW (MISCELLANEOUS)
BAG SPEC RTRVL 10 TROC 200 (ENDOMECHANICALS)
BLADE SURG SZ11 CARB STEEL (BLADE) ×2 IMPLANT
CANNULA REDUC XI 12-8 STAPL (CANNULA) ×2
CANNULA REDUCER 12-8 DVNC XI (CANNULA) ×1 IMPLANT
CATH MALECOT BARD  24FR (CATHETERS) ×2
CATH MALECOT BARD 24FR (CATHETERS) IMPLANT
CHLORAPREP W/TINT 26 (MISCELLANEOUS) ×2 IMPLANT
CLIP APPLIE 5 13 M/L LIGAMAX5 (MISCELLANEOUS) IMPLANT
CLIP VESOLOCK LG 6/CT PURPLE (CLIP) ×3 IMPLANT
CLSR STERI-STRIP ANTIMIC 1/2X4 (GAUZE/BANDAGES/DRESSINGS) ×1 IMPLANT
COVER TIP SHEARS 8 DVNC (MISCELLANEOUS) ×1 IMPLANT
COVER TIP SHEARS 8MM DA VINCI (MISCELLANEOUS) ×2
COVER WAND RF STERILE (DRAPES) ×2 IMPLANT
DECANTER SPIKE VIAL GLASS SM (MISCELLANEOUS) ×2 IMPLANT
DRAPE ARM DVNC X/XI (DISPOSABLE) ×4 IMPLANT
DRAPE COLUMN DVNC XI (DISPOSABLE) ×1 IMPLANT
DRAPE DA VINCI XI ARM (DISPOSABLE) ×8
DRAPE DA VINCI XI COLUMN (DISPOSABLE) ×2
DRSG TEGADERM 2-3/8X2-3/4 SM (GAUZE/BANDAGES/DRESSINGS) ×4 IMPLANT
DRSG TEGADERM 4X4.75 (GAUZE/BANDAGES/DRESSINGS) ×2 IMPLANT
ELECT REM PT RETURN 15FT ADLT (MISCELLANEOUS) ×2 IMPLANT
ENDOLOOP SUT PDS II  0 18 (SUTURE) ×4
ENDOLOOP SUT PDS II 0 18 (SUTURE) IMPLANT
EVACUATOR SILICONE 100CC (DRAIN) ×1 IMPLANT
GAUZE SPONGE 2X2 8PLY STRL LF (GAUZE/BANDAGES/DRESSINGS) ×1 IMPLANT
GLOVE INDICATOR 8.0 STRL GRN (GLOVE) ×4 IMPLANT
GLOVE SURG ENC MOIS LTX SZ7.5 (GLOVE) ×4 IMPLANT
GOWN STRL REUS W/TWL XL LVL3 (GOWN DISPOSABLE) ×4 IMPLANT
GRASPER SUT TROCAR 14GX15 (MISCELLANEOUS) ×1 IMPLANT
IRRIGATOR SUCT 8 DISP DVNC XI (IRRIGATION / IRRIGATOR) IMPLANT
IRRIGATOR SUCTION 8MM XI DISP (IRRIGATION / IRRIGATOR) ×2
KIT BASIN OR (CUSTOM PROCEDURE TRAY) ×2 IMPLANT
KIT PROCEDURE DA VINCI SI (MISCELLANEOUS)
KIT PROCEDURE DVNC SI (MISCELLANEOUS) IMPLANT
KIT TURNOVER KIT A (KITS) ×2 IMPLANT
MANIFOLD NEPTUNE II (INSTRUMENTS) ×2 IMPLANT
NDL INSUFFLATION 14GA 120MM (NEEDLE) IMPLANT
NEEDLE HYPO 22GX1.5 SAFETY (NEEDLE) ×2 IMPLANT
NEEDLE INSUFFLATION 14GA 120MM (NEEDLE) IMPLANT
PACK CARDIOVASCULAR III (CUSTOM PROCEDURE TRAY) ×2 IMPLANT
POUCH RETRIEVAL ECOSAC 10 (ENDOMECHANICALS) ×1 IMPLANT
POUCH RETRIEVAL ECOSAC 10MM (ENDOMECHANICALS)
SEAL CANN UNIV 5-8 DVNC XI (MISCELLANEOUS) ×4 IMPLANT
SEAL XI 5MM-8MM UNIVERSAL (MISCELLANEOUS) ×8
SET IRRIG TUBING LAPAROSCOPIC (IRRIGATION / IRRIGATOR) ×1 IMPLANT
SOL ANTI FOG 6CC (MISCELLANEOUS) ×1 IMPLANT
SOLUTION ANTI FOG 6CC (MISCELLANEOUS) ×1
SOLUTION ELECTROLUBE (MISCELLANEOUS) ×2 IMPLANT
SPONGE DRAIN TRACH 4X4 STRL 2S (GAUZE/BANDAGES/DRESSINGS) ×1 IMPLANT
SPONGE GAUZE 2X2 8PLY STRL LF (GAUZE/BANDAGES/DRESSINGS) ×1 IMPLANT
SPONGE GAUZE 2X2 STER 10/PKG (GAUZE/BANDAGES/DRESSINGS) ×1
SPONGE LAP 18X18 RF (DISPOSABLE) ×2 IMPLANT
STAPLER CANNULA SEAL DVNC XI (STAPLE) IMPLANT
STAPLER CANNULA SEAL XI (STAPLE)
STRIP CLOSURE SKIN 1/2X4 (GAUZE/BANDAGES/DRESSINGS) ×2 IMPLANT
SUT ETHILON 2 0 PS N (SUTURE) ×1 IMPLANT
SUT MNCRL AB 4-0 PS2 18 (SUTURE) ×2 IMPLANT
SUT VICRYL 0 TIES 12 18 (SUTURE) IMPLANT
SUT VICRYL 0 UR6 27IN ABS (SUTURE) ×2 IMPLANT
SYR 10ML ECCENTRIC (SYRINGE) ×2 IMPLANT
SYR 20ML LL LF (SYRINGE) ×2 IMPLANT
SYS RETRIEVAL 5MM INZII UNIV (BASKET) ×2
SYSTEM RETRIEVL 5MM INZII UNIV (BASKET) IMPLANT
TOWEL OR 17X26 10 PK STRL BLUE (TOWEL DISPOSABLE) ×2 IMPLANT
TOWEL OR NON WOVEN STRL DISP B (DISPOSABLE) ×2 IMPLANT
TROCAR BLADELESS OPT 5 100 (ENDOMECHANICALS) ×1 IMPLANT
TUBING INSUFFLATION 10FT LAP (TUBING) ×2 IMPLANT

## 2020-05-27 NOTE — Anesthesia Procedure Notes (Signed)
Procedure Name: Intubation Date/Time: 05/27/2020 10:25 AM Performed by: Maxwell Caul, CRNA Pre-anesthesia Checklist: Patient identified, Emergency Drugs available, Suction available and Patient being monitored Patient Re-evaluated:Patient Re-evaluated prior to induction Oxygen Delivery Method: Circle system utilized Preoxygenation: Pre-oxygenation with 100% oxygen Induction Type: IV induction Ventilation: Mask ventilation without difficulty Laryngoscope Size: Mac and 4 Grade View: Grade I Tube type: Oral Tube size: 7.5 mm Number of attempts: 1 Airway Equipment and Method: Stylet Placement Confirmation: ETT inserted through vocal cords under direct vision,  positive ETCO2 and breath sounds checked- equal and bilateral Secured at: 21 cm Tube secured with: Tape Dental Injury: Teeth and Oropharynx as per pre-operative assessment

## 2020-05-27 NOTE — Brief Op Note (Signed)
05/27/2020  1:19 PM  PATIENT:  Tina Ray  50 y.o. female  PRE-OPERATIVE DIAGNOSIS:  symptomatic cholelithiasis  POST-OPERATIVE DIAGNOSIS:  symptomatic cholelithiasis + probable acute calculous cholecystitis  PROCEDURE:  Procedure(s): XI ROBOTIC ASSISTED LAPAROSCOPIC CHOLECYSTECTOMY with ICG fluorescence Laparoscopic bilateral TAP block  SURGEON:  Surgeon(s) and Role:    Greer Pickerel, MD - Primary  PHYSICIAN ASSISTANT:   ASSISTANTS: none   ANESTHESIA:   general  EBL:  25 mL   BLOOD ADMINISTERED:none  DRAINS: (18) Jackson-Pratt drain(s) with closed bulb suction in the RUQ   LOCAL MEDICATIONS USED:  MARCAINE    and OTHER exparel  SPECIMEN:  Source of Specimen:  gallbladder  DISPOSITION OF SPECIMEN:  PATHOLOGY  COUNTS:  YES  TOURNIQUET:  * No tourniquets in log *  DICTATION: .Other Dictation: Dictation Number [id number]  PLAN OF CARE: Admit for overnight observation  PATIENT DISPOSITION:  PACU - hemodynamically stable.   Delay start of Pharmacological VTE agent (>24hrs) due to surgical blood loss or risk of bleeding: no  Leighton Ruff. Redmond Pulling, MD, FACS General, Bariatric, & Minimally Invasive Surgery [facility], [place]

## 2020-05-27 NOTE — Transfer of Care (Signed)
Immediate Anesthesia Transfer of Care Note  Patient: Tina Ray  Procedure(s) Performed: XI ROBOTIC ASSISTED LAPAROSCOPIC CHOLECYSTECTOMY WITH INTRAOPERATIVE CHOLANGIOGRAM (N/A )  Patient Location: PACU  Anesthesia Type:General  Level of Consciousness: awake, alert  and oriented  Airway & Oxygen Therapy: Patient Spontanous Breathing and Patient connected to face mask oxygen  Post-op Assessment: Report given to RN and Post -op Vital signs reviewed and stable  Post vital signs: Reviewed and stable  Last Vitals:  Vitals Value Taken Time  BP 137/73 05/27/20 1324  Temp 36.6 C 05/27/20 1324  Pulse 95 05/27/20 1327  Resp 13 05/27/20 1327  SpO2 100 % 05/27/20 1327  Vitals shown include unvalidated device data.  Last Pain:  Vitals:   05/27/20 0839  TempSrc: Oral  PainSc:          Complications: No complications documented.

## 2020-05-27 NOTE — Interval H&P Note (Signed)
History and Physical Interval Note:  05/27/2020 9:55 AM  [name]  has presented today for surgery, with the diagnosis of symptomatic cholelithiasis.  The various methods of treatment have been discussed with the patient and family. After consideration of risks, benefits and other options for treatment, the patient has consented to  Procedure(s): XI ROBOTIC ASSISTED LAPAROSCOPIC CHOLECYSTECTOMY WITH INTRAOPERATIVE CHOLANGIOGRAM (N/A) as a surgical intervention.  The patient's history has been reviewed, patient examined, no change in status, stable for surgery.  I have reviewed the patient's chart and labs.  Questions were answered to the patient's satisfaction.    Discussed the use of the robotic platform All questions asked and answered Tina Ray

## 2020-05-27 NOTE — Progress Notes (Deleted)
Holding lovenox prophylaxis til Wednesday morning due to some areas of oozing along the staple line (controlled by end of procedure)  Leighton Ruff. Redmond Pulling, MD, FACS General, Bariatric, & Minimally Invasive Surgery [facility], [place]

## 2020-05-27 NOTE — Op Note (Signed)
NAME: Tina, Ray MEDICAL RECORD [id number] ACCOUNT [id number] DATE OF BIRTH:1970-09-23 FACILITY: [facility] LOCATION: [facility] PHYSICIAN:Itamar Mcgowan Ronnie Derby, MD  OPERATIVE REPORT  DATE OF PROCEDURE:  05/27/2020  PREOPERATIVE DIAGNOSIS:  Symptomatic cholelithiasis.  POSTOPERATIVE DIAGNOSIS:  Symptomatic cholelithiasis plus probable acute calculous cholecystitis.  PROCEDURES: 1.  Xi Robotic-assisted laparoscopic cholecystectomy with ICG fluorescence. 2.  Laparoscopic bilateral TAP block.  SURGEON:  Greer Pickerel, M.D.  ASSISTANT SURGEON:  None.  ANESTHESIA:  General.  ESTIMATED BLOOD LOSS:  25 mL.  DRAINS:  An 18-French round drain in the right upper quadrant.  LOCAL:  Marcaine and Exparel mixture.  SPECIMENS:  Gallbladder.  INDICATIONS FOR THE PROCEDURE:  The patient is a pleasant 50 year old female who had an episode of severe epigastric pain radiating to her back around Christmas.  She went to the emergency room and had normal labs, but did have a CT show cholelithiasis  as well as haziness around the gallbladder.  She was referred for evaluation.  It felt that her symptoms were consistent with cholelithiasis and she agreed to proceed with surgery.  DESCRIPTION OF PROCEDURE:  The patient was given oral Tylenol and gabapentin prior to surgery.  She was then taken to OR-2 at [facility] and placed supine on the operating room table.  General endotracheal anesthesia was established.   Sequential compression devices were placed.  She was taped to the OR table with the appropriate padding.  Her abdomen was prepped and draped in the usual standard surgical fashion with ChloraPrep.  She received IV antibiotic prior to skin incision.   Surgical timeout was performed.  The patient has had a prior robotic hysterectomy and pelvic biopsies for her remote history of cancer.  She had some lower abdominal old trocar sites.  A small vertical incision was made at the umbilicus.  The fascia  was  incised with the scalpel.  The fascial edges were grasped with Kochers and lifted up and the abdominal cavity was entered.  A finger sweep was done and there were no adhesions.  A pursestring suture was placed around the fascial edges with a 0 Vicryl  followed by placement of an 8 mm robotic Hasson cannula and secured with the pursestring suture.  The peritoneum was then created with CO2 up to a patient pressure of 15 mmHg without any patient changes in vital signs.  The robotic camera was inserted.   The abdominal cavity was surveilled.  There was no evidence of injury to surrounding structures.  The patient was placed in reverse Trendelenburg and rotated to the patient's left.  I then went about placing three additional robotic 8 mm trocars in a  horizontal line in the lower abdomen in line with the umbilical trocar.  I was able to use some of her old robotic trocar sites.  Two were placed on the right side of the abdomen and one in the left lower quadrant, all under direct visualization.  I then  performed a bilateral lateral abdominal wall TAP block.  We then brought the [employer] robot and deployed it for upper abdominal surgery from the patient's left.  The robotic camera was attached to the trocar 2 and the anatomy was targeted.  The robot  arms were then attached to the robotic trocars.  A fenestrated bipolar was placed in arm 1 and tip up was placed in arm 4 and robotic hook in arm 3.  I then scrubbed out and went to the robotic console with the scrub technician staying  at the bedside.   The gallbladder was identified and the fundus was grasped with the tip up and retracted cephalad.  I did not fully retract at this point because there is a fair amount of omental adhesions encasing the fundus and body of the gallbladder.  I took down  these adhesions with hook robotic cautery.  I was then able to grasp and retract the gallbladder further toward the right shoulder.  I then grasped the  infundibulum and retracted laterally.  We continued to take down the omental adhesions with hook  electrocautery as well as some gentle blunt dissection with the backside of the hook without energy.  The patient had a gallbladder that was thick walled and appeared at least chronically inflamed and probably acutely inflamed.  There were stones in the  infundibulum, which made it a little bit challenging to retract the infundibulum laterally.  I ended up changing out the fenestrated bipolar for ProGrasps to aid with retraction of the infundibulum.  The triangle of Calot was identified.  Firefly  immunofluorescence was activated and I confirmed the location of the cystic duct as well as the common duct.  Her cystic duct was dilated.  I then took down the lateral attachments and some of the medial attachments of the peritoneum of the gallbladder  with hook electrocautery.  Her cystic artery was crossing anterior to her cystic duct as it entered the infundibulum.  In order to facilitate better visualization of the confluence of the cystic duct and infundibulum I felt I needed to go ahead and take  the cystic artery.  It was circumferentially dissected out and around with the aid of the hook.  It was clearly entering the gallbladder.  Two clips were placed on the downside of the cystic artery and then it was transected distally with hook  electrocautery.  I then continued dissecting with some gentle blunt dissection as well as with hook electrocautery using the hook as dissection until around the confluence of the cystic duct and the infundibulum.  We ended up using the robotic suction  irrigator catheter as well to do some blunt sweeping down of the peritoneum.  Again, switched back and forth to [facility] immunofluorescence as needed.  The patient again had a fair amount of some chronic inflammation as well as acute inflammation in this  area.  I felt it best to stay at the confluence of the cystic duct and the  infundibulum.  I was eventually able to circumferentially get around the gallbladder at the infundibulum.  The cystic duct was again dilated.  Unfortunately, a defect was made  in the body of the gallbladder at the infundibulum and there was some stone spillage.  I ended up, I felt the easiest thing to do at this point, since the cystic duct and the infundibulum would not accommodate a robotic clip or a typical laparoscopic  clip was to use a PDS Endoloop to secure essentially the cystic duct stump as it entered the gallbladder.  The infundibulum was transected with a robotic hook ended up mobilizing the rest of the gallbladder above the gallbladder fossa and completely  detach the gallbladder from the gallbladder fossa.  At this point, we undocked the robot and I scrubbed back in and applied specimen bag, which was advanced through the Hasson trocar.  I placed the gallbladder & all the spilled gallstones in the  specimen bag with the aid of a laparoscopic Prestige grasper.  We irrigated the right upper quadrant and  confirmed there were no additional spilled gallstones.  The specimen bag was left in the abdomen after it had been cinched down.  I then returned  robotically and we re-docked the robot to and attached three of the arms and I ended up lifting up the liver inspecting for hemostasis, which there was.  There was no evidence of bile leak or bleeding from the gallbladder fossa.  I used the tip-up  grasper to lift up the liver to expose the cystic duct stump, which was essentially had been transected at the junction of the infundibulum.  I then went back laparoscopically and scrubbed back in order to place the laparoscopic PDS Endoloops.  The  robotic trocars being in the lower abdomen where just a little bit too far away in order to do this with ease, so therefore, a 5 mm laparoscopic trocar was placed in the right upper quadrant.  The scrub technician held the robotic camera while I grasped  the cuff  of the long cystic duct stump with the [place] after I had gone through it with a PDS Endoloop.  I placed 2 PDS Endoloops across the cystic duct stump.  It was below the area where there had been an opening in the cystic duct.  I decided to  place a surgical drain just in case there may be a leak of bile from the cystic duct stump.  It was placed through the most lateral robotic right trocar, I put it in position in the right upper quadrant in the gallbladder fossa.  I then went back  robotically and removed the tip-up grasper that had been retracting the liver, prior to doing that I did one final visual inspection and there was no evidence of bleeding or bile leak.  I then scrubbed back in and we removed the applied specimen bag  through the umbilical incision.  The pursestring suture was tied down, thus obliterating the fascial defect.  I did place one additional interrupted 0 Vicryl with the PMI suture passer.  There was no air leak.  Exparel and Marcaine mixture was  infiltrated.  The drain was secured to the skin with a 2-0 nylon and connected to a grenade.  Skin incisions were then closed with a 4-0 Monocryl followed by application of Steri-Strips and sterile dressings.  All needle, instrument and sponge counts  were correct x2.  There were no immediate complications.  The patient tolerated the procedure well.  HN/NUANCE  D:05/27/2020 T:05/27/2020 JOB:[id number]

## 2020-05-27 NOTE — Anesthesia Preprocedure Evaluation (Signed)
Anesthesia Evaluation  Patient identified by MRN, date of birth, ID band Patient awake    Reviewed: Allergy & Precautions, NPO status , Patient's Chart, lab work & pertinent test results  Airway Mallampati: II  TM Distance: >3 FB Neck ROM: Full    Dental no notable dental hx. (+) Teeth Intact   Pulmonary sleep apnea ,  Uses mouth guard   Pulmonary exam normal breath sounds clear to auscultation       Cardiovascular hypertension, Pt. on medications + Peripheral Vascular Disease  Normal cardiovascular exam Rhythm:Regular Rate:Normal     Neuro/Psych Anxiety negative neurological ROS     GI/Hepatic negative GI ROS, Neg liver ROS,   Endo/Other  Obesity  Renal/GU negative Renal ROS  negative genitourinary   Musculoskeletal  (+) Arthritis , Osteoarthritis,    Abdominal (+) + obese,   Peds  Hematology   Anesthesia Other Findings   Reproductive/Obstetrics Hx/o endometrial Ca- S/P Hysterectomy HSV                             Anesthesia Physical Anesthesia Plan  ASA: II  Anesthesia Plan: General   Post-op Pain Management:    Induction: Intravenous  PONV Risk Score and Plan: 4 or greater and Midazolam, Scopolamine patch - Pre-op, Ondansetron, Dexamethasone and Treatment may vary due to age or medical condition  Airway Management Planned: Oral ETT  Additional Equipment:   Intra-op Plan:   Post-operative Plan: Extubation in OR  Informed Consent: I have reviewed the patients History and Physical, chart, labs and discussed the procedure including the risks, benefits and alternatives for the proposed anesthesia with the patient or authorized representative who has indicated his/her understanding and acceptance.     Dental advisory given  Plan Discussed with: CRNA and Anesthesiologist  Anesthesia Plan Comments:         Anesthesia Quick Evaluation

## 2020-05-27 NOTE — Anesthesia Postprocedure Evaluation (Signed)
Anesthesia Post Note  Patient: Tina Ray  Procedure(s) Performed: XI ROBOTIC ASSISTED LAPAROSCOPIC CHOLECYSTECTOMY WITH INTRAOPERATIVE CHOLANGIOGRAM (N/A )     Patient location during evaluation: PACU Anesthesia Type: General Level of consciousness: awake and alert and oriented Pain management: pain level controlled Vital Signs Assessment: post-procedure vital signs reviewed and stable Respiratory status: spontaneous breathing, nonlabored ventilation and respiratory function stable Cardiovascular status: blood pressure returned to baseline and stable Postop Assessment: no apparent nausea or vomiting Anesthetic complications: no   No complications documented.  Last Vitals:  Vitals:   05/27/20 1345 05/27/20 1400  BP: 134/84 139/87  Pulse: (!) 102 (!) 101  Resp: 15 16  Temp:    SpO2: 100% 100%    Last Pain:  Vitals:   05/27/20 1400  TempSrc:   PainSc: 5                  Oskar Cretella A.

## 2020-05-28 DIAGNOSIS — Z79899 Other long term (current) drug therapy: Secondary | ICD-10-CM | POA: Diagnosis not present

## 2020-05-28 DIAGNOSIS — Z6838 Body mass index (BMI) 38.0-38.9, adult: Secondary | ICD-10-CM | POA: Diagnosis not present

## 2020-05-28 DIAGNOSIS — I10 Essential (primary) hypertension: Secondary | ICD-10-CM | POA: Diagnosis not present

## 2020-05-28 DIAGNOSIS — E669 Obesity, unspecified: Secondary | ICD-10-CM | POA: Diagnosis not present

## 2020-05-28 DIAGNOSIS — K8020 Calculus of gallbladder without cholecystitis without obstruction: Secondary | ICD-10-CM | POA: Diagnosis not present

## 2020-05-28 DIAGNOSIS — K8010 Calculus of gallbladder with chronic cholecystitis without obstruction: Secondary | ICD-10-CM | POA: Diagnosis not present

## 2020-05-28 LAB — CBC
HCT: 40.7 % (ref 36.0–46.0)
Hemoglobin: 13.7 g/dL (ref 12.0–15.0)
MCH: 32.3 pg (ref 26.0–34.0)
MCHC: 33.7 g/dL (ref 30.0–36.0)
MCV: 96 fL (ref 80.0–100.0)
Platelets: 164 10*3/uL (ref 150–400)
RBC: 4.24 MIL/uL (ref 3.87–5.11)
RDW: 11.9 % (ref 11.5–15.5)
WBC: 9.2 10*3/uL (ref 4.0–10.5)
nRBC: 0 % (ref 0.0–0.2)

## 2020-05-28 LAB — COMPREHENSIVE METABOLIC PANEL
ALT: 29 U/L (ref 0–44)
AST: 30 U/L (ref 15–41)
Albumin: 4 g/dL (ref 3.5–5.0)
Alkaline Phosphatase: 55 U/L (ref 38–126)
Anion gap: 11 (ref 5–15)
BUN: 6 mg/dL (ref 6–20)
CO2: 25 mmol/L (ref 22–32)
Calcium: 9.2 mg/dL (ref 8.9–10.3)
Chloride: 103 mmol/L (ref 98–111)
Creatinine, Ser: 0.7 mg/dL (ref 0.44–1.00)
GFR, Estimated: >60 mL/min (ref >60)
Glucose, Bld: 142 mg/dL — ABNORMAL HIGH (ref 70–99)
Potassium: 4.2 mmol/L (ref 3.5–5.1)
Sodium: 139 mmol/L (ref 135–145)
Total Bilirubin: 1.2 mg/dL (ref 0.3–1.2)
Total Protein: 6.8 g/dL (ref 6.5–8.1)

## 2020-05-28 MED ORDER — ACETAMINOPHEN 500 MG PO TABS: 1000.0000 mg | ORAL_TABLET | Freq: Three times a day (TID) | ORAL | 0 refills | Status: AC

## 2020-05-28 MED ORDER — OXYCODONE HCL 5 MG PO TABS: 5.0000 mg | ORAL_TABLET | Freq: Four times a day (QID) | ORAL | 0 refills | Status: DC | PRN

## 2020-05-28 NOTE — Progress Notes (Signed)
Pt alert and oriented, tolerating diet. Drain instruction given. D/C instructions given. Pt d/cd home.

## 2020-05-28 NOTE — Discharge Summary (Signed)
Physician Discharge Summary  Tina Ray HUD:[id number] DOB: April 19, 1970 DOA: 05/27/2020  PCP: Orpah Melter, MD  Admit date: 05/27/2020 Discharge date: 05/28/2020  Recommendations for Outpatient Follow-up:     Follow-up Information    Surgery, [place]. Schedule an appointment as soon as possible for a visit in 6 day(s).   Specialty: General Surgery Why: for drain removal - our office will call with you appt time and date Contact information: [place] [address] [phone]        Greer Pickerel, MD. Schedule an appointment as soon as possible for a visit in 3 week(s).   Specialty: General Surgery Why: for postop check Contact information: [place] [address] [phone]              Discharge Diagnoses:  1. 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 obesity  Surgical Procedure: xi robotic cholecystectomy  Discharge Condition: good Disposition: home  Diet recommendation: low fat  Filed Weights   05/27/20 0806  Weight: 98.7 kg    History of present illness:  She was brought in for planned cholecystectomy due to symptomatic cholelithiasis  Hospital Course:  She was kept overnight for hospitalization and observation.  She ended up having rather chronic inflammation as well as acute inflammation of her gallbladder.  I had to place PDS Endoloops around her most distal cystic duct stump due to how dilated it was.  I was concerned for potential bile leak.  She was kept overnight for observation. No evidence of bile leak. Min pain just soreness. No n/v.   Discussed intraop findings, given diagram.   Will plan drain removal in clinic early next week.   BP (!) 146/83 (BP Location: Right Arm)   Pulse 73   Temp 98.3 F (36.8 C) (Oral)   Resp 16   Ht 5' 2.5" (1.588 m)   Wt 98.7 kg   LMP 07/04/2017 (Exact Date)   SpO2 97%   BMI 39.17 kg/m   Gen: alert, NAD, non-toxic appearing Pupils:  equal, no scleral icterus Pulm: Lungs clear to auscultation, symmetric chest rise CV: regular rate and rhythm Abd: soft, min tender, nondistended. jp-serosang. No cellulitis. No incisional hernia Ext: no edema, no calf tenderness Skin: no rash, no jaundice    Discharge Instructions  Discharge Instructions    Call MD for:   Complete by: As directed    Temperature >101   Call MD for:  hives   Complete by: As directed    Call MD for:  persistant dizziness or light-headedness   Complete by: As directed    Call MD for:  persistant nausea and vomiting   Complete by: As directed    Call MD for:  redness, tenderness, or signs of infection (pain, swelling, redness, odor or green/yellow discharge around incision site)   Complete by: As directed    Call MD for:  severe uncontrolled pain   Complete by: As directed    Diet general   Complete by: As directed    Discharge instructions   Complete by: As directed    See [facility] discharge instructions   Increase activity slowly   Complete by: As directed    Remove dressing in 24 hours   Complete by: As directed      Allergies as of 05/28/2020      Reactions   Erythromycin Base Hives   Sulfa Antibiotics Hives   Amoxicillin    Other reaction(s): Hives  Sulfamethoxazole-trimethoprim    Other reaction(s): Hives      Medication List    STOP taking these medications   venlafaxine XR 75 MG 24 hr capsule Commonly known as: EFFEXOR-XR     TAKE these medications   acetaminophen 500 MG tablet Commonly known as: TYLENOL Take 2 tablets (1,000 mg total) by mouth every 8 (eight) hours for 5 days.   acyclovir 400 MG tablet Commonly known as: ZOVIRAX Take 400 mg by mouth 2 (two) times daily as needed (outbreak).   BIOFREEZE EX Apply 1 application topically 2 (two) times daily as needed (back pain).   CALCIUM + D3 PO Take 1 tablet by mouth daily. gummies   cyclobenzaprine 10 MG tablet Commonly known as: FLEXERIL Take 10 mg by mouth 3  (three) times daily as needed for muscle spasms.   doxycycline 100 MG capsule Commonly known as: VIBRAMYCIN Take 100 mg by mouth 2 (two) times daily. Dr Greer Pickerel   Fish Oil 500 MG Caps Take 500 mg by mouth daily.   fluticasone 50 MCG/ACT nasal spray Commonly known as: FLONASE Place 1 spray into both nostrils daily as needed for allergies.   ibuprofen 200 MG tablet Commonly known as: ADVIL Take 400 mg by mouth 2 (two) times daily as needed for moderate pain.   lisinopril-hydrochlorothiazide 20-25 MG tablet Commonly known as: ZESTORETIC Take 1 tablet by mouth daily.   oxyCODONE 5 MG immediate release tablet Commonly known as: Oxy IR/ROXICODONE Take 1 tablet (5 mg total) by mouth every 6 (six) hours as needed for severe pain.   vitamin C 250 MG tablet Commonly known as: ASCORBIC ACID Take 250 mg by mouth daily.       Follow-up Information    Surgery, [place]. Schedule an appointment as soon as possible for a visit in 6 day(s).   Specialty: General Surgery Why: for drain removal - our office will call with you appt time and date Contact information: [place] [address] [phone]        Greer Pickerel, MD. Schedule an appointment as soon as possible for a visit in 3 week(s).   Specialty: General Surgery Why: for postop check Contact information: [place] [address] [phone]                The results of significant diagnostics from this hospitalization (including imaging, microbiology, ancillary and laboratory) are listed below for reference.    Significant Diagnostic Studies: MM 3D SCREEN BREAST BILATERAL  Result Date: 05/06/2020 CLINICAL DATA:  Screening. EXAM: DIGITAL SCREENING BILATERAL MAMMOGRAM WITH TOMO AND CAD COMPARISON:  Previous exam(s). ACR Breast Density Category c: The breast tissue is heterogeneously dense, which may obscure small masses. FINDINGS: There are no findings  suspicious for malignancy. The images were evaluated with computer-aided detection. IMPRESSION: No mammographic evidence of malignancy. A result letter of this screening mammogram will be mailed directly to the patient. RECOMMENDATION: Screening mammogram in one year. (Code:SM-B-01Y) BI-RADS CATEGORY  1: Negative. Electronically Signed   By: Claudie Revering M.D.   On: 05/06/2020 07:57    Microbiology: Recent Results (from the past 240 hour(s))  SARS CORONAVIRUS 2 (TAT 6-24 HRS) Nasopharyngeal Nasopharyngeal Swab     Status: None   Collection Time: 05/24/20 11:14 AM   Specimen: Nasopharyngeal Swab  Result Value Ref Range Status   SARS Coronavirus 2 NEGATIVE NEGATIVE Final    Comment: (NOTE) SARS-CoV-2 target nucleic acids are NOT DETECTED.  The SARS-CoV-2 RNA is generally detectable in upper and lower respiratory specimens during the acute phase of infection. Negative results do not preclude SARS-CoV-2 infection, do not rule out co-infections with other pathogens, and should not be used as the sole basis for treatment or other patient management decisions. Negative results must be combined with clinical observations, patient history, and epidemiological information. The expected result is Negative.  Fact Sheet for Patients: SugarRoll.be  Fact Sheet for Healthcare Providers: https://www.woods-mathews.com/  This test is not yet approved or cleared by the [place] FDA and  has been authorized for detection and/or diagnosis of SARS-CoV-2 by FDA under an Emergency Use Authorization (EUA). This EUA will remain  in effect (meaning this test can be used) for the duration of the COVID-19 declaration under Se ction 564(b)(1) of the Act, 21 U.S.C. section 360bbb-3(b)(1), unless the authorization is terminated or revoked sooner.  Performed at [facility], [place] [address] [place], [place] [address]      Labs: Basic Metabolic Panel: Recent  Labs  Lab 05/23/20 0936 05/28/20 0506  NA 138 139  K 4.2 4.2  CL 103 103  CO2 23 25  GLUCOSE 110* 142*  BUN 14 6  CREATININE 0.73 0.70  CALCIUM 9.4 9.2   Liver Function Tests: Recent Labs  Lab 05/23/20 0936 05/28/20 0506  AST 21 30  ALT 18 29  ALKPHOS 61 55  BILITOT 1.2 1.2  PROT 7.6 6.8  ALBUMIN 4.4 4.0   No results for input(s): LIPASE, AMYLASE in the last 168 hours. No results for input(s): AMMONIA in the last 168 hours. CBC: Recent Labs  Lab 05/23/20 0936 05/28/20 0506  WBC 6.5 9.2  NEUTROABS 3.9  --   HGB 16.1* 13.7  HCT 46.7* 40.7  MCV 94.2 96.0  PLT 198 164   Cardiac Enzymes: No results for input(s): CKTOTAL, CKMB, CKMBINDEX, TROPONINI in the last 168 hours. BNP: BNP (last 3 results) No results for input(s): BNP in the last 8760 hours.  ProBNP (last 3 results) No results for input(s): PROBNP in the last 8760 hours.  CBG: No results for input(s): GLUCAP in the last 168 hours.  Active Problems:   S/P cholecystectomy   Time coordinating discharge: 15 min  Signed:  Gayland Curry, MD [facility] Surgery, [place] [phone] 05/28/2020, 12:58 PM

## 2020-05-28 NOTE — Discharge Instructions (Signed)
[facility], P.A. LAPAROSCOPIC SURGERY: POST OP INSTRUCTIONS Always review your discharge instruction sheet given to you by the facility where your surgery was performed. IF YOU HAVE DISABILITY OR FAMILY LEAVE FORMS, YOU MUST BRING THEM TO THE OFFICE FOR PROCESSING.   DO NOT GIVE THEM TO YOUR DOCTOR.  PAIN CONTROL  1. First take acetaminophen (Tylenol) AND/or ibuprofen (Advil) to control your pain after surgery.  Follow directions on package.  Taking acetaminophen (Tylenol) and/or ibuprofen (Advil) regularly after surgery will help to control your pain and lower the amount of prescription pain medication you may need.  You should not take more than 3,000 mg (3 grams) of acetaminophen (Tylenol) in 24 hours.  You should not take ibuprofen (Advil), aleve, motrin, naprosyn or other NSAIDS if you have a history of stomach ulcers or chronic kidney disease.  2. A prescription for pain medication may be given to you upon discharge.  Take your pain medication as prescribed, if you still have uncontrolled pain after taking acetaminophen (Tylenol) or ibuprofen (Advil). 3. Use ice packs to help control pain. 4. If you need a refill on your pain medication, please contact your pharmacy.  They will contact our office to request authorization. Prescriptions will not be filled after 5pm or on week-ends.  HOME MEDICATIONS 5. Take your usually prescribed medications unless otherwise directed.  DIET 6. You should follow a light diet the first few days after arrival home.  Be sure to include lots of fluids daily. Avoid fatty, fried foods.   CONSTIPATION 7. It is common to experience some constipation after surgery and if you are taking pain medication.  Increasing fluid intake and taking a stool softener (such as Colace) will usually help or prevent this problem from occurring.  A mild laxative (Milk of Magnesia or Miralax) should be taken according to package instructions if there are no bowel  movements after 48 hours.  WOUND/INCISION CARE 8. Most patients will experience some swelling and bruising in the area of the incisions.  Ice packs will help.  Swelling and bruising can take several days to resolve.  9. Unless discharge instructions indicate otherwise, follow guidelines below  a. STERI-STRIPS - you may remove your outer bandages 48 hours after surgery, and you may shower at that time.  You have steri-strips (small skin tapes) in place directly over the incision.  These strips should be left on the skin for 7-10 days.   b. DERMABOND/SKIN GLUE - you may shower in 24 hours.  The glue will flake off over the next 2-3 weeks. 10. Any sutures or staples will be removed at the office during your follow-up visit.  ACTIVITIES 11. You may resume regular (light) daily activities beginning the next day--such as daily self-care, walking, climbing stairs--gradually increasing activities as tolerated.  You may have sexual intercourse when it is comfortable.  Refrain from any heavy lifting or straining until approved by your doctor. a. You may drive when you are no longer taking prescription pain medication, you can comfortably wear a seatbelt, and you can safely maneuver your car and apply brakes.  FOLLOW-UP 12. You should see your doctor in the office for a follow-up appointment approximately 2-3 weeks after your surgery.  You should have been given your post-op/follow-up appointment when your surgery was scheduled.  If you did not receive a post-op/follow-up appointment, make sure that you call for this appointment within a day or two after you arrive home to insure a convenient appointment time.  OTHER  INSTRUCTIONS 13. See JP instructions below  WHEN TO CALL YOUR DOCTOR: 1. Fever over 101.0 2. Inability to urinate 3. Continued bleeding from incision. 4. Increased pain, redness, or drainage from the incision. 5. Increasing abdominal pain  The clinic staff is available to answer your  questions during regular business hours.  Please don't hesitate to call and ask to speak to one of the nurses for clinical concerns.  If you have a medical emergency, go to the nearest emergency room or call 911.  A surgeon from [facility] is always on call at the hospital. [address], [place], [place], [place]  [address] ? [address], [place], [place]   [address] [phone] ? [phone] ? FAX [phone] Web site: [email or web address]   [facility] Surgical drains are used to remove extra fluid that normally builds up in a surgical wound after surgery. A surgical drain helps to heal a surgical wound. Different kinds of surgical drains include:  Active drains. These drains use suction to pull drainage away from the surgical wound. Drainage flows through a tube to a container outside of the body. With these drains, you need to keep the bulb or the drainage container flat (compressed) at all times, except while you empty it. Flattening the bulb or container creates suction.  Passive drains. These drains allow fluid to drain naturally, by gravity. Drainage flows through a tube to a bandage (dressing) or a container outside of the body. Passive drains do not need to be emptied. A drain is placed during surgery. Right after surgery, drainage is usually bright red and a little thicker than water. The drainage may gradually turn yellow or pink and become thinner. It is likely that your health care provider will remove the drain when the drainage stops or when the amount decreases to 1-2 Tbsp (15-30 mL) during a 24-hour period. Supplies needed:  Tape.  Germ-free cleaning solution (sterile saline).  Cotton swabs.  Split gauze drain sponge: 4 x 4 inches (10 x 10 cm).  Gauze square: 4 x 4 inches (10 x 10 cm). How to care for your surgical drain Care for your drain as told by your health care provider. This is important to help prevent infection.  If your drain is placed at your back, or any other hard-to-reach area, ask another person to assist you in performing the following tasks: General care  Keep the skin around the drain dry and covered with a dressing at all times.  Check your drain area every day for signs of infection. Check for: ? Redness, swelling, or pain. ? Pus or a bad smell. ? Cloudy drainage. ? Tenderness or pressure at the drain exit site. Changing the dressing Follow instructions from your health care provider about how to change your dressing. Change your dressing at least once a day. Change it more often if needed to keep the dressing dry. Make sure you: 1. Gather your supplies. 2. Wash your hands with soap and water before you change your dressing. If soap and water are not available, use hand sanitizer. 3. Remove the old dressing. Avoid using scissors to do that. 4. Wash your hands with soap and water again after removing the old dressing. 5. Use sterile saline to clean your skin around the drain. You may need to use a cotton swab to clean the skin. 6. Place the tube through the slit in a drain sponge. Place the drain sponge so that it covers your wound. 7.  Place the gauze square or another drain sponge on top of the drain sponge that is on the wound. Make sure the tube is between those layers. 8. Tape the dressing to your skin. 9. Tape the drainage tube to your skin 1-2 inches (2.5-5 cm) below the place where the tube enters your body. Taping keeps the tube from pulling on any stitches (sutures) that you have. 10. Wash your hands with soap and water. 11. Write down the color of your drainage and how often you change your dressing. How to empty your active drain 1. Make sure that you have a measuring cup that you can empty your drainage into. 2. Wash your hands with soap and water. If soap and water are not available, use hand sanitizer. 3. Loosen any pins or clips that hold the tube in place. 4. If your  health care provider tells you to strip the tube to prevent clots and tube blockages: ? Hold the tube at the skin with one hand. Use your other hand to pinch the tubing with your thumb and first finger. ? Gently move your fingers down the tube while squeezing very lightly. This clears any drainage, clots, or tissue from the tube. ? You may need to do this several times each day to keep the tube clear. Do not pull on the tube. 5. Open the bulb cap or the drain plug. Do not touch the inside of the cap or the bottom of the plug. 6. Turn the device upside down and gently squeeze. 7. Empty all of the drainage into the measuring cup. 8. Compress the bulb or the container and replace the cap or the plug. To compress the bulb or the container, squeeze it firmly in the middle while you close the cap or plug the container. 9. Write down the amount of drainage that you have in each 24-hour period. If you have less than 2 Tbsp (30 mL) of drainage during 24 hours, contact your health care provider. 10. Flush the drainage down the toilet. 11. Wash your hands with soap and water.   Contact a health care provider if:  You have redness, swelling, or pain around your drain area.  You have pus or a bad smell coming from your drain area.  You have a fever or chills.  The skin around your drain is warm to the touch.  The amount of drainage that you have is increasing instead of decreasing.  You have drainage that is cloudy.  There is a sudden stop or a sudden decrease in the amount of drainage that you have.  Your drain tube falls out.  Your active drain does not stay compressed after you empty it. Summary  Surgical drains are used to remove extra fluid that normally builds up in a surgical wound after surgery.  Different kinds of surgical drains include active drains and passive drains. Active drains use suction to pull drainage away from the surgical wound, and passive drains allow fluid to drain  naturally.  It is important to care for your drain to prevent infection. If your drain is placed at your back, or any other hard-to-reach area, ask another person to assist you.  Contact your health care provider if you have redness, swelling, or pain around your drain area. This information is not intended to replace advice given to you by your health care provider. Make sure you discuss any questions you have with your health care provider. Document Revised: 04/26/2018 Document Reviewed: 04/26/2018 Elsevier Patient  Education  2021 [employer].

## 2020-05-29 ENCOUNTER — Other Ambulatory Visit (HOSPITAL_COMMUNITY): Payer: Federal, State, Local not specified - PPO

## 2020-05-29 LAB — SURGICAL PATHOLOGY

## 2020-09-18 DIAGNOSIS — Z0000 Encounter for general adult medical examination without abnormal findings: Secondary | ICD-10-CM | POA: Diagnosis not present

## 2020-09-18 DIAGNOSIS — Z13220 Encounter for screening for lipoid disorders: Secondary | ICD-10-CM | POA: Diagnosis not present

## 2020-09-18 DIAGNOSIS — I10 Essential (primary) hypertension: Secondary | ICD-10-CM | POA: Diagnosis not present

## 2020-09-18 DIAGNOSIS — R7303 Prediabetes: Secondary | ICD-10-CM | POA: Diagnosis not present

## 2020-09-18 DIAGNOSIS — E669 Obesity, unspecified: Secondary | ICD-10-CM | POA: Diagnosis not present

## 2020-10-14 DIAGNOSIS — M25569 Pain in unspecified knee: Secondary | ICD-10-CM | POA: Diagnosis not present

## 2020-10-20 DIAGNOSIS — L738 Other specified follicular disorders: Secondary | ICD-10-CM | POA: Diagnosis not present

## 2020-10-20 DIAGNOSIS — L905 Scar conditions and fibrosis of skin: Secondary | ICD-10-CM | POA: Diagnosis not present

## 2020-10-20 DIAGNOSIS — L814 Other melanin hyperpigmentation: Secondary | ICD-10-CM | POA: Diagnosis not present

## 2020-10-20 DIAGNOSIS — D1801 Hemangioma of skin and subcutaneous tissue: Secondary | ICD-10-CM | POA: Diagnosis not present

## 2020-11-11 DIAGNOSIS — Z124 Encounter for screening for malignant neoplasm of cervix: Secondary | ICD-10-CM | POA: Diagnosis not present

## 2020-11-11 DIAGNOSIS — Z130 Encounter for screening for diseases of the blood and blood-forming organs and certain disorders involving the immune mechanism: Secondary | ICD-10-CM | POA: Diagnosis not present

## 2020-11-11 DIAGNOSIS — E559 Vitamin D deficiency, unspecified: Secondary | ICD-10-CM | POA: Diagnosis not present

## 2020-11-11 DIAGNOSIS — Z1151 Encounter for screening for human papillomavirus (HPV): Secondary | ICD-10-CM | POA: Diagnosis not present

## 2020-11-11 DIAGNOSIS — Z01419 Encounter for gynecological examination (general) (routine) without abnormal findings: Secondary | ICD-10-CM | POA: Diagnosis not present

## 2020-11-11 DIAGNOSIS — Z13228 Encounter for screening for other metabolic disorders: Secondary | ICD-10-CM | POA: Diagnosis not present

## 2020-11-11 DIAGNOSIS — Z13220 Encounter for screening for lipoid disorders: Secondary | ICD-10-CM | POA: Diagnosis not present

## 2020-11-18 DIAGNOSIS — I5189 Other ill-defined heart diseases: Secondary | ICD-10-CM | POA: Diagnosis not present

## 2021-01-01 DIAGNOSIS — Z7185 Encounter for immunization safety counseling: Secondary | ICD-10-CM | POA: Diagnosis not present

## 2021-01-01 DIAGNOSIS — R002 Palpitations: Secondary | ICD-10-CM | POA: Diagnosis not present

## 2021-01-01 DIAGNOSIS — Z23 Encounter for immunization: Secondary | ICD-10-CM | POA: Diagnosis not present

## 2021-01-05 DIAGNOSIS — M79602 Pain in left arm: Secondary | ICD-10-CM | POA: Diagnosis not present

## 2021-01-23 DIAGNOSIS — M25512 Pain in left shoulder: Secondary | ICD-10-CM | POA: Diagnosis not present

## 2021-02-05 DIAGNOSIS — M25512 Pain in left shoulder: Secondary | ICD-10-CM | POA: Diagnosis not present

## 2021-02-09 DIAGNOSIS — M25512 Pain in left shoulder: Secondary | ICD-10-CM | POA: Diagnosis not present

## 2021-02-11 DIAGNOSIS — M25512 Pain in left shoulder: Secondary | ICD-10-CM | POA: Diagnosis not present

## 2021-02-17 DIAGNOSIS — M25512 Pain in left shoulder: Secondary | ICD-10-CM | POA: Diagnosis not present

## 2021-02-19 DIAGNOSIS — M25512 Pain in left shoulder: Secondary | ICD-10-CM | POA: Diagnosis not present

## 2021-02-24 DIAGNOSIS — M25512 Pain in left shoulder: Secondary | ICD-10-CM | POA: Diagnosis not present

## 2021-02-27 DIAGNOSIS — M25512 Pain in left shoulder: Secondary | ICD-10-CM | POA: Diagnosis not present

## 2021-03-06 DIAGNOSIS — Z1211 Encounter for screening for malignant neoplasm of colon: Secondary | ICD-10-CM | POA: Diagnosis not present

## 2021-03-06 DIAGNOSIS — K644 Residual hemorrhoidal skin tags: Secondary | ICD-10-CM | POA: Diagnosis not present

## 2021-03-06 DIAGNOSIS — D123 Benign neoplasm of transverse colon: Secondary | ICD-10-CM | POA: Diagnosis not present

## 2021-03-18 DIAGNOSIS — M25512 Pain in left shoulder: Secondary | ICD-10-CM | POA: Diagnosis not present

## 2021-03-20 DIAGNOSIS — I5189 Other ill-defined heart diseases: Secondary | ICD-10-CM | POA: Diagnosis not present

## 2021-03-20 DIAGNOSIS — R7303 Prediabetes: Secondary | ICD-10-CM | POA: Diagnosis not present

## 2021-03-20 DIAGNOSIS — I10 Essential (primary) hypertension: Secondary | ICD-10-CM | POA: Diagnosis not present

## 2021-03-20 DIAGNOSIS — E559 Vitamin D deficiency, unspecified: Secondary | ICD-10-CM | POA: Diagnosis not present

## 2021-04-09 DIAGNOSIS — F40240 Claustrophobia: Secondary | ICD-10-CM | POA: Diagnosis not present

## 2021-04-09 DIAGNOSIS — M5412 Radiculopathy, cervical region: Secondary | ICD-10-CM | POA: Diagnosis not present

## 2021-04-10 ENCOUNTER — Other Ambulatory Visit: Payer: Self-pay | Admitting: Sports Medicine

## 2021-04-10 ENCOUNTER — Other Ambulatory Visit: Payer: Self-pay | Admitting: Obstetrics and Gynecology

## 2021-04-10 DIAGNOSIS — Z1231 Encounter for screening mammogram for malignant neoplasm of breast: Secondary | ICD-10-CM

## 2021-04-10 DIAGNOSIS — M5412 Radiculopathy, cervical region: Secondary | ICD-10-CM

## 2021-04-15 ENCOUNTER — Telehealth: Payer: Self-pay | Admitting: *Deleted

## 2021-04-15 NOTE — Telephone Encounter (Signed)
Patient scheduled to see Dr Berline Lopes on 2/17

## 2021-04-30 DIAGNOSIS — L821 Other seborrheic keratosis: Secondary | ICD-10-CM | POA: Diagnosis not present

## 2021-04-30 DIAGNOSIS — L0212 Furuncle of neck: Secondary | ICD-10-CM | POA: Diagnosis not present

## 2021-04-30 DIAGNOSIS — D485 Neoplasm of uncertain behavior of skin: Secondary | ICD-10-CM | POA: Diagnosis not present

## 2021-04-30 DIAGNOSIS — L814 Other melanin hyperpigmentation: Secondary | ICD-10-CM | POA: Diagnosis not present

## 2021-04-30 DIAGNOSIS — D2262 Melanocytic nevi of left upper limb, including shoulder: Secondary | ICD-10-CM | POA: Diagnosis not present

## 2021-04-30 DIAGNOSIS — D225 Melanocytic nevi of trunk: Secondary | ICD-10-CM | POA: Diagnosis not present

## 2021-05-08 ENCOUNTER — Ambulatory Visit: Payer: Federal, State, Local not specified - PPO

## 2021-05-08 ENCOUNTER — Other Ambulatory Visit: Payer: Self-pay

## 2021-05-08 ENCOUNTER — Ambulatory Visit
Admission: RE | Admit: 2021-05-08 | Discharge: 2021-05-08 | Disposition: A | Discharge status: Home / Self Care | Payer: Federal, State, Local not specified - PPO | Source: Ambulatory Visit | Attending: Sports Medicine | Admitting: Sports Medicine

## 2021-05-08 DIAGNOSIS — R200 Anesthesia of skin: Secondary | ICD-10-CM | POA: Diagnosis not present

## 2021-05-08 DIAGNOSIS — M542 Cervicalgia: Secondary | ICD-10-CM | POA: Diagnosis not present

## 2021-05-08 DIAGNOSIS — M5412 Radiculopathy, cervical region: Secondary | ICD-10-CM

## 2021-05-08 DIAGNOSIS — M4802 Spinal stenosis, cervical region: Secondary | ICD-10-CM | POA: Diagnosis not present

## 2021-05-08 DIAGNOSIS — M2578 Osteophyte, vertebrae: Secondary | ICD-10-CM | POA: Diagnosis not present

## 2021-05-18 ENCOUNTER — Ambulatory Visit: Payer: Federal, State, Local not specified - PPO

## 2021-05-19 ENCOUNTER — Encounter: Payer: Self-pay | Admitting: Gynecologic Oncology

## 2021-05-22 ENCOUNTER — Other Ambulatory Visit: Payer: Self-pay

## 2021-05-22 ENCOUNTER — Inpatient Hospital Stay: Payer: Federal, State, Local not specified - PPO | Attending: Gynecologic Oncology | Admitting: Gynecologic Oncology

## 2021-05-22 VITALS — BP 139/65 | HR 100 | Temp 98.9°F | Resp 14 | Ht 64.17 in | Wt 222.8 lb

## 2021-05-22 DIAGNOSIS — I10 Essential (primary) hypertension: Secondary | ICD-10-CM | POA: Diagnosis not present

## 2021-05-22 DIAGNOSIS — Z79899 Other long term (current) drug therapy: Secondary | ICD-10-CM | POA: Insufficient documentation

## 2021-05-22 DIAGNOSIS — Z90722 Acquired absence of ovaries, bilateral: Secondary | ICD-10-CM | POA: Insufficient documentation

## 2021-05-22 DIAGNOSIS — Z808 Family history of malignant neoplasm of other organs or systems: Secondary | ICD-10-CM | POA: Diagnosis not present

## 2021-05-22 DIAGNOSIS — Z148 Genetic carrier of other disease: Secondary | ICD-10-CM | POA: Diagnosis not present

## 2021-05-22 DIAGNOSIS — I739 Peripheral vascular disease, unspecified: Secondary | ICD-10-CM | POA: Diagnosis not present

## 2021-05-22 DIAGNOSIS — Z803 Family history of malignant neoplasm of breast: Secondary | ICD-10-CM | POA: Diagnosis not present

## 2021-05-22 DIAGNOSIS — Z90710 Acquired absence of both cervix and uterus: Secondary | ICD-10-CM | POA: Insufficient documentation

## 2021-05-22 DIAGNOSIS — Z801 Family history of malignant neoplasm of trachea, bronchus and lung: Secondary | ICD-10-CM | POA: Diagnosis not present

## 2021-05-22 DIAGNOSIS — M1990 Unspecified osteoarthritis, unspecified site: Secondary | ICD-10-CM | POA: Diagnosis not present

## 2021-05-22 DIAGNOSIS — Z8042 Family history of malignant neoplasm of prostate: Secondary | ICD-10-CM | POA: Insufficient documentation

## 2021-05-22 DIAGNOSIS — Z800 Family history of malignant neoplasm of digestive organs: Secondary | ICD-10-CM | POA: Insufficient documentation

## 2021-05-22 DIAGNOSIS — C541 Malignant neoplasm of endometrium: Secondary | ICD-10-CM

## 2021-05-22 DIAGNOSIS — Z8542 Personal history of malignant neoplasm of other parts of uterus: Secondary | ICD-10-CM | POA: Diagnosis not present

## 2021-05-22 DIAGNOSIS — Z85820 Personal history of malignant melanoma of skin: Secondary | ICD-10-CM | POA: Diagnosis not present

## 2021-05-22 DIAGNOSIS — F419 Anxiety disorder, unspecified: Secondary | ICD-10-CM | POA: Diagnosis not present

## 2021-05-22 NOTE — Progress Notes (Signed)
Gynecologic Oncology Return Clinic Visit  05/22/2021  Reason for Visit: Surveillance visit in the setting of endometrial cancer  Treatment History: The patient has a history of previously normal regular menstrual cycles up until December 2018 when she began experiencing intermenstrual bleeding.  She then discussed this with her OB/GYN during in May 2019 routine visit.  This prompted a transvaginal ultrasound scan which identified a 1 cm endometrial polyp.  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  On 09/12/17 she underwent robotic assisted total hysterectomy, BSO, SLN biopsy. Final pathology revealed a grade 2 endometrioid (no serous features identified on final pathology), non-invasive lesion measuring 0.8cm. There was no LVSI. SLN's, cervix and adnexa were negative. IHC normal for MMR and PCR showed MSI normal.  She was determined to have low risk disease for recurrence, and in accordance with NCCN guidelines, no adjuvant therapy was recommended. Given her young age at diagnosis and prior melanoma history, she qualified for genetics evaluation. 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 She was not recommended additional surveillance for this.   Interval History: The patient reports doing well.  She had her gallbladder removed about a year ago and feels that this has helped with her digestion.  Endorses good appetite without nausea or emesis.  Had a colonoscopy in December.  She denies any vaginal bleeding or discharge.  Reports regular bowel function.  Has occasional stress incontinence, otherwise denies urinary symptoms.  Has been struggling with about 4 months of left arm numbness related to orthopedic issues with her back.  Her aunt and uncle have both passed away from tobacco  associated lung cancer since her last visit with [place].  Past Medical/Surgical History: Past Medical History:  Diagnosis Date   Anxiety    Arthritis    Endometrial cancer ([place])    Family history of breast cancer    Family history of lung cancer    Family history of melanoma    Family history of prostate cancer    Family history of throat cancer    Hypertension    IBS (irritable bowel syndrome)    Melanoma of skin ([place]) 11/11/2017   Peripheral vascular disease ([place])    Skin cancer    Skin cancer (melanoma) ([place])    1999   Sleep apnea    mild- mouth guard     Past Surgical History:  Procedure Laterality Date   BREAST BIOPSY Left 03/04/2016   BREAST BIOPSY Left 03/06/2015   CHOLECYSTECTOMY N/A 05/2020   DENTAL SURGERY     [place] tooth extraction   LYMPH NODE BIOPSY N/A 09/12/2017   Procedure: SENTINEL LYMPH NODE BIOPSY;  Surgeon: Everitt Amber, MD;  Location: [facility];  Service: Gynecology;  Laterality: N/A;   ROBOTIC ASSISTED TOTAL HYSTERECTOMY WITH BILATERAL SALPINGO OOPHERECTOMY Bilateral 09/12/2017   Procedure: XI ROBOTIC ASSISTED TOTAL LAPAROSCOPIC  HYSTERECTOMY WITH BILATERAL SALPINGO OOPHORECTOMY;  Surgeon: Everitt Amber, MD;  Location: [facility];  Service: Gynecology;  Laterality: Bilateral;    Family History  Problem Relation Age of Onset   Breast cancer Paternal Grandmother 66       dx at very advanced stage.   Hypercholesterolemia Mother    Colon polyps Mother        a few every time, goes every 3 years   Endometriosis Mother    Diabetes Father  Multiple sclerosis Father    Prostate cancer Father 70       metastatic to bone   Colon polyps Father    Diabetes Maternal Aunt    Melanoma Maternal Aunt 55   Diabetes Maternal Uncle    Lung cancer Maternal Uncle    Throat cancer Maternal Uncle    Hypertension Maternal Grandmother    Diabetes Maternal Grandmother    Diabetes Maternal Grandfather    Melanoma Maternal Grandfather 95   Heart attack Maternal Grandfather     Breast cancer Other        dx >50   Breast cancer Other        dx >50    Social History   Socioeconomic History   Marital status: Married    Spouse name: Not on file   Number of children: Not on file   Years of education: Not on file   Highest education level: Not on file  Occupational History   Not on file  Tobacco Use   Smoking status: Never   Smokeless tobacco: Never  Vaping Use   Vaping Use: Never used  Substance and Sexual Activity   Alcohol use: Yes    Alcohol/week: 3.0 standard drinks    Types: 3 Glasses of wine per week    Comment: 2 or 3 glasses of wine daily   Drug use: Never   Sexual activity: Yes    Birth control/protection: Surgical  Other Topics Concern   Not on file  Social History Narrative   Not on file   Social Determinants of Health   Financial Resource Strain: Not on file  Food Insecurity: Not on file  Transportation Needs: Not on file  Physical Activity: Not on file  Stress: Not on file  Social Connections: Not on file    Current Medications:  Current Outpatient Medications:    acyclovir (ZOVIRAX) 400 MG tablet, Take 400 mg by mouth 2 (two) times daily as needed (outbreak)., Disp: , Rfl: 0   Calcium Carb-Cholecalciferol (CALCIUM + D3 PO), Take 1 tablet by mouth daily. gummies, Disp: , Rfl:    Cholecalciferol (VITAMIN D3) 30 MCG/15ML LIQD, Take by mouth. 2,000 IU daily, Disp: , Rfl:    cyclobenzaprine (FLEXERIL) 10 MG tablet, Take 10 mg by mouth 3 (three) times daily as needed for muscle spasms., Disp: , Rfl:    fluticasone (FLONASE) 50 MCG/ACT nasal spray, Place 1 spray into both nostrils daily as needed for allergies., Disp: , Rfl:    furosemide (LASIX) 20 MG tablet, 1 TABLET ORALLY ONCE A DAY IN THE MORNING AS NEEDED FOR SWELLING 30 DAY(S), Disp: , Rfl:    gabapentin (NEURONTIN) 100 MG capsule, Take 100 mg by mouth at bedtime., Disp: , Rfl:    ibuprofen (ADVIL,MOTRIN) 200 MG tablet, Take 400 mg by mouth 2 (two) times daily as needed for  moderate pain., Disp: , Rfl:    lisinopril-hydrochlorothiazide (PRINZIDE,ZESTORETIC) 20-25 MG tablet, Take 1 tablet by mouth daily., Disp: , Rfl: 1   Omega-3 Fatty Acids (FISH OIL) 500 MG CAPS, Take 500 mg by mouth daily., Disp: , Rfl:    doxycycline (VIBRAMYCIN) 100 MG capsule, Take 100 mg by mouth 2 (two) times daily. Dr Greer Pickerel (Patient not taking: Reported on 05/19/2021), Disp: , Rfl:    Menthol, Topical Analgesic, (BIOFREEZE EX), Apply 1 application topically 2 (two) times daily as needed (back pain). (Patient not taking: Reported on 05/19/2021), Disp: , Rfl:    oxyCODONE (OXY IR/ROXICODONE) 5 MG immediate release  tablet, Take 1 tablet (5 mg total) by mouth every 6 (six) hours as needed for severe pain. (Patient not taking: Reported on 05/19/2021), Disp: 12 tablet, Rfl: 0   vitamin C (ASCORBIC ACID) 250 MG tablet, Take 250 mg by mouth daily. (Patient not taking: Reported on 05/19/2021), Disp: , Rfl:  No current facility-administered medications for this visit.  Facility-Administered Medications Ordered in Other Visits:    indocyanine green (IC-GREEN) injection 2.5 mg, 2.5 mg, Intravenous, Once, Greer Pickerel, MD  Review of Systems: + Joint pain, numbness Denies appetite changes, fevers, chills, fatigue, unexplained weight changes. Denies hearing loss, neck lumps or masses, mouth sores, ringing in ears or voice changes. Denies cough or wheezing.  Denies shortness of breath. Denies chest pain or palpitations. Denies leg swelling. Denies abdominal distention, pain, blood in stools, constipation, diarrhea, nausea, vomiting, or early satiety. Denies pain with intercourse, dysuria, frequency, hematuria or incontinence. Denies hot flashes, pelvic pain, vaginal bleeding or vaginal discharge.   Denies back pain or muscle pain/cramps. Denies itching, rash, or wounds. Denies dizziness, headaches, or seizures. Denies swollen lymph nodes or glands, denies easy bruising or bleeding. Denies anxiety,  depression, confusion, or decreased concentration.  Physical Exam: BP 139/65 (BP Location: Left Arm, Patient Position: Sitting)    Pulse 100    Temp 98.9 F (37.2 C) (Oral)    Resp 14    Ht 5' 4.17" (1.63 m)    Wt 222 lb 12.8 oz (101.1 kg)    LMP 07/04/2017 (Exact Date)    SpO2 100%    BMI 38.04 kg/m  General: Alert, oriented, no acute distress. HEENT: Normocephalic, atraumatic, sclera anicteric. Chest: Clear to auscultation bilaterally.  No wheezes or rhonchi. Cardiovascular: Regular rate and rhythm, no murmurs. Abdomen: Obese, soft, nontender.  Normoactive bowel sounds.  No masses or hepatosplenomegaly appreciated.  Well-healed incisions. Extremities: Grossly normal range of motion.  Warm, well perfused.  No edema bilaterally. Skin: No rashes or lesions noted. Lymphatics: No cervical, supraclavicular, or inguinal adenopathy. GU: Normal appearing external genitalia without erythema, excoriation, or lesions.  Speculum exam reveals vaginal mucosa without lesions.  No bleeding or discharge.  Bimanual exam reveals cuff intact, no nodularity or masses.  Rectovaginal exam confirms these findings.  Laboratory & Radiologic Studies: None new  Assessment & Plan: Tina Ray is a 51 y.o. 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 Hereditary BARD1 mutation of uncertain significance.  Pathology revealed low risk factors for recurrence, therefore no adjuvant therapy is recommended according to NCCN guidelines.  Patient is doing well today and is NED on exam.  We reviewed signs and symptoms that would be concerning for disease recurrence and I have urged her to call if she develops any of these between visits.   We discussed continued follow-up every 6 months for 5 years in accordance with NCCN guidelines. Those visits should include symptom assessment, physical exam and pelvic examination. Pap smears are not indicated  or recommended in the routine surveillance of endometrial cancer.  She is alternating visits between our clinic and her OB/GYN. She will return to see Dr Melba Coon annually in July, will see me in February of next year.   She was interested in a bone scan today.  I have suggested that she reach out to her primary care provider to discuss this.  32 minutes of total time was spent for this patient encounter, including preparation, face-to-face counseling with the patient and  coordination of care, and documentation of the encounter.  Jeral Pinch, MD  Division of Gynecologic Oncology  Department of Obstetrics and Gynecology  [facility]

## 2021-05-22 NOTE — Patient Instructions (Signed)
It was good to meet you today.  I do not see or feel any evidence of cancer recurrence on your exam.  Please make sure you follow-up with your OB/GYN this summer.    I will see you this same time next year.  You can call anytime after November or December to get that visit scheduled with me.   As always, if you develop any concerning symptoms, please call to see me sooner.

## 2021-05-26 ENCOUNTER — Other Ambulatory Visit: Payer: Self-pay

## 2021-05-26 ENCOUNTER — Ambulatory Visit
Admission: RE | Admit: 2021-05-26 | Discharge: 2021-05-26 | Disposition: A | Discharge status: Home / Self Care | Payer: Federal, State, Local not specified - PPO | Source: Ambulatory Visit | Attending: Obstetrics and Gynecology | Admitting: Obstetrics and Gynecology

## 2021-05-26 DIAGNOSIS — Z1231 Encounter for screening mammogram for malignant neoplasm of breast: Secondary | ICD-10-CM | POA: Diagnosis not present

## 2021-06-09 DIAGNOSIS — M5412 Radiculopathy, cervical region: Secondary | ICD-10-CM | POA: Diagnosis not present

## 2021-07-02 DIAGNOSIS — M5412 Radiculopathy, cervical region: Secondary | ICD-10-CM | POA: Diagnosis not present

## 2021-07-27 DIAGNOSIS — R002 Palpitations: Secondary | ICD-10-CM | POA: Diagnosis not present

## 2021-07-27 DIAGNOSIS — I10 Essential (primary) hypertension: Secondary | ICD-10-CM | POA: Diagnosis not present

## 2021-08-19 DIAGNOSIS — M5412 Radiculopathy, cervical region: Secondary | ICD-10-CM | POA: Diagnosis not present

## 2021-08-27 DIAGNOSIS — R051 Acute cough: Secondary | ICD-10-CM | POA: Diagnosis not present

## 2021-11-05 DIAGNOSIS — M5030 Other cervical disc degeneration, unspecified cervical region: Secondary | ICD-10-CM | POA: Diagnosis not present

## 2021-11-23 DIAGNOSIS — Z124 Encounter for screening for malignant neoplasm of cervix: Secondary | ICD-10-CM | POA: Diagnosis not present

## 2021-11-23 DIAGNOSIS — Z130 Encounter for screening for diseases of the blood and blood-forming organs and certain disorders involving the immune mechanism: Secondary | ICD-10-CM | POA: Diagnosis not present

## 2021-11-23 DIAGNOSIS — Z1151 Encounter for screening for human papillomavirus (HPV): Secondary | ICD-10-CM | POA: Diagnosis not present

## 2021-11-23 DIAGNOSIS — E559 Vitamin D deficiency, unspecified: Secondary | ICD-10-CM | POA: Diagnosis not present

## 2021-11-23 DIAGNOSIS — R635 Abnormal weight gain: Secondary | ICD-10-CM | POA: Diagnosis not present

## 2021-11-23 DIAGNOSIS — Z13228 Encounter for screening for other metabolic disorders: Secondary | ICD-10-CM | POA: Diagnosis not present

## 2021-11-23 DIAGNOSIS — Z01419 Encounter for gynecological examination (general) (routine) without abnormal findings: Secondary | ICD-10-CM | POA: Diagnosis not present

## 2021-11-26 DIAGNOSIS — R002 Palpitations: Secondary | ICD-10-CM | POA: Diagnosis not present

## 2021-12-15 DIAGNOSIS — H40013 Open angle with borderline findings, low risk, bilateral: Secondary | ICD-10-CM | POA: Diagnosis not present

## 2021-12-23 ENCOUNTER — Encounter: Payer: Self-pay | Admitting: Cardiology

## 2021-12-23 NOTE — Progress Notes (Unsigned)
Cardiology Office Note   Date:  12/24/2021   ID:  Tina Ray, DOB 23-Jun-1970, MRN [id number]  PCP:  Orpah Melter, MD  Cardiologist:   Minus Breeding, MD Referring:   Orpah Melter, MD  Chief Complaint  Patient presents with   Palpitations      History of Present Illness: Tina Ray is a 51 y.o. female who is referred by Orpah Melter, MD for evaluation of palpitations.  She has no past cardiac history.  She has been getting palpitations for about a year.  These have been happening mostly at night.  She will feel her heart skipping.  It will be frequent throughout the night.  Sometimes it makes her anxious and keeps her awake.  She does not describe presyncope or syncope.  She does not really notice them during the day.  She cannot make them happen.  She denies any chest pressure, neck or arm discomfort.  She has had some anxiety related to these.  She is also having some hormonal problems.  She thought for a while might of been Effexor which was started but when this was discontinued she still have the palpitations.  She is otherwise physically active doing walking routinely and cannot bring on any symptoms.  She does not have any shortness of breath, PND or orthopnea.    Past Medical History:  Diagnosis Date   Anxiety    Arthritis    Endometrial cancer ([place])    Hypertension    IBS (irritable bowel syndrome)    Melanoma of skin ([place]) 11/11/2017   Peripheral vascular disease ([place])    Skin cancer (melanoma) ([place])    1999   Sleep apnea    mild- mouth guard     Past Surgical History:  Procedure Laterality Date   BREAST BIOPSY Left 03/04/2016   BREAST BIOPSY Left 03/06/2015   CHOLECYSTECTOMY N/A 05/2020   DENTAL SURGERY     wisedom tooth extraction   LYMPH NODE BIOPSY N/A 09/12/2017   Procedure: SENTINEL LYMPH NODE BIOPSY;  Surgeon: Everitt Amber, MD;  Location: [facility];  Service: Gynecology;  Laterality: N/A;   ROBOTIC ASSISTED TOTAL HYSTERECTOMY WITH  BILATERAL SALPINGO OOPHERECTOMY Bilateral 09/12/2017   Procedure: XI ROBOTIC ASSISTED TOTAL LAPAROSCOPIC  HYSTERECTOMY WITH BILATERAL SALPINGO OOPHORECTOMY;  Surgeon: Everitt Amber, MD;  Location: [facility];  Service: Gynecology;  Laterality: Bilateral;     Current Outpatient Medications  Medication Sig Dispense Refill   acyclovir (ZOVIRAX) 400 MG tablet Take 400 mg by mouth 2 (two) times daily as needed (outbreak).  0   Calcium Carb-Cholecalciferol (CALCIUM + D3 PO) Take 1 tablet by mouth daily. gummies     Cholecalciferol (VITAMIN D3) 30 MCG/15ML LIQD Take by mouth. 2,000 IU daily     cyclobenzaprine (FLEXERIL) 10 MG tablet Take 10 mg by mouth 3 (three) times daily as needed for muscle spasms.     gabapentin (NEURONTIN) 100 MG capsule Take 100 mg by mouth at bedtime.     ibuprofen (ADVIL,MOTRIN) 200 MG tablet Take 400 mg by mouth 2 (two) times daily as needed for moderate pain.     lisinopril-hydrochlorothiazide (PRINZIDE,ZESTORETIC) 20-25 MG tablet Take 1 tablet by mouth daily.  1   Menthol, Topical Analgesic, (BIOFREEZE EX) Apply 1 application  topically 2 (two) times daily as needed (back pain).     Omega-3 Fatty Acids (FISH OIL) 500 MG CAPS Take 500 mg by mouth daily.     propranolol (INDERAL) 10 MG tablet Take 1 tablet (  10 mg total) by mouth every 8 (eight) hours as needed (MAY TAKE EVERY 8 HOURS UP TO 3 TIMES DAILY AS NEEDED FOR PALPITATIONS). 30 tablet 3   No current facility-administered medications for this visit.   Facility-Administered Medications Ordered in Other Visits  Medication Dose Route Frequency Provider Last Rate Last Admin   indocyanine green (IC-GREEN) injection 2.5 mg  2.5 mg Intravenous Once Greer Pickerel, MD        Allergies:   Erythromycin base, Sulfa antibiotics, and Sulfamethoxazole-trimethoprim    Social History:  The patient  reports that she has never smoked. She has never used smokeless tobacco. She reports current alcohol use of about 3.0 standard drinks of  alcohol per week. She reports that she does not use drugs.   Family History:  The patient's family history includes Breast cancer in some other family members; Breast cancer (age of onset: 58) in her paternal grandmother; Colon polyps in her father and mother; Diabetes in her father, maternal aunt, maternal grandfather, maternal grandmother, and maternal uncle; Endometriosis in her mother; Heart attack in her maternal grandfather; Hypercholesterolemia in her mother; Hypertension in her maternal grandmother; Lung cancer in her maternal uncle; Melanoma (age of onset: 54) in her maternal aunt; Melanoma (age of onset: 28) in her maternal grandfather; Multiple sclerosis in her father; Prostate cancer (age of onset: 56) in her father; Throat cancer in her maternal uncle.    ROS:  Please see the history of present illness.   Otherwise, review of systems are positive for none.   All other systems are reviewed and negative.    PHYSICAL EXAM: VS:  BP 120/76   Pulse 76   Ht '5\' 3"'$  (1.6 m)   Wt 233 lb 9.6 oz (106 kg)   LMP 07/04/2017 (Exact Date)   SpO2 99%   BMI 41.38 kg/m  , BMI Body mass index is 41.38 kg/m. GENERAL:  Well appearing HEENT:  Pupils equal round and reactive, fundi not visualized, oral mucosa unremarkable NECK:  No jugular venous distention, waveform within normal limits, carotid upstroke brisk and symmetric, no bruits, no thyromegaly LYMPHATICS:  No cervical, inguinal adenopathy LUNGS:  Clear to auscultation bilaterally BACK:  No CVA tenderness CHEST:  Unremarkable HEART:  PMI not displaced or sustained,S1 and S2 within normal limits, no S3, no S4, no clicks, no rubs, no murmurs ABD:  Flat, positive bowel sounds normal in frequency in pitch, no bruits, no rebound, no guarding, no midline pulsatile mass, no hepatomegaly, no splenomegaly EXT:  2 plus pulses throughout, mild right greater than left leg edema, no cyanosis no clubbing SKIN:  No rashes no nodules NEURO:  Cranial nerves II  through XII grossly intact, motor grossly intact throughout PSYCH:  Cognitively intact, oriented to person place and time    EKG:  EKG is ordered today. The ekg ordered today demonstrates sinus rhythm, rate 76, axis within normal limits, intervals within normal limits, low voltage in the limb leads, nonspecific ST-T wave changes.   Recent Labs: No results found for requested labs within last 365 days.    Lipid Panel No results found for: "CHOL", "TRIG", "HDL", "CHOLHDL", "VLDL", "LDLCALC", "LDLDIRECT"    Wt Readings from Last 3 Encounters:  12/24/21 233 lb 9.6 oz (106 kg)  05/22/21 222 lb 12.8 oz (101.1 kg)  05/27/20 217 lb 9.6 oz (98.7 kg)      Other studies Reviewed: Additional studies/ records that were reviewed today include: Labs and primary care records. Review of the above records  demonstrates:  Please see elsewhere in the note.     ASSESSMENT AND PLAN:  Palpitations:  Electrolytes and TSH were normal.  I suspect she is having PACs or PVCs.  I am going to apply a 3-day Zio patch.  She did better when she was given some metoprolol XL.  I think this is reasonable but also would consider switching to propranolol 10 mg every 8 hours as needed.  She will let me know which prescription works better for her.  Further management will be based on the results of the monitor.   Current medicines are reviewed at length with the patient today.  The patient does not have concerns regarding medicines.  The following changes have been made:  no change  Labs/ tests ordered today include: None  Orders Placed This Encounter  Procedures   LONG TERM MONITOR (3-14 DAYS)     Disposition:   FU with me in 3 months   Signed, Minus Breeding, MD  12/24/2021 8:31 AM    [place]

## 2021-12-24 ENCOUNTER — Encounter: Payer: Self-pay | Admitting: Cardiology

## 2021-12-24 ENCOUNTER — Ambulatory Visit: Payer: Federal, State, Local not specified - PPO | Attending: Cardiology | Admitting: Cardiology

## 2021-12-24 ENCOUNTER — Ambulatory Visit: Payer: Federal, State, Local not specified - PPO | Attending: Cardiology

## 2021-12-24 VITALS — BP 120/76 | HR 76 | Ht 63.0 in | Wt 233.6 lb

## 2021-12-24 DIAGNOSIS — R002 Palpitations: Secondary | ICD-10-CM

## 2021-12-24 MED ORDER — PROPRANOLOL HCL 10 MG PO TABS: 10.0000 mg | ORAL_TABLET | Freq: Three times a day (TID) | ORAL | 3 refills | Status: DC | PRN

## 2021-12-24 NOTE — Patient Instructions (Signed)
Medication Instructions:  START: PROPANOLOL '10mg'$  EVERY 8 HOURS UP TO THREE TIMES DAILY ONLY AS NEEDED FOR PALPIRATIONS  *If you need a refill on your cardiac medications before your next appointment, please call your pharmacy*  Lab Work: None Ordered At This Time.  If you have labs (blood work) drawn today and your tests are completely normal, you will receive your results only by: [place] (if you have MyChart) OR A paper copy in the mail If you have any lab test that is abnormal or we need to change your treatment, we will call you to review the results.  Testing/Procedures:  [facility] [place]- Long Term Monitor Instructions   Your physician has requested you wear your ZIO patch monitor___3____days.   This is a single patch monitor.  [employer] supplies one patch monitor per enrollment.  Additional stickers are not available.   Please do not apply patch if you will be having a Nuclear Stress Test, Echocardiogram, Cardiac CT, MRI, or Chest Xray during the time frame you would be wearing the monitor. The patch cannot be worn during these tests.  You cannot remove and re-apply the ZIO XT patch monitor.   Your ZIO patch monitor will be sent USPS Priority mail from [facility] directly to your home address. The monitor may also be mailed to a PO BOX if home delivery is not available.   It may take 3-5 days to receive your monitor after you have been enrolled.   Once you have received you monitor, please review enclosed instructions.  Your monitor has already been registered assigning a specific monitor serial # to you.   Applying the monitor   Shave hair from upper left chest.   Hold abrader disc by orange tab.  Rub abrader in 40 strokes over left upper chest as indicated in your monitor instructions.   Clean area with 4 enclosed alcohol pads .  Use all pads to assure are is cleaned thoroughly.  Let dry.   Apply patch as indicated in monitor instructions.  Patch will be place under  collarbone on left side of chest with arrow pointing upward.   Rub patch adhesive wings for 2 minutes.Remove white label marked "1".  Remove white label marked "2".  Rub patch adhesive wings for 2 additional minutes.   While looking in a mirror, press and release button in center of patch.  A small green light will flash 3-4 times .  This will be your only indicator the monitor has been turned on.     Do not shower for the first 24 hours.  You may shower after the first 24 hours.   Press button if you feel a symptom. You will hear a small click.  Record Date, Time and Symptom in the Patient Log Book.   When you are ready to remove patch, follow instructions on last 2 pages of Patient Log Book.  Stick patch monitor onto last page of Patient Log Book.   Place Patient Log Book in [place] box.  Use locking tab on box and tape box closed securely.  The [employer] has [employer] on it.  Please place in mailbox as soon as possible.  Your physician should have your test results approximately 7 days after the monitor has been mailed back to [facility].   Call [place] at [phone] if you have questions regarding your ZIO XT patch monitor.  Call them immediately if you see an orange light blinking on your monitor.  If your monitor falls off in less than 4 days contact our Monitor department at [phone].  If your monitor becomes loose or falls off after 4 days call [employer] at [phone] for suggestions on securing your monitor.   Follow-Up: At [facility], you and your health needs are our priority.  As part of our continuing mission to provide you with exceptional heart care, we have created designated Provider Care Teams.  These Care Teams include your primary Cardiologist (physician) and Advanced Practice Providers (APPs -  Physician Assistants and Nurse Practitioners) who all work together to provide you with the care you need, when you need  it.  We recommend signing up for the patient portal called "MyChart".  Sign up information is provided on this After Visit Summary.  MyChart is used to connect with patients for Virtual Visits (Telemedicine).  Patients are able to view lab/test results, encounter notes, upcoming appointments, etc.  Non-urgent messages can be sent to your provider as well.   To learn more about what you can do with MyChart, go to [email or web address].    Your next appointment:   3 month(s)  The format for your next appointment:   In Person  Provider:   Minus Breeding, MD

## 2021-12-24 NOTE — Progress Notes (Unsigned)
Enrolled for [employer] to mail a ZIO XT long term holter monitor to the patients address on file.  

## 2021-12-25 NOTE — Addendum Note (Signed)
Addended by: Hinton Dyer on: 12/25/2021 03:12 PM   Modules accepted: Orders

## 2021-12-31 DIAGNOSIS — E538 Deficiency of other specified B group vitamins: Secondary | ICD-10-CM | POA: Diagnosis not present

## 2021-12-31 DIAGNOSIS — E559 Vitamin D deficiency, unspecified: Secondary | ICD-10-CM | POA: Diagnosis not present

## 2021-12-31 DIAGNOSIS — R002 Palpitations: Secondary | ICD-10-CM | POA: Diagnosis not present

## 2021-12-31 DIAGNOSIS — R7303 Prediabetes: Secondary | ICD-10-CM | POA: Diagnosis not present

## 2021-12-31 DIAGNOSIS — Z9189 Other specified personal risk factors, not elsewhere classified: Secondary | ICD-10-CM | POA: Diagnosis not present

## 2021-12-31 DIAGNOSIS — Z8639 Personal history of other endocrine, nutritional and metabolic disease: Secondary | ICD-10-CM | POA: Diagnosis not present

## 2021-12-31 DIAGNOSIS — Z1331 Encounter for screening for depression: Secondary | ICD-10-CM | POA: Diagnosis not present

## 2021-12-31 DIAGNOSIS — E8889 Other specified metabolic disorders: Secondary | ICD-10-CM | POA: Diagnosis not present

## 2021-12-31 DIAGNOSIS — I10 Essential (primary) hypertension: Secondary | ICD-10-CM | POA: Diagnosis not present

## 2022-01-07 DIAGNOSIS — R002 Palpitations: Secondary | ICD-10-CM | POA: Diagnosis not present

## 2022-01-12 DIAGNOSIS — Z780 Asymptomatic menopausal state: Secondary | ICD-10-CM | POA: Diagnosis not present

## 2022-01-12 DIAGNOSIS — E538 Deficiency of other specified B group vitamins: Secondary | ICD-10-CM | POA: Diagnosis not present

## 2022-01-12 DIAGNOSIS — Z9189 Other specified personal risk factors, not elsewhere classified: Secondary | ICD-10-CM | POA: Diagnosis not present

## 2022-01-12 DIAGNOSIS — R7303 Prediabetes: Secondary | ICD-10-CM | POA: Diagnosis not present

## 2022-01-12 DIAGNOSIS — I10 Essential (primary) hypertension: Secondary | ICD-10-CM | POA: Diagnosis not present

## 2022-01-25 DIAGNOSIS — Z23 Encounter for immunization: Secondary | ICD-10-CM | POA: Diagnosis not present

## 2022-02-02 DIAGNOSIS — I10 Essential (primary) hypertension: Secondary | ICD-10-CM | POA: Diagnosis not present

## 2022-02-02 DIAGNOSIS — E7800 Pure hypercholesterolemia, unspecified: Secondary | ICD-10-CM | POA: Diagnosis not present

## 2022-02-02 DIAGNOSIS — R7303 Prediabetes: Secondary | ICD-10-CM | POA: Diagnosis not present

## 2022-02-02 DIAGNOSIS — R002 Palpitations: Secondary | ICD-10-CM | POA: Diagnosis not present

## 2022-02-10 DIAGNOSIS — D485 Neoplasm of uncertain behavior of skin: Secondary | ICD-10-CM | POA: Diagnosis not present

## 2022-02-10 DIAGNOSIS — D225 Melanocytic nevi of trunk: Secondary | ICD-10-CM | POA: Diagnosis not present

## 2022-02-10 DIAGNOSIS — L820 Inflamed seborrheic keratosis: Secondary | ICD-10-CM | POA: Diagnosis not present

## 2022-02-10 DIAGNOSIS — D2272 Melanocytic nevi of left lower limb, including hip: Secondary | ICD-10-CM | POA: Diagnosis not present

## 2022-02-10 DIAGNOSIS — D2271 Melanocytic nevi of right lower limb, including hip: Secondary | ICD-10-CM | POA: Diagnosis not present

## 2022-02-10 DIAGNOSIS — D224 Melanocytic nevi of scalp and neck: Secondary | ICD-10-CM | POA: Diagnosis not present

## 2022-02-11 DIAGNOSIS — I10 Essential (primary) hypertension: Secondary | ICD-10-CM | POA: Diagnosis not present

## 2022-02-11 DIAGNOSIS — E559 Vitamin D deficiency, unspecified: Secondary | ICD-10-CM | POA: Diagnosis not present

## 2022-02-11 DIAGNOSIS — E538 Deficiency of other specified B group vitamins: Secondary | ICD-10-CM | POA: Diagnosis not present

## 2022-02-11 DIAGNOSIS — R7303 Prediabetes: Secondary | ICD-10-CM | POA: Diagnosis not present

## 2022-03-22 ENCOUNTER — Ambulatory Visit: Payer: Federal, State, Local not specified - PPO | Admitting: Cardiology

## 2022-05-07 ENCOUNTER — Other Ambulatory Visit: Payer: Self-pay | Admitting: Obstetrics and Gynecology

## 2022-05-07 DIAGNOSIS — Z1231 Encounter for screening mammogram for malignant neoplasm of breast: Secondary | ICD-10-CM

## 2022-05-15 NOTE — Progress Notes (Unsigned)
Cardiology Office Note   Date:  05/17/2022   ID:  Tina, Ray 08/14/1970, MRN [id number]  PCP:  Orpah Melter, MD  Cardiologist:   Minus Breeding, MD Referring:   Orpah Melter, MD  Chief Complaint  Patient presents with   Palpitations      History of Present Illness: Tina Ray is a 52 y.o. female who is referred by Orpah Melter, MD for evaluation of palpitations.  She wore a monitor that demonstrated very rare atrial ventricular ectopy.  She has been taking her metoprolol in the evening and then taking her propranolol at night particularly if she feels anxious and she says this helps her.  She is having fewer palpitations.  She has had no presyncope or syncope.  Her blood pressure is elevated today but she forgot to take her lisinopril.  She is starting to do a little physical activity.   Past Medical History:  Diagnosis Date   Anxiety    Arthritis    Endometrial cancer ([place])    Hypertension    IBS (irritable bowel syndrome)    Melanoma of skin ([place]) 11/11/2017   Peripheral vascular disease ([place])    Skin cancer (melanoma) ([place])    1999   Sleep apnea    mild- mouth guard     Past Surgical History:  Procedure Laterality Date   BREAST BIOPSY Left 03/04/2016   BREAST BIOPSY Left 03/06/2015   CHOLECYSTECTOMY N/A 05/2020   DENTAL SURGERY     wisedom tooth extraction   LYMPH NODE BIOPSY N/A 09/12/2017   Procedure: SENTINEL LYMPH NODE BIOPSY;  Surgeon: Everitt Amber, MD;  Location: [facility];  Service: Gynecology;  Laterality: N/A;   ROBOTIC ASSISTED TOTAL HYSTERECTOMY WITH BILATERAL SALPINGO OOPHERECTOMY Bilateral 09/12/2017   Procedure: XI ROBOTIC ASSISTED TOTAL LAPAROSCOPIC  HYSTERECTOMY WITH BILATERAL SALPINGO OOPHORECTOMY;  Surgeon: Everitt Amber, MD;  Location: [facility];  Service: Gynecology;  Laterality: Bilateral;     Current Outpatient Medications  Medication Sig Dispense Refill   acyclovir (ZOVIRAX) 400 MG tablet Take 400 mg by mouth 2 (two)  times daily as needed (outbreak).  0   Cholecalciferol (VITAMIN D3) 30 MCG/15ML LIQD Take by mouth. 2,000 IU daily     cyclobenzaprine (FLEXERIL) 10 MG tablet Take 10 mg by mouth 3 (three) times daily as needed for muscle spasms.     gabapentin (NEURONTIN) 100 MG capsule Take 100 mg by mouth at bedtime.     lisinopril-hydrochlorothiazide (PRINZIDE,ZESTORETIC) 20-25 MG tablet Take 1 tablet by mouth daily.  1   Menaquinone-7 (VITAMIN K2 PO) Take 45 mg by mouth daily.     metoprolol succinate (TOPROL-XL) 25 MG 24 hr tablet Take 25 mg by mouth daily.     Omega-3 Fatty Acids (FISH OIL) 500 MG CAPS Take 500 mg by mouth daily.     propranolol (INDERAL) 10 MG tablet Take 1 tablet (10 mg total) by mouth every 8 (eight) hours as needed (MAY TAKE EVERY 8 HOURS UP TO 3 TIMES DAILY AS NEEDED FOR PALPITATIONS). 30 tablet 3   No current facility-administered medications for this visit.   Facility-Administered Medications Ordered in Other Visits  Medication Dose Route Frequency Provider Last Rate Last Admin   indocyanine green (IC-GREEN) injection 2.5 mg  2.5 mg Intravenous Once Greer Pickerel, MD        Allergies:   Erythromycin base, Sulfa antibiotics, and Sulfamethoxazole-trimethoprim    ROS:  Please see the history of present illness.   Otherwise, review of  systems are positive for none.   All other systems are reviewed and negative.    PHYSICAL EXAM: VS:  BP (!) 132/90   Pulse 100   Ht 5' 2.5" (1.588 m)   Wt 233 lb (105.7 kg)   LMP 07/04/2017 (Exact Date)   SpO2 98%   BMI 41.94 kg/m  , BMI Body mass index is 41.94 kg/m. GENERAL:  Well appearing NECK:  No jugular venous distention, waveform within normal limits, carotid upstroke brisk and symmetric, no bruits, no thyromegaly LUNGS:  Clear to auscultation bilaterally CHEST:  Unremarkable HEART:  PMI not displaced or sustained,S1 and S2 within normal limits, no S3, no S4, no clicks, no rubs, no murmurs ABD:  Flat, positive bowel sounds normal  in frequency in pitch, no bruits, no rebound, no guarding, no midline pulsatile mass, no hepatomegaly, no splenomegaly EXT:  2 plus pulses throughout, no edema, no cyanosis no clubbing  EKG:  EKG is  ordered today. The ekg ordered today demonstrates sinus rhythm, rate 98, axis within normal limits, intervals within normal limits, low voltage in the limb leads, nonspecific ST-T wave changes.   Recent Labs: No results found for requested labs within last 365 days.    Lipid Panel No results found for: "CHOL", "TRIG", "HDL", "CHOLHDL", "VLDL", "LDLCALC", "LDLDIRECT"    Wt Readings from Last 3 Encounters:  05/17/22 233 lb (105.7 kg)  12/24/21 233 lb 9.6 oz (106 kg)  05/22/21 222 lb 12.8 oz (101.1 kg)      Other studies Reviewed: Additional studies/ records that were reviewed today include: None Review of the above records demonstrates:  Please see elsewhere in the note.     ASSESSMENT AND PLAN:  Palpitations:   She wore a monitor and she had no arrhythmias.  She is going continue the meds as listed.  No change in therapy.  No further testing.  HTN: Blood pressure is elevated but this is unusual.  She says it is well-controlled on the meds as listed and she will continue with this.  Overweight: We had another discussion about exercise.  She has not some walking shoes.   Current medicines are reviewed at length with the patient today.  The patient does not have concerns regarding medicines.  The following changes have been made:  None  Labs/ tests ordered today include:    Orders Placed This Encounter  Procedures   EKG 12-Lead     Disposition:   FU with me in 6 months at her request.    Signed, Minus Breeding, MD  05/17/2022 10:13 AM    [place]

## 2022-05-17 ENCOUNTER — Encounter: Payer: Self-pay | Admitting: Cardiology

## 2022-05-17 ENCOUNTER — Ambulatory Visit: Payer: Federal, State, Local not specified - PPO | Attending: Cardiology | Admitting: Cardiology

## 2022-05-17 VITALS — BP 132/90 | HR 100 | Ht 62.5 in | Wt 233.0 lb

## 2022-05-17 DIAGNOSIS — R000 Tachycardia, unspecified: Secondary | ICD-10-CM

## 2022-05-17 DIAGNOSIS — R002 Palpitations: Secondary | ICD-10-CM

## 2022-05-17 NOTE — Patient Instructions (Signed)
Medication Instructions:   Your physician recommends that you continue on your current medications as directed. Please refer to the Current Medication list given to you today.  *If you need a refill on your cardiac medications before your next appointment, please call your pharmacy*  Lab Work: NONE ordered at this time of appointment   If you have labs (blood work) drawn today and your tests are completely normal, you will receive your results only by: [place] (if you have MyChart) OR A paper copy in the mail If you have any lab test that is abnormal or we need to change your treatment, we will call you to review the results.  Testing/Procedures: NONE ordered at this time of appointment   Follow-Up: At [facility], you and your health needs are our priority.  As part of our continuing mission to provide you with exceptional heart care, we have created designated Provider Care Teams.  These Care Teams include your primary Cardiologist (physician) and Advanced Practice Providers (APPs -  Physician Assistants and Nurse Practitioners) who all work together to provide you with the care you need, when you need it.  We recommend signing up for the patient portal called "MyChart".  Sign up information is provided on this After Visit Summary.  MyChart is used to connect with patients for Virtual Visits (Telemedicine).  Patients are able to view lab/test results, encounter notes, upcoming appointments, etc.  Non-urgent messages can be sent to your provider as well.   To learn more about what you can do with MyChart, go to [email or web address].    Your next appointment:   6 month(s)  Provider:   Minus Breeding, MD     Other Instructions

## 2022-06-10 DIAGNOSIS — M5412 Radiculopathy, cervical region: Secondary | ICD-10-CM | POA: Diagnosis not present

## 2022-06-10 DIAGNOSIS — Z6841 Body Mass Index (BMI) 40.0 and over, adult: Secondary | ICD-10-CM | POA: Diagnosis not present

## 2022-06-24 ENCOUNTER — Ambulatory Visit
Admission: RE | Admit: 2022-06-24 | Discharge: 2022-06-24 | Disposition: A | Discharge status: Home / Self Care | Payer: Federal, State, Local not specified - PPO | Source: Ambulatory Visit | Attending: Obstetrics and Gynecology | Admitting: Obstetrics and Gynecology

## 2022-06-24 DIAGNOSIS — Z1231 Encounter for screening mammogram for malignant neoplasm of breast: Secondary | ICD-10-CM

## 2022-06-24 DIAGNOSIS — M5412 Radiculopathy, cervical region: Secondary | ICD-10-CM | POA: Diagnosis not present

## 2022-07-13 ENCOUNTER — Encounter: Payer: Self-pay | Admitting: Gynecologic Oncology

## 2022-07-16 ENCOUNTER — Other Ambulatory Visit: Payer: Self-pay

## 2022-07-16 ENCOUNTER — Inpatient Hospital Stay: Payer: Federal, State, Local not specified - PPO | Attending: Gynecologic Oncology | Admitting: Gynecologic Oncology

## 2022-07-16 ENCOUNTER — Encounter: Payer: Self-pay | Admitting: Gynecologic Oncology

## 2022-07-16 VITALS — BP 150/81 | HR 95 | Temp 98.8°F | Wt 227.0 lb

## 2022-07-16 DIAGNOSIS — Z148 Genetic carrier of other disease: Secondary | ICD-10-CM | POA: Insufficient documentation

## 2022-07-16 DIAGNOSIS — Z1509 Genetic susceptibility to other malignant neoplasm: Secondary | ICD-10-CM | POA: Diagnosis not present

## 2022-07-16 DIAGNOSIS — Z1501 Genetic susceptibility to malignant neoplasm of breast: Secondary | ICD-10-CM | POA: Diagnosis not present

## 2022-07-16 DIAGNOSIS — C541 Malignant neoplasm of endometrium: Secondary | ICD-10-CM

## 2022-07-16 DIAGNOSIS — Z90710 Acquired absence of both cervix and uterus: Secondary | ICD-10-CM | POA: Diagnosis not present

## 2022-07-16 DIAGNOSIS — Z85820 Personal history of malignant melanoma of skin: Secondary | ICD-10-CM | POA: Insufficient documentation

## 2022-07-16 DIAGNOSIS — N951 Menopausal and female climacteric states: Secondary | ICD-10-CM | POA: Insufficient documentation

## 2022-07-16 DIAGNOSIS — Z8542 Personal history of malignant neoplasm of other parts of uterus: Secondary | ICD-10-CM | POA: Insufficient documentation

## 2022-07-16 DIAGNOSIS — Z90722 Acquired absence of ovaries, bilateral: Secondary | ICD-10-CM | POA: Insufficient documentation

## 2022-07-16 MED ORDER — ESTRADIOL 0.025 MG/24HR TD PTTW: 1.0000 | MEDICATED_PATCH | TRANSDERMAL | 12 refills | Status: AC

## 2022-07-16 NOTE — Patient Instructions (Signed)
It was good to see you today.  I do not see or feel any evidence of cancer recurrence on your exam.  You will be 5 years out from your diagnosis in June. Congratulations! You will need continued yearly follow-up with your OBGYN.  I've sent in a prescription for the lowest dose estrogen patch. Please let me know if this dose doesn't help control your symptoms.   As always, if you develop any new and concerning symptoms before your next visit, please call to see me sooner.

## 2022-07-16 NOTE — Progress Notes (Signed)
Gynecologic Oncology Return Clinic Visit  07/16/22  Reason for Visit: Surveillance visit in the setting of endometrial cancer   Treatment History: The patient has a history of previously normal regular menstrual cycles up until December 2018 when she began experiencing intermenstrual bleeding.  She then discussed this with her OB/GYN during in May 2019 routine visit.  This prompted a transvaginal ultrasound scan which identified a 1 cm endometrial polyp.  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  On 09/12/17 she underwent robotic assisted total hysterectomy, BSO, SLN biopsy. Final pathology revealed a grade 2 endometrioid (no serous features identified on final pathology), non-invasive lesion measuring 0.8cm. There was no LVSI. SLN's, cervix and adnexa were negative. IHC normal for MMR and PCR showed MSI normal.  She was determined to have low risk disease for recurrence, and in accordance with NCCN guidelines, no adjuvant therapy was recommended. Given her young age at diagnosis and prior melanoma history, she qualified for genetics evaluation. 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 She was not recommended additional surveillance for this.   Interval History: Has seen a cardiologist multiple times since she saw me last.  Was related to evaluation for palpitations.  She has now been started on metoprolol which has improved her symptoms significantly.  She denies any vaginal bleeding or discharge.  Denies any abdominal or pelvic pain.  Reports normal bowel bladder function.  She is struggling with menopausal symptoms.  Not having true hot flashes but having significant sweating at night.  Also has been having trouble sleeping in addition to her heart symptoms.  Past Medical/Surgical  History: Past Medical History:  Diagnosis Date   Anxiety    Arthritis    Endometrial cancer    Hypertension    IBS (irritable bowel syndrome)    Melanoma of skin 11/11/2017   Peripheral vascular disease    Skin cancer (melanoma)    1999   Sleep apnea    mild- mouth guard    Spinal stenosis     Past Surgical History:  Procedure Laterality Date   BREAST BIOPSY Left 03/04/2016   BREAST BIOPSY Left 03/06/2015   CHOLECYSTECTOMY N/A 05/2020   DENTAL SURGERY     wisedom tooth extraction   LYMPH NODE BIOPSY N/A 09/12/2017   Procedure: SENTINEL LYMPH NODE BIOPSY;  Surgeon: Adolphus Birchwood, MD;  Location: [facility];  Service: Gynecology;  Laterality: N/A;   ROBOTIC ASSISTED TOTAL HYSTERECTOMY WITH BILATERAL SALPINGO OOPHERECTOMY Bilateral 09/12/2017   Procedure: XI ROBOTIC ASSISTED TOTAL LAPAROSCOPIC  HYSTERECTOMY WITH BILATERAL SALPINGO OOPHORECTOMY;  Surgeon: Adolphus Birchwood, MD;  Location: [facility];  Service: Gynecology;  Laterality: Bilateral;    Family History  Problem Relation Age of Onset   Hypercholesterolemia Mother    Colon polyps Mother        a few every time, goes every 3 years   Endometriosis Mother    Diabetes Father    Multiple sclerosis Father    Prostate cancer Father 4       metastatic to bone   Colon polyps Father    Hypertension Maternal Grandmother    Diabetes Maternal Grandmother    Diabetes Maternal Grandfather    Melanoma Maternal Grandfather 63   Heart attack Maternal Grandfather    Breast cancer Paternal Grandmother 69       dx at very advanced  stage.   Diabetes Maternal Aunt    Melanoma Maternal Aunt 55   Diabetes Maternal Uncle    Lung cancer Maternal Uncle    Throat cancer Maternal Uncle    Breast cancer Other        dx >50   Breast cancer Other        dx >50    Social History   Socioeconomic History   Marital status: Married    Spouse name: Not on file   Number of children: Not on file   Years of education: Not on file   Highest education  level: Not on file  Occupational History   Not on file  Tobacco Use   Smoking status: Never   Smokeless tobacco: Never  Vaping Use   Vaping Use: Never used  Substance and Sexual Activity   Alcohol use: Yes    Alcohol/week: 3.0 standard drinks of alcohol    Types: 3 Glasses of wine per week    Comment: 2 or 3 glasses of wine daily   Drug use: Never   Sexual activity: Yes    Birth control/protection: Surgical  Other Topics Concern   Not on file  Social History Narrative   Lives husband.  Two grown children.     Social Determinants of Health   Financial Resource Strain: Not on file  Food Insecurity: Not on file  Transportation Needs: Not on file  Physical Activity: Not on file  Stress: Not on file  Social Connections: Not on file    Current Medications:  Current Outpatient Medications:    acyclovir (ZOVIRAX) 400 MG tablet, Take 400 mg by mouth 2 (two) times daily as needed (outbreak)., Disp: , Rfl: 0   Cholecalciferol (VITAMIN D3) 30 MCG/15ML LIQD, Take by mouth. 2,000 IU daily, Disp: , Rfl:    Cyanocobalamin (B-12) 2500 MCG SUBL, Place 1 tablet under the tongue daily., Disp: , Rfl:    cyclobenzaprine (FLEXERIL) 10 MG tablet, Take 10 mg by mouth 3 (three) times daily as needed for muscle spasms., Disp: , Rfl:    [START ON 07/19/2022] estradiol (VIVELLE-DOT) 0.025 MG/24HR, Place 1 patch onto the skin 2 (two) times a week., Disp: 8 patch, Rfl: 12   gabapentin (NEURONTIN) 100 MG capsule, Take 100 mg by mouth at bedtime., Disp: , Rfl:    lisinopril-hydrochlorothiazide (PRINZIDE,ZESTORETIC) 20-25 MG tablet, Take 1 tablet by mouth daily., Disp: , Rfl: 1   Menaquinone-7 (VITAMIN K2 PO), Take 45 mg by mouth daily., Disp: , Rfl:    metoprolol succinate (TOPROL-XL) 25 MG 24 hr tablet, Take 25 mg by mouth daily., Disp: , Rfl:    Omega-3 Fatty Acids (FISH OIL) 500 MG CAPS, Take 500 mg by mouth daily., Disp: , Rfl:    propranolol (INDERAL) 10 MG tablet, Take 1 tablet (10 mg total) by mouth  every 8 (eight) hours as needed (MAY TAKE EVERY 8 HOURS UP TO 3 TIMES DAILY AS NEEDED FOR PALPITATIONS)., Disp: 30 tablet, Rfl: 3 No current facility-administered medications for this visit.  Facility-Administered Medications Ordered in Other Visits:    indocyanine green (IC-GREEN) injection 2.5 mg, 2.5 mg, Intravenous, Once, Gaynelle Adu, MD  Review of Systems: + Ringing in ears, swelling of legs, palpitations, joint pain, numbness, easy bruising/bleeding, anxiety. Denies appetite changes, fevers, chills, fatigue, unexplained weight changes. Denies hearing loss, neck lumps or masses, mouth sores, or voice changes. Denies cough or wheezing.  Denies shortness of breath. Denies chest pain.  Denies abdominal distention, pain, blood in stools,  constipation, diarrhea, nausea, vomiting, or early satiety. Denies pain with intercourse, dysuria, frequency, hematuria or incontinence. Denies hot flashes, pelvic pain, vaginal bleeding or vaginal discharge.   Denies back pain or muscle pain/cramps. Denies itching, rash, or wounds. Denies dizziness, headaches, or seizures. Denies swollen lymph nodes or glands. Denies depression, confusion, or decreased concentration.  Physical Exam: BP (!) 150/81 (BP Location: Right Arm, Patient Position: Sitting)   Pulse 95   Temp 98.8 F (37.1 C) (Oral)   Wt 227 lb (103 kg)   LMP 07/04/2017 (Exact Date)   SpO2 99%   BMI 40.86 kg/m  General: Alert, oriented, no acute distress. HEENT: Normocephalic, atraumatic, sclera anicteric. Chest: Clear to auscultation bilaterally.  No wheezes or rhonchi. Cardiovascular: Regular rate and rhythm, no murmurs. Abdomen: Obese, soft, nontender.  Normoactive bowel sounds.  No masses or hepatosplenomegaly appreciated.  Well-healed incisions. Extremities: Grossly normal range of motion.  Warm, well perfused.  No edema bilaterally. Skin: No rashes or lesions noted. Lymphatics: No cervical, supraclavicular, or inguinal  adenopathy. GU: Normal appearing external genitalia without erythema, excoriation, or lesions.  Speculum exam reveals vaginal mucosa without lesions.  No bleeding or discharge.  Bimanual exam reveals cuff intact, no nodularity or masses.  Rectovaginal exam confirms these findings.  Laboratory & Radiologic Studies: None new  Assessment & Plan: Tina Ray is a 52 y.o. 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 Hereditary BARD1 mutation of uncertain significance.  Pathology revealed low risk factors for recurrence, therefore no adjuvant therapy is recommended according to NCCN guidelines.   Patient is doing well today and is NED on exam.  She is now just under 5 years out from treatment.  We discussed continuing yearly surveillance with her OB/GYN.   She is having multiple symptoms that I think are related to lack of estrogen.  She had previously been offered 1 replacement therapy after her surgery but had declined at that time.  She is now interested in starting hormone replacement therapy.  She has already cleared this with her cardiologist.  We discussed option of patch versus pill and she would like to start with the patch.  She would like to start with the lowest dose possible.  I sent a prescription in for Vivelle-Dot to her pharmacy and have asked her to call me in several weeks to let me know if she feels enough symptom relief with this.  22 minutes of total time was spent for this patient encounter, including preparation, face-to-face counseling with the patient and coordination of care, and documentation of the encounter.  Eugene Garnet, MD  Division of Gynecologic Oncology  Department of Obstetrics and Gynecology  [facility]

## 2022-07-22 DIAGNOSIS — M5412 Radiculopathy, cervical region: Secondary | ICD-10-CM | POA: Diagnosis not present

## 2022-08-03 DIAGNOSIS — R002 Palpitations: Secondary | ICD-10-CM | POA: Diagnosis not present

## 2022-08-03 DIAGNOSIS — Z23 Encounter for immunization: Secondary | ICD-10-CM | POA: Diagnosis not present

## 2022-08-03 DIAGNOSIS — R7303 Prediabetes: Secondary | ICD-10-CM | POA: Diagnosis not present

## 2022-08-03 DIAGNOSIS — Z0000 Encounter for general adult medical examination without abnormal findings: Secondary | ICD-10-CM | POA: Diagnosis not present

## 2022-08-03 DIAGNOSIS — E669 Obesity, unspecified: Secondary | ICD-10-CM | POA: Diagnosis not present

## 2022-08-03 DIAGNOSIS — E7800 Pure hypercholesterolemia, unspecified: Secondary | ICD-10-CM | POA: Diagnosis not present

## 2022-08-03 DIAGNOSIS — G629 Polyneuropathy, unspecified: Secondary | ICD-10-CM | POA: Diagnosis not present

## 2022-08-03 DIAGNOSIS — E559 Vitamin D deficiency, unspecified: Secondary | ICD-10-CM | POA: Diagnosis not present

## 2022-08-04 DIAGNOSIS — R509 Fever, unspecified: Secondary | ICD-10-CM | POA: Diagnosis not present

## 2022-08-04 DIAGNOSIS — T50A95A Adverse effect of other bacterial vaccines, initial encounter: Secondary | ICD-10-CM | POA: Diagnosis not present

## 2022-08-04 DIAGNOSIS — R0602 Shortness of breath: Secondary | ICD-10-CM | POA: Diagnosis not present

## 2022-08-05 DIAGNOSIS — T50Z95A Adverse effect of other vaccines and biological substances, initial encounter: Secondary | ICD-10-CM | POA: Diagnosis not present

## 2022-08-11 DIAGNOSIS — D2272 Melanocytic nevi of left lower limb, including hip: Secondary | ICD-10-CM | POA: Diagnosis not present

## 2022-08-11 DIAGNOSIS — D225 Melanocytic nevi of trunk: Secondary | ICD-10-CM | POA: Diagnosis not present

## 2022-08-11 DIAGNOSIS — D2262 Melanocytic nevi of left upper limb, including shoulder: Secondary | ICD-10-CM | POA: Diagnosis not present

## 2022-08-11 DIAGNOSIS — D2271 Melanocytic nevi of right lower limb, including hip: Secondary | ICD-10-CM | POA: Diagnosis not present

## 2022-08-25 DIAGNOSIS — R7303 Prediabetes: Secondary | ICD-10-CM | POA: Diagnosis not present

## 2022-08-25 DIAGNOSIS — I10 Essential (primary) hypertension: Secondary | ICD-10-CM | POA: Diagnosis not present

## 2022-08-25 DIAGNOSIS — E559 Vitamin D deficiency, unspecified: Secondary | ICD-10-CM | POA: Diagnosis not present

## 2022-09-15 DIAGNOSIS — R7303 Prediabetes: Secondary | ICD-10-CM | POA: Diagnosis not present

## 2022-09-15 DIAGNOSIS — I10 Essential (primary) hypertension: Secondary | ICD-10-CM | POA: Diagnosis not present

## 2022-09-15 DIAGNOSIS — E559 Vitamin D deficiency, unspecified: Secondary | ICD-10-CM | POA: Diagnosis not present

## 2022-10-27 DIAGNOSIS — I10 Essential (primary) hypertension: Secondary | ICD-10-CM | POA: Diagnosis not present

## 2022-10-27 DIAGNOSIS — R7303 Prediabetes: Secondary | ICD-10-CM | POA: Diagnosis not present

## 2022-10-27 DIAGNOSIS — E559 Vitamin D deficiency, unspecified: Secondary | ICD-10-CM | POA: Diagnosis not present

## 2022-11-10 NOTE — Progress Notes (Unsigned)
  Cardiology Office Note:   Date:  11/11/2022  ID:  Tina Ray, DOB 1970/11/18, MRN [id number] PCP: Verl Blalock  [facility] Providers Cardiologist:  Rollene Rotunda, MD {  History of Present Illness:   Tina Ray is a 52 y.o. female who is referred by Joycelyn Rua, MD for evaluation of palpitations.  She wore a monitor that demonstrated very rare atrial and ventricular ectopy.  Since I last saw her she continues to have occasional palpitations.  She is working with [employer] and has lost some weight although it seems to have plateaued.  However, they are telling her that some of this has turned to the muscle mass and she feels good about this.  She feels good about the lifestyle changes she is making.  She is having some shortness of breath walking up an incline.  She does not have any resting shortness of breath, PND or orthopnea.  Is not describing presyncope or syncope.  She has had some increased lower extremity edema.  Studies Reviewed:    EKG:   NA  Risk Assessment/Calculations:           Physical Exam:   VS:  BP 118/62 (BP Location: Left Arm, Patient Position: Sitting, Cuff Size: Normal)   Pulse 70   Ht 5\' 2"  (1.575 m)   Wt 230 lb 6.4 oz (104.5 kg)   LMP 07/04/2017 (Exact Date)   SpO2 97%   BMI 42.14 kg/m    Wt Readings from Last 3 Encounters:  11/11/22 230 lb 6.4 oz (104.5 kg)  07/16/22 227 lb (103 kg)  05/17/22 233 lb (105.7 kg)     GEN: Well nourished, well developed in no acute distress NECK: No JVD; No carotid bruits CARDIAC: RRR, no murmurs, rubs, gallops RESPIRATORY:  Clear to auscultation without rales, wheezing or rhonchi  ABDOMEN: Soft, non-tender, non-distended EXTREMITIES:  No edema; No deformity   ASSESSMENT AND PLAN:   Palpitations: These are well-managed.  No change in therapy.  HTN: Blood pressure is at target.  She will continue the meds as listed.   SOB: The patient has SOB and edema.   She has cardiovascular  risk factors.  I will get a coronary calcium score.  I will also order an echocardiogram.  Overweight:   For now she wants to hold off on any weight loss drugs and she is having this managed by the wellness clinic.   I will also defer to them consideration of Cushing's if she has difficult to control or weight loss and diabetes.  Her      Follow up me in one year.   Signed, Rollene Rotunda, MD

## 2022-11-11 ENCOUNTER — Ambulatory Visit: Payer: Federal, State, Local not specified - PPO | Attending: Cardiology | Admitting: Cardiology

## 2022-11-11 ENCOUNTER — Encounter: Payer: Self-pay | Admitting: Cardiology

## 2022-11-11 VITALS — BP 118/62 | HR 70 | Ht 62.0 in | Wt 230.4 lb

## 2022-11-11 DIAGNOSIS — M7989 Other specified soft tissue disorders: Secondary | ICD-10-CM | POA: Insufficient documentation

## 2022-11-11 DIAGNOSIS — I10 Essential (primary) hypertension: Secondary | ICD-10-CM | POA: Diagnosis not present

## 2022-11-11 DIAGNOSIS — R002 Palpitations: Secondary | ICD-10-CM

## 2022-11-11 DIAGNOSIS — R0602 Shortness of breath: Secondary | ICD-10-CM

## 2022-11-11 NOTE — Patient Instructions (Signed)
   Testing/Procedures:  Your physician has requested that you have an echocardiogram. Echocardiography is a painless test that uses sound waves to create images of your heart. It provides your doctor with information about the size and shape of your heart and how well your heart's chambers and valves are working. This procedure takes approximately one hour. There are no restrictions for this procedure. Please do NOT wear cologne, perfume, aftershave, or lotions (deodorant is allowed). Please arrive 15 minutes prior to your appointment time. [facility]  CORONARY CALCIUM SCORING CT SCAN AT THE [facility]   Follow-Up: At [facility], you and your health needs are our priority.  As part of our continuing mission to provide you with exceptional heart care, we have created designated Provider Care Teams.  These Care Teams include your primary Cardiologist (physician) and Advanced Practice Providers (APPs -  Physician Assistants and Nurse Practitioners) who all work together to provide you with the care you need, when you need it.  We recommend signing up for the patient portal called "MyChart".  Sign up information is provided on this After Visit Summary.  MyChart is used to connect with patients for Virtual Visits (Telemedicine).  Patients are able to view lab/test results, encounter notes, upcoming appointments, etc.  Non-urgent messages can be sent to your provider as well.   To learn more about what you can do with MyChart, go to [email or web address].    Your next appointment:   12 month(s)  Provider:   Rollene Rotunda, MD

## 2022-11-15 ENCOUNTER — Ambulatory Visit: Payer: Federal, State, Local not specified - PPO | Admitting: Cardiology

## 2022-11-29 DIAGNOSIS — F429 Obsessive-compulsive disorder, unspecified: Secondary | ICD-10-CM | POA: Diagnosis not present

## 2022-11-29 DIAGNOSIS — F4312 Post-traumatic stress disorder, chronic: Secondary | ICD-10-CM | POA: Diagnosis not present

## 2022-12-07 DIAGNOSIS — F4312 Post-traumatic stress disorder, chronic: Secondary | ICD-10-CM | POA: Diagnosis not present

## 2022-12-07 DIAGNOSIS — F429 Obsessive-compulsive disorder, unspecified: Secondary | ICD-10-CM | POA: Diagnosis not present

## 2022-12-13 DIAGNOSIS — F429 Obsessive-compulsive disorder, unspecified: Secondary | ICD-10-CM | POA: Diagnosis not present

## 2022-12-13 DIAGNOSIS — F4312 Post-traumatic stress disorder, chronic: Secondary | ICD-10-CM | POA: Diagnosis not present

## 2022-12-15 DIAGNOSIS — Z01419 Encounter for gynecological examination (general) (routine) without abnormal findings: Secondary | ICD-10-CM | POA: Diagnosis not present

## 2022-12-15 DIAGNOSIS — Z8742 Personal history of other diseases of the female genital tract: Secondary | ICD-10-CM | POA: Diagnosis not present

## 2022-12-16 DIAGNOSIS — I10 Essential (primary) hypertension: Secondary | ICD-10-CM | POA: Diagnosis not present

## 2022-12-16 DIAGNOSIS — E559 Vitamin D deficiency, unspecified: Secondary | ICD-10-CM | POA: Diagnosis not present

## 2022-12-16 DIAGNOSIS — R7303 Prediabetes: Secondary | ICD-10-CM | POA: Diagnosis not present

## 2022-12-20 DIAGNOSIS — F429 Obsessive-compulsive disorder, unspecified: Secondary | ICD-10-CM | POA: Diagnosis not present

## 2022-12-20 DIAGNOSIS — F4310 Post-traumatic stress disorder, unspecified: Secondary | ICD-10-CM | POA: Diagnosis not present

## 2022-12-22 ENCOUNTER — Ambulatory Visit (HOSPITAL_BASED_OUTPATIENT_CLINIC_OR_DEPARTMENT_OTHER)
Admission: RE | Admit: 2022-12-22 | Discharge: 2022-12-22 | Disposition: A | Discharge status: Home / Self Care | Payer: Federal, State, Local not specified - PPO | Source: Ambulatory Visit | Attending: Cardiology | Admitting: Cardiology

## 2022-12-22 ENCOUNTER — Ambulatory Visit (INDEPENDENT_AMBULATORY_CARE_PROVIDER_SITE_OTHER): Payer: Federal, State, Local not specified - PPO

## 2022-12-22 DIAGNOSIS — R0602 Shortness of breath: Secondary | ICD-10-CM | POA: Insufficient documentation

## 2022-12-22 LAB — ECHOCARDIOGRAM COMPLETE
Area-P 1/2: 4.31 cm2
Est EF: 55
S' Lateral: 3.03 cm

## 2022-12-27 ENCOUNTER — Encounter: Payer: Self-pay | Admitting: *Deleted

## 2022-12-27 ENCOUNTER — Ambulatory Visit (HOSPITAL_BASED_OUTPATIENT_CLINIC_OR_DEPARTMENT_OTHER): Payer: Federal, State, Local not specified - PPO

## 2022-12-27 ENCOUNTER — Other Ambulatory Visit (HOSPITAL_BASED_OUTPATIENT_CLINIC_OR_DEPARTMENT_OTHER): Payer: Federal, State, Local not specified - PPO

## 2022-12-27 DIAGNOSIS — F429 Obsessive-compulsive disorder, unspecified: Secondary | ICD-10-CM | POA: Diagnosis not present

## 2022-12-27 DIAGNOSIS — F4312 Post-traumatic stress disorder, chronic: Secondary | ICD-10-CM | POA: Diagnosis not present

## 2022-12-30 DIAGNOSIS — J069 Acute upper respiratory infection, unspecified: Secondary | ICD-10-CM | POA: Diagnosis not present

## 2022-12-30 DIAGNOSIS — Z03818 Encounter for observation for suspected exposure to other biological agents ruled out: Secondary | ICD-10-CM | POA: Diagnosis not present

## 2023-01-11 DIAGNOSIS — F4312 Post-traumatic stress disorder, chronic: Secondary | ICD-10-CM | POA: Diagnosis not present

## 2023-01-11 DIAGNOSIS — F429 Obsessive-compulsive disorder, unspecified: Secondary | ICD-10-CM | POA: Diagnosis not present

## 2023-01-12 DIAGNOSIS — H40013 Open angle with borderline findings, low risk, bilateral: Secondary | ICD-10-CM | POA: Diagnosis not present

## 2023-01-17 DIAGNOSIS — Z23 Encounter for immunization: Secondary | ICD-10-CM | POA: Diagnosis not present

## 2023-01-17 DIAGNOSIS — F429 Obsessive-compulsive disorder, unspecified: Secondary | ICD-10-CM | POA: Diagnosis not present

## 2023-01-17 DIAGNOSIS — F4312 Post-traumatic stress disorder, chronic: Secondary | ICD-10-CM | POA: Diagnosis not present

## 2023-01-24 DIAGNOSIS — F4312 Post-traumatic stress disorder, chronic: Secondary | ICD-10-CM | POA: Diagnosis not present

## 2023-01-24 DIAGNOSIS — F429 Obsessive-compulsive disorder, unspecified: Secondary | ICD-10-CM | POA: Diagnosis not present

## 2023-01-27 DIAGNOSIS — R7303 Prediabetes: Secondary | ICD-10-CM | POA: Diagnosis not present

## 2023-01-27 DIAGNOSIS — I10 Essential (primary) hypertension: Secondary | ICD-10-CM | POA: Diagnosis not present

## 2023-01-27 DIAGNOSIS — E559 Vitamin D deficiency, unspecified: Secondary | ICD-10-CM | POA: Diagnosis not present

## 2023-02-01 DIAGNOSIS — F429 Obsessive-compulsive disorder, unspecified: Secondary | ICD-10-CM | POA: Diagnosis not present

## 2023-02-01 DIAGNOSIS — F4312 Post-traumatic stress disorder, chronic: Secondary | ICD-10-CM | POA: Diagnosis not present

## 2023-02-09 DIAGNOSIS — F4312 Post-traumatic stress disorder, chronic: Secondary | ICD-10-CM | POA: Diagnosis not present

## 2023-02-09 DIAGNOSIS — F429 Obsessive-compulsive disorder, unspecified: Secondary | ICD-10-CM | POA: Diagnosis not present

## 2023-02-15 DIAGNOSIS — F429 Obsessive-compulsive disorder, unspecified: Secondary | ICD-10-CM | POA: Diagnosis not present

## 2023-02-15 DIAGNOSIS — F4312 Post-traumatic stress disorder, chronic: Secondary | ICD-10-CM | POA: Diagnosis not present

## 2023-02-16 DIAGNOSIS — D225 Melanocytic nevi of trunk: Secondary | ICD-10-CM | POA: Diagnosis not present

## 2023-02-16 DIAGNOSIS — D2262 Melanocytic nevi of left upper limb, including shoulder: Secondary | ICD-10-CM | POA: Diagnosis not present

## 2023-02-16 DIAGNOSIS — D2272 Melanocytic nevi of left lower limb, including hip: Secondary | ICD-10-CM | POA: Diagnosis not present

## 2023-02-16 DIAGNOSIS — D2271 Melanocytic nevi of right lower limb, including hip: Secondary | ICD-10-CM | POA: Diagnosis not present

## 2023-02-17 DIAGNOSIS — I10 Essential (primary) hypertension: Secondary | ICD-10-CM | POA: Diagnosis not present

## 2023-02-17 DIAGNOSIS — R7303 Prediabetes: Secondary | ICD-10-CM | POA: Diagnosis not present

## 2023-02-17 DIAGNOSIS — E559 Vitamin D deficiency, unspecified: Secondary | ICD-10-CM | POA: Diagnosis not present

## 2023-02-21 DIAGNOSIS — F4312 Post-traumatic stress disorder, chronic: Secondary | ICD-10-CM | POA: Diagnosis not present

## 2023-02-21 DIAGNOSIS — F429 Obsessive-compulsive disorder, unspecified: Secondary | ICD-10-CM | POA: Diagnosis not present

## 2023-02-24 DIAGNOSIS — I10 Essential (primary) hypertension: Secondary | ICD-10-CM | POA: Diagnosis not present

## 2023-02-24 DIAGNOSIS — R002 Palpitations: Secondary | ICD-10-CM | POA: Diagnosis not present

## 2023-02-24 DIAGNOSIS — R1011 Right upper quadrant pain: Secondary | ICD-10-CM | POA: Diagnosis not present

## 2023-02-24 DIAGNOSIS — R7303 Prediabetes: Secondary | ICD-10-CM | POA: Diagnosis not present

## 2023-03-01 DIAGNOSIS — F429 Obsessive-compulsive disorder, unspecified: Secondary | ICD-10-CM | POA: Diagnosis not present

## 2023-03-01 DIAGNOSIS — F4312 Post-traumatic stress disorder, chronic: Secondary | ICD-10-CM | POA: Diagnosis not present

## 2023-03-08 DIAGNOSIS — F429 Obsessive-compulsive disorder, unspecified: Secondary | ICD-10-CM | POA: Diagnosis not present

## 2023-03-08 DIAGNOSIS — F4312 Post-traumatic stress disorder, chronic: Secondary | ICD-10-CM | POA: Diagnosis not present

## 2023-03-09 ENCOUNTER — Other Ambulatory Visit: Payer: Self-pay | Admitting: Family Medicine

## 2023-03-09 DIAGNOSIS — R1011 Right upper quadrant pain: Secondary | ICD-10-CM

## 2023-03-15 ENCOUNTER — Ambulatory Visit
Admission: RE | Admit: 2023-03-15 | Discharge: 2023-03-15 | Disposition: A | Discharge status: Home / Self Care | Payer: Federal, State, Local not specified - PPO | Source: Ambulatory Visit | Attending: Family Medicine | Admitting: Family Medicine

## 2023-03-15 DIAGNOSIS — R1011 Right upper quadrant pain: Secondary | ICD-10-CM | POA: Diagnosis not present

## 2023-03-15 DIAGNOSIS — Z9049 Acquired absence of other specified parts of digestive tract: Secondary | ICD-10-CM | POA: Diagnosis not present

## 2023-03-21 DIAGNOSIS — F429 Obsessive-compulsive disorder, unspecified: Secondary | ICD-10-CM | POA: Diagnosis not present

## 2023-03-21 DIAGNOSIS — F4312 Post-traumatic stress disorder, chronic: Secondary | ICD-10-CM | POA: Diagnosis not present

## 2023-03-22 DIAGNOSIS — K760 Fatty (change of) liver, not elsewhere classified: Secondary | ICD-10-CM | POA: Diagnosis not present

## 2023-04-01 DIAGNOSIS — J069 Acute upper respiratory infection, unspecified: Secondary | ICD-10-CM | POA: Diagnosis not present

## 2023-04-04 DIAGNOSIS — F429 Obsessive-compulsive disorder, unspecified: Secondary | ICD-10-CM | POA: Diagnosis not present

## 2023-04-04 DIAGNOSIS — F4312 Post-traumatic stress disorder, chronic: Secondary | ICD-10-CM | POA: Diagnosis not present

## 2023-04-12 DIAGNOSIS — F429 Obsessive-compulsive disorder, unspecified: Secondary | ICD-10-CM | POA: Diagnosis not present

## 2023-04-12 DIAGNOSIS — F4312 Post-traumatic stress disorder, chronic: Secondary | ICD-10-CM | POA: Diagnosis not present

## 2023-04-28 DIAGNOSIS — R7303 Prediabetes: Secondary | ICD-10-CM | POA: Diagnosis not present

## 2023-04-28 DIAGNOSIS — E559 Vitamin D deficiency, unspecified: Secondary | ICD-10-CM | POA: Diagnosis not present

## 2023-04-28 DIAGNOSIS — I10 Essential (primary) hypertension: Secondary | ICD-10-CM | POA: Diagnosis not present

## 2023-04-29 DIAGNOSIS — F4312 Post-traumatic stress disorder, chronic: Secondary | ICD-10-CM | POA: Diagnosis not present

## 2023-04-29 DIAGNOSIS — F429 Obsessive-compulsive disorder, unspecified: Secondary | ICD-10-CM | POA: Diagnosis not present

## 2023-05-17 DIAGNOSIS — F4312 Post-traumatic stress disorder, chronic: Secondary | ICD-10-CM | POA: Diagnosis not present

## 2023-05-17 DIAGNOSIS — F429 Obsessive-compulsive disorder, unspecified: Secondary | ICD-10-CM | POA: Diagnosis not present

## 2023-05-20 DIAGNOSIS — H6123 Impacted cerumen, bilateral: Secondary | ICD-10-CM | POA: Diagnosis not present

## 2023-05-30 DIAGNOSIS — F429 Obsessive-compulsive disorder, unspecified: Secondary | ICD-10-CM | POA: Diagnosis not present

## 2023-05-30 DIAGNOSIS — F4312 Post-traumatic stress disorder, chronic: Secondary | ICD-10-CM | POA: Diagnosis not present

## 2023-06-14 DIAGNOSIS — F4312 Post-traumatic stress disorder, chronic: Secondary | ICD-10-CM | POA: Diagnosis not present

## 2023-06-14 DIAGNOSIS — F429 Obsessive-compulsive disorder, unspecified: Secondary | ICD-10-CM | POA: Diagnosis not present

## 2023-06-15 DIAGNOSIS — E559 Vitamin D deficiency, unspecified: Secondary | ICD-10-CM | POA: Diagnosis not present

## 2023-06-15 DIAGNOSIS — R7303 Prediabetes: Secondary | ICD-10-CM | POA: Diagnosis not present

## 2023-06-15 DIAGNOSIS — Z6839 Body mass index (BMI) 39.0-39.9, adult: Secondary | ICD-10-CM | POA: Diagnosis not present

## 2023-06-15 DIAGNOSIS — E538 Deficiency of other specified B group vitamins: Secondary | ICD-10-CM | POA: Diagnosis not present

## 2023-06-15 DIAGNOSIS — I10 Essential (primary) hypertension: Secondary | ICD-10-CM | POA: Diagnosis not present

## 2023-06-27 DIAGNOSIS — F4312 Post-traumatic stress disorder, chronic: Secondary | ICD-10-CM | POA: Diagnosis not present

## 2023-06-27 DIAGNOSIS — F429 Obsessive-compulsive disorder, unspecified: Secondary | ICD-10-CM | POA: Diagnosis not present

## 2023-07-12 DIAGNOSIS — F429 Obsessive-compulsive disorder, unspecified: Secondary | ICD-10-CM | POA: Diagnosis not present

## 2023-07-12 DIAGNOSIS — F4312 Post-traumatic stress disorder, chronic: Secondary | ICD-10-CM | POA: Diagnosis not present

## 2023-07-26 DIAGNOSIS — F4312 Post-traumatic stress disorder, chronic: Secondary | ICD-10-CM | POA: Diagnosis not present

## 2023-07-26 DIAGNOSIS — F429 Obsessive-compulsive disorder, unspecified: Secondary | ICD-10-CM | POA: Diagnosis not present

## 2023-07-27 DIAGNOSIS — R7303 Prediabetes: Secondary | ICD-10-CM | POA: Diagnosis not present

## 2023-07-27 DIAGNOSIS — E559 Vitamin D deficiency, unspecified: Secondary | ICD-10-CM | POA: Diagnosis not present

## 2023-07-27 DIAGNOSIS — E538 Deficiency of other specified B group vitamins: Secondary | ICD-10-CM | POA: Diagnosis not present

## 2023-07-27 DIAGNOSIS — I10 Essential (primary) hypertension: Secondary | ICD-10-CM | POA: Diagnosis not present

## 2023-08-01 DIAGNOSIS — M25473 Effusion, unspecified ankle: Secondary | ICD-10-CM | POA: Diagnosis not present

## 2023-08-01 DIAGNOSIS — Z6841 Body Mass Index (BMI) 40.0 and over, adult: Secondary | ICD-10-CM | POA: Diagnosis not present

## 2023-08-03 ENCOUNTER — Other Ambulatory Visit: Payer: Self-pay | Admitting: Cardiology

## 2023-08-09 DIAGNOSIS — F429 Obsessive-compulsive disorder, unspecified: Secondary | ICD-10-CM | POA: Diagnosis not present

## 2023-08-09 DIAGNOSIS — F4312 Post-traumatic stress disorder, chronic: Secondary | ICD-10-CM | POA: Diagnosis not present

## 2023-08-16 DIAGNOSIS — D2272 Melanocytic nevi of left lower limb, including hip: Secondary | ICD-10-CM | POA: Diagnosis not present

## 2023-08-16 DIAGNOSIS — D2271 Melanocytic nevi of right lower limb, including hip: Secondary | ICD-10-CM | POA: Diagnosis not present

## 2023-08-16 DIAGNOSIS — D225 Melanocytic nevi of trunk: Secondary | ICD-10-CM | POA: Diagnosis not present

## 2023-08-16 DIAGNOSIS — D2262 Melanocytic nevi of left upper limb, including shoulder: Secondary | ICD-10-CM | POA: Diagnosis not present

## 2023-08-18 DIAGNOSIS — R7303 Prediabetes: Secondary | ICD-10-CM | POA: Diagnosis not present

## 2023-08-18 DIAGNOSIS — E538 Deficiency of other specified B group vitamins: Secondary | ICD-10-CM | POA: Diagnosis not present

## 2023-08-18 DIAGNOSIS — I10 Essential (primary) hypertension: Secondary | ICD-10-CM | POA: Diagnosis not present

## 2023-08-18 DIAGNOSIS — E559 Vitamin D deficiency, unspecified: Secondary | ICD-10-CM | POA: Diagnosis not present

## 2023-08-23 DIAGNOSIS — F429 Obsessive-compulsive disorder, unspecified: Secondary | ICD-10-CM | POA: Diagnosis not present

## 2023-08-23 DIAGNOSIS — F4312 Post-traumatic stress disorder, chronic: Secondary | ICD-10-CM | POA: Diagnosis not present

## 2023-09-06 DIAGNOSIS — F4312 Post-traumatic stress disorder, chronic: Secondary | ICD-10-CM | POA: Diagnosis not present

## 2023-09-06 DIAGNOSIS — F429 Obsessive-compulsive disorder, unspecified: Secondary | ICD-10-CM | POA: Diagnosis not present

## 2023-09-14 DIAGNOSIS — Z0000 Encounter for general adult medical examination without abnormal findings: Secondary | ICD-10-CM | POA: Diagnosis not present

## 2023-09-26 DIAGNOSIS — F4312 Post-traumatic stress disorder, chronic: Secondary | ICD-10-CM | POA: Diagnosis not present

## 2023-09-26 DIAGNOSIS — F429 Obsessive-compulsive disorder, unspecified: Secondary | ICD-10-CM | POA: Diagnosis not present

## 2023-10-05 DIAGNOSIS — E538 Deficiency of other specified B group vitamins: Secondary | ICD-10-CM | POA: Diagnosis not present

## 2023-10-05 DIAGNOSIS — I10 Essential (primary) hypertension: Secondary | ICD-10-CM | POA: Diagnosis not present

## 2023-10-05 DIAGNOSIS — R7303 Prediabetes: Secondary | ICD-10-CM | POA: Diagnosis not present

## 2023-10-05 DIAGNOSIS — E559 Vitamin D deficiency, unspecified: Secondary | ICD-10-CM | POA: Diagnosis not present

## 2023-10-14 DIAGNOSIS — F4312 Post-traumatic stress disorder, chronic: Secondary | ICD-10-CM | POA: Diagnosis not present

## 2023-10-14 DIAGNOSIS — F429 Obsessive-compulsive disorder, unspecified: Secondary | ICD-10-CM | POA: Diagnosis not present

## 2023-10-25 DIAGNOSIS — F4312 Post-traumatic stress disorder, chronic: Secondary | ICD-10-CM | POA: Diagnosis not present

## 2023-10-25 DIAGNOSIS — F429 Obsessive-compulsive disorder, unspecified: Secondary | ICD-10-CM | POA: Diagnosis not present

## 2023-11-08 DIAGNOSIS — F429 Obsessive-compulsive disorder, unspecified: Secondary | ICD-10-CM | POA: Diagnosis not present

## 2023-11-08 DIAGNOSIS — F4312 Post-traumatic stress disorder, chronic: Secondary | ICD-10-CM | POA: Diagnosis not present

## 2023-11-16 DIAGNOSIS — E538 Deficiency of other specified B group vitamins: Secondary | ICD-10-CM | POA: Diagnosis not present

## 2023-11-16 DIAGNOSIS — E559 Vitamin D deficiency, unspecified: Secondary | ICD-10-CM | POA: Diagnosis not present

## 2023-11-16 DIAGNOSIS — R7303 Prediabetes: Secondary | ICD-10-CM | POA: Diagnosis not present

## 2023-11-16 DIAGNOSIS — I10 Essential (primary) hypertension: Secondary | ICD-10-CM | POA: Diagnosis not present

## 2023-11-25 DIAGNOSIS — F429 Obsessive-compulsive disorder, unspecified: Secondary | ICD-10-CM | POA: Diagnosis not present

## 2023-11-25 DIAGNOSIS — F4312 Post-traumatic stress disorder, chronic: Secondary | ICD-10-CM | POA: Diagnosis not present

## 2023-11-29 DIAGNOSIS — G4733 Obstructive sleep apnea (adult) (pediatric): Secondary | ICD-10-CM | POA: Diagnosis not present

## 2023-11-29 DIAGNOSIS — R002 Palpitations: Secondary | ICD-10-CM | POA: Diagnosis not present

## 2023-11-29 DIAGNOSIS — K130 Diseases of lips: Secondary | ICD-10-CM | POA: Diagnosis not present

## 2023-11-29 DIAGNOSIS — Z6841 Body Mass Index (BMI) 40.0 and over, adult: Secondary | ICD-10-CM | POA: Diagnosis not present

## 2023-12-07 DIAGNOSIS — F4312 Post-traumatic stress disorder, chronic: Secondary | ICD-10-CM | POA: Diagnosis not present

## 2023-12-07 DIAGNOSIS — F429 Obsessive-compulsive disorder, unspecified: Secondary | ICD-10-CM | POA: Diagnosis not present

## 2023-12-12 DIAGNOSIS — K130 Diseases of lips: Secondary | ICD-10-CM | POA: Diagnosis not present

## 2023-12-12 DIAGNOSIS — L718 Other rosacea: Secondary | ICD-10-CM | POA: Diagnosis not present

## 2023-12-20 DIAGNOSIS — Z01419 Encounter for gynecological examination (general) (routine) without abnormal findings: Secondary | ICD-10-CM | POA: Diagnosis not present

## 2023-12-20 DIAGNOSIS — N951 Menopausal and female climacteric states: Secondary | ICD-10-CM | POA: Diagnosis not present

## 2023-12-20 DIAGNOSIS — Z01411 Encounter for gynecological examination (general) (routine) with abnormal findings: Secondary | ICD-10-CM | POA: Diagnosis not present

## 2023-12-20 DIAGNOSIS — E559 Vitamin D deficiency, unspecified: Secondary | ICD-10-CM | POA: Diagnosis not present

## 2023-12-20 DIAGNOSIS — Z1231 Encounter for screening mammogram for malignant neoplasm of breast: Secondary | ICD-10-CM | POA: Diagnosis not present

## 2023-12-22 DIAGNOSIS — F1090 Alcohol use, unspecified, uncomplicated: Secondary | ICD-10-CM | POA: Diagnosis not present

## 2023-12-22 DIAGNOSIS — Z6841 Body Mass Index (BMI) 40.0 and over, adult: Secondary | ICD-10-CM | POA: Diagnosis not present

## 2023-12-22 DIAGNOSIS — F419 Anxiety disorder, unspecified: Secondary | ICD-10-CM | POA: Diagnosis not present

## 2023-12-22 DIAGNOSIS — R600 Localized edema: Secondary | ICD-10-CM | POA: Diagnosis not present

## 2023-12-23 ENCOUNTER — Other Ambulatory Visit (HOSPITAL_BASED_OUTPATIENT_CLINIC_OR_DEPARTMENT_OTHER): Payer: Self-pay | Admitting: Physician Assistant

## 2023-12-23 DIAGNOSIS — F1090 Alcohol use, unspecified, uncomplicated: Secondary | ICD-10-CM

## 2023-12-23 DIAGNOSIS — F429 Obsessive-compulsive disorder, unspecified: Secondary | ICD-10-CM | POA: Diagnosis not present

## 2023-12-23 DIAGNOSIS — F4312 Post-traumatic stress disorder, chronic: Secondary | ICD-10-CM | POA: Diagnosis not present

## 2023-12-28 DIAGNOSIS — H40013 Open angle with borderline findings, low risk, bilateral: Secondary | ICD-10-CM | POA: Diagnosis not present

## 2024-01-13 DIAGNOSIS — F429 Obsessive-compulsive disorder, unspecified: Secondary | ICD-10-CM | POA: Diagnosis not present

## 2024-01-13 DIAGNOSIS — F4312 Post-traumatic stress disorder, chronic: Secondary | ICD-10-CM | POA: Diagnosis not present

## 2024-01-17 DIAGNOSIS — G4730 Sleep apnea, unspecified: Secondary | ICD-10-CM | POA: Diagnosis not present

## 2024-01-17 DIAGNOSIS — E669 Obesity, unspecified: Secondary | ICD-10-CM | POA: Diagnosis not present

## 2024-01-19 DIAGNOSIS — I10 Essential (primary) hypertension: Secondary | ICD-10-CM | POA: Diagnosis not present

## 2024-01-19 DIAGNOSIS — E538 Deficiency of other specified B group vitamins: Secondary | ICD-10-CM | POA: Diagnosis not present

## 2024-01-19 DIAGNOSIS — R7303 Prediabetes: Secondary | ICD-10-CM | POA: Diagnosis not present

## 2024-01-19 DIAGNOSIS — E559 Vitamin D deficiency, unspecified: Secondary | ICD-10-CM | POA: Diagnosis not present

## 2024-01-24 DIAGNOSIS — F429 Obsessive-compulsive disorder, unspecified: Secondary | ICD-10-CM | POA: Diagnosis not present

## 2024-01-24 DIAGNOSIS — F4312 Post-traumatic stress disorder, chronic: Secondary | ICD-10-CM | POA: Diagnosis not present

## 2024-01-25 ENCOUNTER — Ambulatory Visit (HOSPITAL_BASED_OUTPATIENT_CLINIC_OR_DEPARTMENT_OTHER)
Admission: RE | Admit: 2024-01-25 | Discharge: 2024-01-25 | Disposition: A | Discharge status: Home / Self Care | Source: Ambulatory Visit | Attending: Physician Assistant | Admitting: Physician Assistant

## 2024-01-25 DIAGNOSIS — F1090 Alcohol use, unspecified, uncomplicated: Secondary | ICD-10-CM | POA: Insufficient documentation

## 2024-02-02 ENCOUNTER — Other Ambulatory Visit (HOSPITAL_BASED_OUTPATIENT_CLINIC_OR_DEPARTMENT_OTHER)

## 2024-02-07 DIAGNOSIS — F4312 Post-traumatic stress disorder, chronic: Secondary | ICD-10-CM | POA: Diagnosis not present

## 2024-02-07 DIAGNOSIS — F429 Obsessive-compulsive disorder, unspecified: Secondary | ICD-10-CM | POA: Diagnosis not present

## 2024-02-15 DIAGNOSIS — Z23 Encounter for immunization: Secondary | ICD-10-CM | POA: Diagnosis not present

## 2024-02-21 DIAGNOSIS — D225 Melanocytic nevi of trunk: Secondary | ICD-10-CM | POA: Diagnosis not present

## 2024-02-21 DIAGNOSIS — D2262 Melanocytic nevi of left upper limb, including shoulder: Secondary | ICD-10-CM | POA: Diagnosis not present

## 2024-02-21 DIAGNOSIS — D2272 Melanocytic nevi of left lower limb, including hip: Secondary | ICD-10-CM | POA: Diagnosis not present

## 2024-02-21 DIAGNOSIS — D2271 Melanocytic nevi of right lower limb, including hip: Secondary | ICD-10-CM | POA: Diagnosis not present

## 2024-02-23 ENCOUNTER — Ambulatory Visit: Admitting: Cardiology

## 2024-02-27 DIAGNOSIS — F4312 Post-traumatic stress disorder, chronic: Secondary | ICD-10-CM | POA: Diagnosis not present

## 2024-02-27 DIAGNOSIS — G4733 Obstructive sleep apnea (adult) (pediatric): Secondary | ICD-10-CM | POA: Diagnosis not present

## 2024-02-27 DIAGNOSIS — F429 Obsessive-compulsive disorder, unspecified: Secondary | ICD-10-CM | POA: Diagnosis not present

## 2024-02-28 DIAGNOSIS — G4733 Obstructive sleep apnea (adult) (pediatric): Secondary | ICD-10-CM | POA: Diagnosis not present

## 2024-03-07 NOTE — Progress Notes (Unsigned)
  Cardiology Office Note:   Date:  03/08/2024  ID:  Tina Ray, DOB [birth year], MRN [id number] PCP: Lanis Thresa JAYSON DEVONNA  [facility] Providers Cardiologist:  Lynwood Schilling, MD {  History of Present Illness:   Tina Ray is a 53 y.o. female who is referred by Nanci Senior, MD for evaluation of palpitations.  She wore a monitor that demonstrated very rare atrial and ventricular ectopy.  Since I last saw her she has had no new cardiovascular complaints.  She exercises now routinely.  She has become an avid walker.  She denies any chest pressure, neck or arm discomfort.  She denies any shortness of breath, PND or orthopnea.  She has had no palpitations, presyncope or syncope.  If she skips her Toprol by accident she will notice palpitations particularly at night and she might take a as needed Inderal .  She has had some mild lower extremity swelling but this seems to be baseline.  ROS: As stated in the HPI and negative for all other systems.  Studies Reviewed:    EKG:   EKG Interpretation Date/Time:  Thursday March 08 2024 12:05:53 EST Ventricular Rate:  70 PR Interval:  158 QRS Duration:  86 QT Interval:  420 QTC Calculation: 453 R Axis:   -36  Text Interpretation: Normal sinus rhythm Left axis deviation Nonspecific T wave abnormality no significant change since 05/17/22 Confirmed by Schilling Lynwood ([id number]) on 03/08/2024 12:19:38 PM    Risk Assessment/Calculations:              Physical Exam:   VS:  BP 128/75 (BP Location: Left Arm, Patient Position: Sitting)   Pulse 70   Wt 230 lb (104.3 kg)   LMP 07/04/2017 (Exact Date)   SpO2 97%   BMI 42.07 kg/m    Wt Readings from Last 3 Encounters:  03/08/24 230 lb (104.3 kg)  11/11/22 230 lb 6.4 oz (104.5 kg)  07/16/22 227 lb (103 kg)     GEN: Well nourished, well developed in no acute distress NECK: No JVD; No carotid bruits CARDIAC: RRR, no murmurs, rubs, gallops RESPIRATORY:  Clear to auscultation  without rales, wheezing or rhonchi  ABDOMEN: Soft, non-tender, non-distended EXTREMITIES:  Mild left greater than right edema; No deformity   ASSESSMENT AND PLAN:   Palpitations: The patient has no symptoms that are new.  She is well-managed with the meds as listed.  No change in therapy.    HTN: Blood pressure is at target.  She will continue the meds as listed.    SOB: The patient has SOB this has improved as she has increased her walking.  No further workup.  Overweight:    She is working with a weight loss program and again wants to hold off on medical therapy but might be considering this in the future.       Follow up with me at her request in one year.   Signed, Lynwood Schilling, MD

## 2024-03-08 ENCOUNTER — Encounter: Payer: Self-pay | Admitting: Cardiology

## 2024-03-08 ENCOUNTER — Ambulatory Visit: Attending: Cardiology | Admitting: Cardiology

## 2024-03-08 VITALS — BP 128/75 | HR 70 | Wt 230.0 lb

## 2024-03-08 DIAGNOSIS — I10 Essential (primary) hypertension: Secondary | ICD-10-CM | POA: Diagnosis not present

## 2024-03-08 DIAGNOSIS — R0602 Shortness of breath: Secondary | ICD-10-CM | POA: Diagnosis not present

## 2024-03-08 DIAGNOSIS — G4733 Obstructive sleep apnea (adult) (pediatric): Secondary | ICD-10-CM | POA: Diagnosis not present

## 2024-03-08 DIAGNOSIS — R002 Palpitations: Secondary | ICD-10-CM

## 2024-03-08 MED ORDER — PROPRANOLOL HCL 10 MG PO TABS: 10.0000 mg | ORAL_TABLET | Freq: Three times a day (TID) | ORAL | 3 refills | Status: AC | PRN

## 2024-03-08 MED ORDER — METOPROLOL SUCCINATE ER 25 MG PO TB24: 25.0000 mg | ORAL_TABLET | Freq: Every day | ORAL | 3 refills | Status: AC

## 2024-03-08 NOTE — Patient Instructions (Signed)
 Medication Instructions:  Refilled propranolol  Refilled metoprolol succinate *If you need a refill on your cardiac medications before your next appointment, please call your pharmacy*  Lab Work: NONE If you have labs (blood work) drawn today and your tests are completely normal, you will receive your results only by: MyChart Message (if you have MyChart) OR A paper copy in the mail If you have any lab test that is abnormal or we need to change your treatment, we will call you to review the results.  Testing/Procedures: NONE  Follow-Up: At [facility], you and your health needs are our priority.  As part of our continuing mission to provide you with exceptional heart care, our providers are all part of one team.  This team includes your primary Cardiologist (physician) and Advanced Practice Providers or APPs (Physician Assistants and Nurse Practitioners) who all work together to provide you with the care you need, when you need it.  Your next appointment:   1 year(s)  Provider:   Lynwood Schilling, MD    We recommend signing up for the patient portal called MyChart.  Sign up information is provided on this After Visit Summary.  MyChart is used to connect with patients for Virtual Visits (Telemedicine).  Patients are able to view lab/test results, encounter notes, upcoming appointments, etc.  Non-urgent messages can be sent to your provider as well.   To learn more about what you can do with MyChart, go to [email or web address].

## 2024-03-12 DIAGNOSIS — H9191 Unspecified hearing loss, right ear: Secondary | ICD-10-CM | POA: Diagnosis not present

## 2024-03-13 DIAGNOSIS — E538 Deficiency of other specified B group vitamins: Secondary | ICD-10-CM | POA: Diagnosis not present

## 2024-03-13 DIAGNOSIS — R7303 Prediabetes: Secondary | ICD-10-CM | POA: Diagnosis not present

## 2024-03-13 DIAGNOSIS — I10 Essential (primary) hypertension: Secondary | ICD-10-CM | POA: Diagnosis not present

## 2024-03-13 DIAGNOSIS — E7800 Pure hypercholesterolemia, unspecified: Secondary | ICD-10-CM | POA: Diagnosis not present

## 2024-03-13 DIAGNOSIS — E559 Vitamin D deficiency, unspecified: Secondary | ICD-10-CM | POA: Diagnosis not present

## 2024-03-20 DIAGNOSIS — H60331 Swimmer's ear, right ear: Secondary | ICD-10-CM | POA: Diagnosis not present

## 2024-03-20 DIAGNOSIS — H903 Sensorineural hearing loss, bilateral: Secondary | ICD-10-CM | POA: Diagnosis not present

## 2024-03-21 DIAGNOSIS — F429 Obsessive-compulsive disorder, unspecified: Secondary | ICD-10-CM | POA: Diagnosis not present

## 2024-03-21 DIAGNOSIS — F4312 Post-traumatic stress disorder, chronic: Secondary | ICD-10-CM | POA: Diagnosis not present

## 2024-03-22 DIAGNOSIS — H60333 Swimmer's ear, bilateral: Secondary | ICD-10-CM | POA: Diagnosis not present

## 2024-03-22 DIAGNOSIS — H903 Sensorineural hearing loss, bilateral: Secondary | ICD-10-CM | POA: Diagnosis not present

## 2024-03-22 DIAGNOSIS — G4733 Obstructive sleep apnea (adult) (pediatric): Secondary | ICD-10-CM | POA: Diagnosis not present

## 2024-04-02 ENCOUNTER — Other Ambulatory Visit: Payer: Self-pay

## 2024-04-02 DIAGNOSIS — M7989 Other specified soft tissue disorders: Secondary | ICD-10-CM

## 2024-04-04 DIAGNOSIS — G4733 Obstructive sleep apnea (adult) (pediatric): Secondary | ICD-10-CM | POA: Diagnosis not present

## 2024-04-25 NOTE — Progress Notes (Unsigned)
 "  Patient ID: Tina Ray, female   DOB: June 01, 1970, 54 y.o.   MRN: [id number]  Reason for Consult: No chief complaint on file.   Referred by Tina Thresa BROCKS, PA-C  Subjective:     HPI  Tina Ray is a 54 y.o. female who presents for evaluation of lower extremity swelling and spider veins Timeframe: Has had spider veins for many years, noticed worsening ankle swelling over the last few months. Symptoms: Denies pain, aching or heaviness Varicosities: Small in the posterior calves Previous wounds: No Previous DVT: No In compression: Yes  Past Medical History:  Diagnosis Date   Anxiety    Arthritis    Endometrial cancer (HCC)    Hypertension    IBS (irritable bowel syndrome)    Melanoma of skin (HCC) 11/11/2017   Peripheral vascular disease    Skin cancer (melanoma) (HCC)    1999   Sleep apnea    mild- mouth guard    Spinal stenosis    Family History  Problem Relation Age of Onset   Hypercholesterolemia Mother    Colon polyps Mother        a few every time, goes every 3 years   Endometriosis Mother    Diabetes Father    Multiple sclerosis Father    Prostate cancer Father 38       metastatic to bone   Colon polyps Father    Hypertension Maternal Grandmother    Diabetes Maternal Grandmother    Diabetes Maternal Grandfather    Melanoma Maternal Grandfather 67   Heart attack Maternal Grandfather    Breast cancer Paternal Grandmother 68       dx at very advanced stage.   Diabetes Maternal Aunt    Melanoma Maternal Aunt 55   Diabetes Maternal Uncle    Lung cancer Maternal Uncle    Throat cancer Maternal Uncle    Breast cancer Other        dx >50   Breast cancer Other        dx >50   Past Surgical History:  Procedure Laterality Date   BREAST BIOPSY Left 03/04/2016   BREAST BIOPSY Left 03/06/2015   CHOLECYSTECTOMY N/A 05/2020   DENTAL SURGERY     wisedom tooth extraction   LYMPH NODE BIOPSY N/A 09/12/2017   Procedure: SENTINEL LYMPH NODE BIOPSY;   Surgeon: Eloy Herring, MD;  Location: [facility];  Service: Gynecology;  Laterality: N/A;   ROBOTIC ASSISTED TOTAL HYSTERECTOMY WITH BILATERAL SALPINGO OOPHERECTOMY Bilateral 09/12/2017   Procedure: XI ROBOTIC ASSISTED TOTAL LAPAROSCOPIC  HYSTERECTOMY WITH BILATERAL SALPINGO OOPHORECTOMY;  Surgeon: Eloy Herring, MD;  Location: [facility];  Service: Gynecology;  Laterality: Bilateral;    Short Social History:  Social History   Tobacco Use   Smoking status: Never   Smokeless tobacco: Never  Substance Use Topics   Alcohol use: Yes    Alcohol/week: 3.0 standard drinks of alcohol    Types: 3 Glasses of wine per week    Comment: 2 or 3 glasses of wine daily    Allergies[1]  Current Outpatient Medications  Medication Sig Dispense Refill   acyclovir  (ZOVIRAX ) 400 MG tablet Take 400 mg by mouth 2 (two) times daily as needed (outbreak).  0   Cholecalciferol (VITAMIN D3) 30 MCG/15ML LIQD Take by mouth. 2,000 IU daily     Cyanocobalamin  (B-12) 2500 MCG SUBL Place 1 tablet under the tongue daily.     cyclobenzaprine  (FLEXERIL ) 10 MG tablet Take 10 mg by mouth  3 (three) times daily as needed for muscle spasms.     estradiol  (VIVELLE -DOT) 0.025 MG/24HR Place 1 patch onto the skin 2 (two) times a week. 8 patch 12   gabapentin  (NEURONTIN ) 100 MG capsule Take 100 mg by mouth at bedtime. (Patient not taking: Reported on 03/08/2024)     lisinopril -hydrochlorothiazide  (PRINZIDE ,ZESTORETIC ) 20-25 MG tablet Take 1 tablet by mouth daily.  1   Menaquinone-7 (VITAMIN K2 PO) Take 45 mg by mouth daily.     metoprolol  succinate (TOPROL -XL) 25 MG 24 hr tablet Take 1 tablet (25 mg total) by mouth daily. 90 tablet 3   Omega-3 Fatty Acids (FISH OIL) 500 MG CAPS Take 500 mg by mouth daily.     propranolol  (INDERAL ) 10 MG tablet Take 1 tablet (10 mg total) by mouth every 8 (eight) hours as needed (MAY TAKE EVERY 8 HOURS UP TO 3 TIMES DAILY AS NEEDED FOR PALPITATIONS). 90 tablet 3   Vitamin D-Vitamin K (VITAMIN K2-VITAMIN D3 PO)  Take by mouth daily.     No current facility-administered medications for this visit.   Facility-Administered Medications Ordered in Other Visits  Medication Dose Route Frequency Provider Last Rate Last Admin   indocyanine green  (IC-GREEN ) injection 2.5 mg  2.5 mg Intravenous Once Tanda Locus, MD        REVIEW OF SYSTEMS All other systems were reviewed and are negative    Objective:  Objective   There were no vitals filed for this visit. There is no height or weight on file to calculate BMI.  Physical Exam General: no acute distress Cardiac: hemodynamically stable Extremities: 1+ edema from ankle to mid shin bilaterally, right slightly greater than left.  Clusters of telangiectasias on thighs and shins.  Small palpable varicose veins in the popliteal region and posterior calf. Vascular:   Right: palpable DP, PT  Left: palpable DP, PT   Data: Reflux study Venous Reflux Times  +--------------+---------+------+-----------+------------+-------------+  RIGHT        Reflux NoRefluxReflux TimeDiameter cmsComments                               Yes                                        +--------------+---------+------+-----------+------------+-------------+  CFV                    yes   >1 second                            +--------------+---------+------+-----------+------------+-------------+  FV mid        no                                                   +--------------+---------+------+-----------+------------+-------------+  Popliteal    no                                                   +--------------+---------+------+-----------+------------+-------------+  GSV at [facility]  yes    >500 ms      0.9                    +--------------+---------+------+-----------+------------+-------------+  GSV prox thighno                            0.44                    +--------------+---------+------+-----------+------------+-------------+  GSV mid thigh                                       out of fascia  +--------------+---------+------+-----------+------------+-------------+  GSV dist thigh                                      out of fascia  +--------------+---------+------+-----------+------------+-------------+  GSV at knee                                         out of fascia  +--------------+---------+------+-----------+------------+-------------+  GSV prox calf                                       out of fascia  +--------------+---------+------+-----------+------------+-------------+  GSV mid calf  no                            0.21                   +--------------+---------+------+-----------+------------+-------------+  GSV dist calf no                            0.2                    +--------------+---------+------+-----------+------------+-------------+  SSV at [facility]              yes    >500 ms      0.3                    +--------------+---------+------+-----------+------------+-------------+  SSV prox calf                               0.2                    +--------------+---------+------+-----------+------------+-------------+       Assessment/Plan:     DARRAH DREDGE is a 54 y.o. female with chronic venous insufficiency with C3 disease and reflux noted in common femoral vein and saphenofemoral junction I explained the foundation of CVI treatment of compression and elevation I recommended medical grade graduated compression stockings and intermittent leg elevation We also discussed that many patients find symptom improvement with exercise and weight loss. Given healthy veins information sheet, she will continue with compression elevation and exercise and follow-up as needed      Norman GORMAN Serve MD [facility]     [1]   Allergies Allergen Reactions   Erythromycin  Base Hives   Sulfa Antibiotics Hives   Sulfamethoxazole-Trimethoprim     Other reaction(s): Hives   "

## 2024-04-27 ENCOUNTER — Ambulatory Visit (HOSPITAL_COMMUNITY)
Admission: RE | Admit: 2024-04-27 | Discharge: 2024-04-27 | Disposition: A | Source: Ambulatory Visit | Attending: Vascular Surgery

## 2024-04-27 ENCOUNTER — Ambulatory Visit: Admitting: Vascular Surgery

## 2024-04-27 ENCOUNTER — Encounter: Payer: Self-pay | Admitting: Vascular Surgery

## 2024-04-27 VITALS — BP 130/79 | HR 72 | Temp 98.0°F | Resp 20 | Ht 62.0 in | Wt 227.0 lb

## 2024-04-27 DIAGNOSIS — M7989 Other specified soft tissue disorders: Secondary | ICD-10-CM | POA: Insufficient documentation

## 2024-04-27 DIAGNOSIS — I872 Venous insufficiency (chronic) (peripheral): Secondary | ICD-10-CM | POA: Insufficient documentation
